# Patient Record
Sex: Male | Born: 1965 | Race: White | Hispanic: No | Marital: Married | Smoking: Never smoker
Health system: Southern US, Community
[De-identification: ages and names within clinical notes are randomized; demographics above are authoritative.]

## PROBLEM LIST (undated history)

## (undated) DIAGNOSIS — M1990 Unspecified osteoarthritis, unspecified site: Secondary | ICD-10-CM

## (undated) DIAGNOSIS — R569 Unspecified convulsions: Secondary | ICD-10-CM

## (undated) DIAGNOSIS — G4700 Insomnia, unspecified: Secondary | ICD-10-CM

## (undated) DIAGNOSIS — G4733 Obstructive sleep apnea (adult) (pediatric): Secondary | ICD-10-CM

## (undated) DIAGNOSIS — E785 Hyperlipidemia, unspecified: Secondary | ICD-10-CM

## (undated) DIAGNOSIS — G20A1 Parkinson's disease without dyskinesia, without mention of fluctuations: Secondary | ICD-10-CM

## (undated) DIAGNOSIS — R590 Localized enlarged lymph nodes: Secondary | ICD-10-CM

## (undated) DIAGNOSIS — G20 Parkinson's disease: Secondary | ICD-10-CM

## (undated) DIAGNOSIS — F32A Depression, unspecified: Secondary | ICD-10-CM

## (undated) DIAGNOSIS — F419 Anxiety disorder, unspecified: Secondary | ICD-10-CM

## (undated) DIAGNOSIS — M5430 Sciatica, unspecified side: Secondary | ICD-10-CM

## (undated) DIAGNOSIS — M4310 Spondylolisthesis, site unspecified: Secondary | ICD-10-CM

## (undated) DIAGNOSIS — M4800 Spinal stenosis, site unspecified: Secondary | ICD-10-CM

## (undated) DIAGNOSIS — F329 Major depressive disorder, single episode, unspecified: Secondary | ICD-10-CM

## (undated) HISTORY — PX: BACK SURGERY: SHX140

## (undated) HISTORY — PX: KNEE ARTHROSCOPY: SUR90

## (undated) HISTORY — PX: TONSILECTOMY/ADENOIDECTOMY WITH MYRINGOTOMY: SHX6125

---

## 1998-08-06 ENCOUNTER — Ambulatory Visit (HOSPITAL_BASED_OUTPATIENT_CLINIC_OR_DEPARTMENT_OTHER): Admission: RE | Admit: 1998-08-06 | Discharge: 1998-08-06 | Payer: Self-pay | Admitting: Orthopedic Surgery

## 2000-06-15 ENCOUNTER — Encounter: Payer: Self-pay | Admitting: Neurosurgery

## 2000-06-17 ENCOUNTER — Ambulatory Visit (HOSPITAL_COMMUNITY): Admission: RE | Admit: 2000-06-17 | Discharge: 2000-06-17 | Payer: Self-pay | Admitting: Neurosurgery

## 2000-12-12 ENCOUNTER — Ambulatory Visit (HOSPITAL_COMMUNITY): Admission: RE | Admit: 2000-12-12 | Discharge: 2000-12-12 | Payer: Self-pay | Admitting: Neurosurgery

## 2000-12-12 ENCOUNTER — Encounter: Payer: Self-pay | Admitting: Neurosurgery

## 2001-01-13 ENCOUNTER — Ambulatory Visit (HOSPITAL_COMMUNITY): Admission: RE | Admit: 2001-01-13 | Discharge: 2001-01-14 | Payer: Self-pay | Admitting: Neurosurgery

## 2001-09-01 ENCOUNTER — Ambulatory Visit (HOSPITAL_BASED_OUTPATIENT_CLINIC_OR_DEPARTMENT_OTHER): Admission: RE | Admit: 2001-09-01 | Discharge: 2001-09-01 | Payer: Self-pay | Admitting: Orthopedic Surgery

## 2001-12-06 ENCOUNTER — Ambulatory Visit (HOSPITAL_BASED_OUTPATIENT_CLINIC_OR_DEPARTMENT_OTHER): Admission: RE | Admit: 2001-12-06 | Discharge: 2001-12-06 | Payer: Self-pay | Admitting: Orthopedic Surgery

## 2001-12-10 ENCOUNTER — Encounter: Payer: Self-pay | Admitting: Neurosurgery

## 2001-12-14 ENCOUNTER — Ambulatory Visit (HOSPITAL_COMMUNITY): Admission: RE | Admit: 2001-12-14 | Discharge: 2001-12-14 | Payer: Self-pay | Admitting: Neurosurgery

## 2002-04-27 ENCOUNTER — Encounter: Payer: Self-pay | Admitting: Emergency Medicine

## 2002-04-27 ENCOUNTER — Emergency Department (HOSPITAL_COMMUNITY): Admission: EM | Admit: 2002-04-27 | Discharge: 2002-04-28 | Payer: Self-pay | Admitting: Emergency Medicine

## 2002-04-28 ENCOUNTER — Encounter: Payer: Self-pay | Admitting: Emergency Medicine

## 2002-07-11 ENCOUNTER — Encounter: Admission: RE | Admit: 2002-07-11 | Discharge: 2002-07-11 | Payer: Self-pay | Admitting: Infectious Diseases

## 2002-07-14 ENCOUNTER — Encounter: Admission: RE | Admit: 2002-07-14 | Discharge: 2002-07-14 | Payer: Self-pay | Admitting: Infectious Diseases

## 2002-07-14 ENCOUNTER — Ambulatory Visit (HOSPITAL_COMMUNITY): Admission: RE | Admit: 2002-07-14 | Discharge: 2002-07-14 | Payer: Self-pay | Admitting: Infectious Diseases

## 2002-07-14 ENCOUNTER — Encounter: Payer: Self-pay | Admitting: Infectious Diseases

## 2002-07-19 ENCOUNTER — Encounter: Payer: Self-pay | Admitting: Internal Medicine

## 2002-07-19 ENCOUNTER — Encounter: Admission: RE | Admit: 2002-07-19 | Discharge: 2002-07-19 | Payer: Self-pay | Admitting: Internal Medicine

## 2002-07-21 ENCOUNTER — Encounter: Admission: RE | Admit: 2002-07-21 | Discharge: 2002-07-21 | Payer: Self-pay | Admitting: Internal Medicine

## 2002-07-21 ENCOUNTER — Encounter: Payer: Self-pay | Admitting: Internal Medicine

## 2002-08-15 ENCOUNTER — Encounter: Admission: RE | Admit: 2002-08-15 | Discharge: 2002-08-15 | Payer: Self-pay | Admitting: Infectious Diseases

## 2005-10-10 ENCOUNTER — Ambulatory Visit: Payer: Self-pay | Admitting: Family Medicine

## 2006-03-05 ENCOUNTER — Inpatient Hospital Stay: Payer: Self-pay | Admitting: Otolaryngology

## 2006-10-01 ENCOUNTER — Ambulatory Visit: Payer: Self-pay | Admitting: Otolaryngology

## 2006-10-13 ENCOUNTER — Ambulatory Visit: Payer: Self-pay | Admitting: Otolaryngology

## 2006-11-04 ENCOUNTER — Ambulatory Visit: Payer: Self-pay | Admitting: Otolaryngology

## 2011-03-11 ENCOUNTER — Other Ambulatory Visit: Payer: Self-pay | Admitting: Neurosurgery

## 2011-03-14 ENCOUNTER — Ambulatory Visit
Admission: RE | Admit: 2011-03-14 | Discharge: 2011-03-14 | Disposition: A | Discharge status: Home / Self Care | Payer: BC Managed Care – PPO | Source: Ambulatory Visit | Attending: Neurosurgery | Admitting: Neurosurgery

## 2011-03-14 MED ORDER — IOHEXOL 300 MG/ML  SOLN: 125.0000 mL | Freq: Once | INTRAMUSCULAR | Status: AC | PRN

## 2011-12-08 ENCOUNTER — Ambulatory Visit: Payer: Self-pay | Admitting: Family Medicine

## 2012-10-07 ENCOUNTER — Ambulatory Visit: Payer: Self-pay | Admitting: Neurology

## 2012-10-11 ENCOUNTER — Ambulatory Visit: Payer: Self-pay | Admitting: Neurology

## 2014-01-23 ENCOUNTER — Ambulatory Visit: Payer: Self-pay | Admitting: Otolaryngology

## 2014-01-23 IMAGING — RF DG BARIUM SWALLOW
7 series · 14 of 24 positions shown · non-contrast
Comparison: None.

CLINICAL DATA: Chronic cough.

EXAM:
ESOPHOGRAM/BARIUM SWALLOW
TECHNIQUE: Combined double contrast and single contrast examination performed
using effervescent crystals, thick barium liquid, and thin barium
liquid.
FLUOROSCOPY TIME:  1 min and 42 seconds.

[Series 1: fluoro_barium 2fps_bw · 0.17mm/px · 2 of 24 frames shown (1 of 6)]
[frame 4/24]
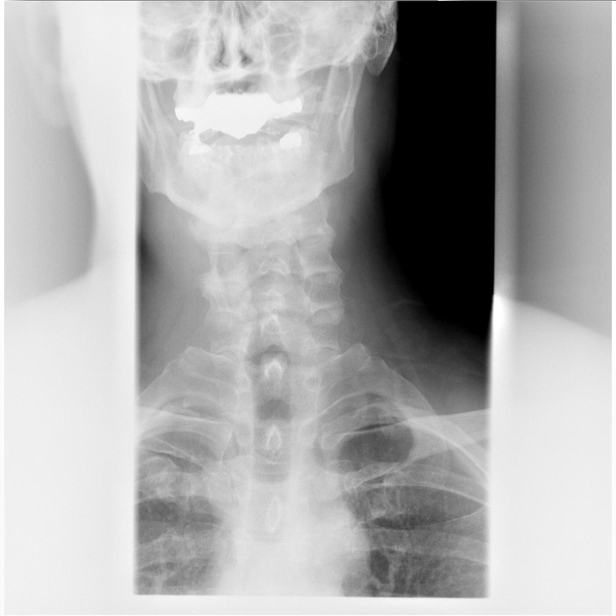
[frame 21/24]
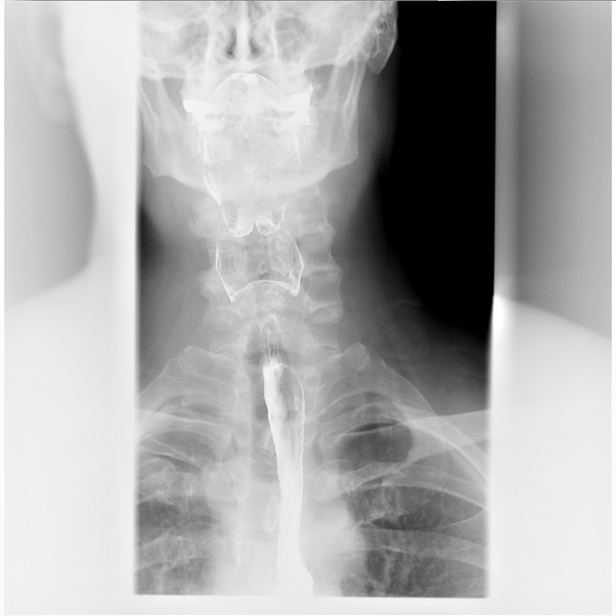

[Series 2: fluoro_barium 2fps_bw · 0.17mm/px · 1 of 13 frames shown (2 of 6)]
[frame 7/13]
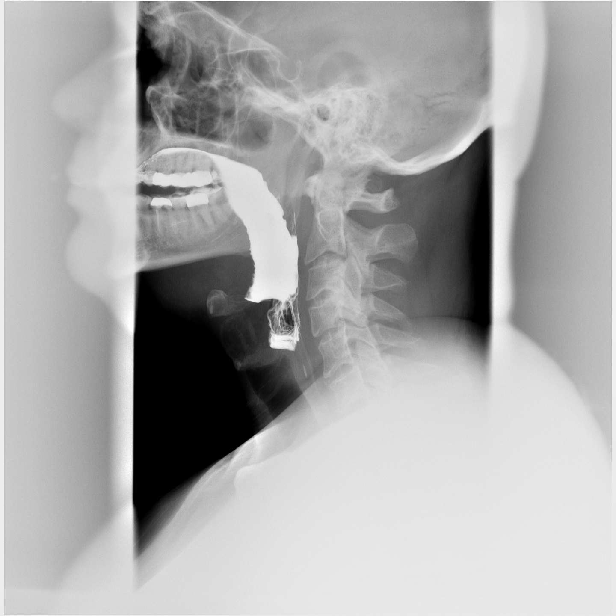

[Series 3: fluoro_barium 2fps_bw · 0.17mm/px · 2 of 13 frames shown (3 of 6)]
[frame 2/13]
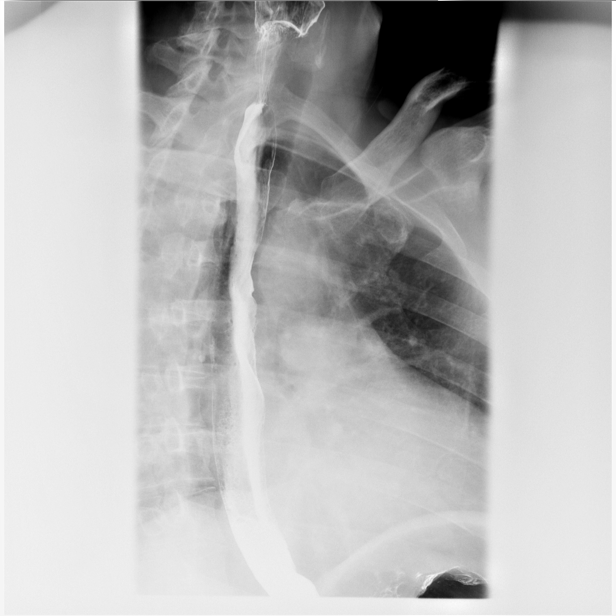
[frame 6/13]
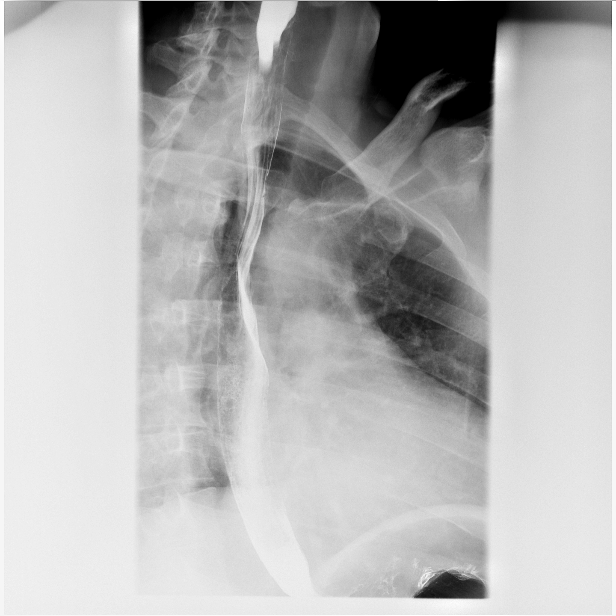

[Series 4: fluoro_barium 2fps_bw · 0.17mm/px · 2 of 18 frames shown (4 of 6)]
[frame 3/18]
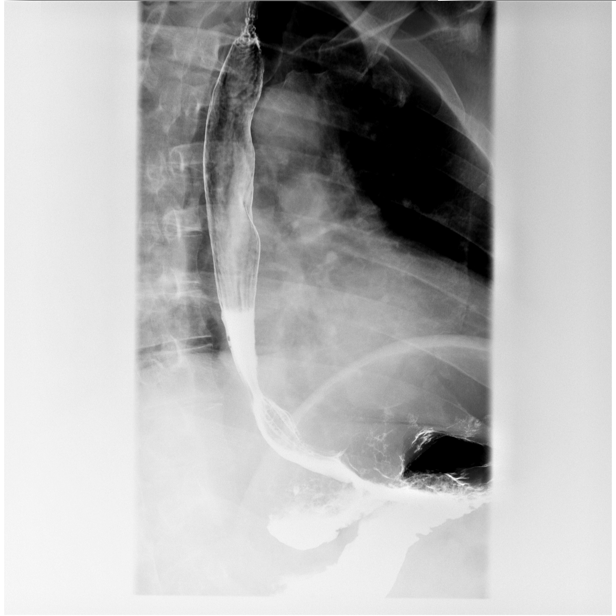
[frame 16/18]
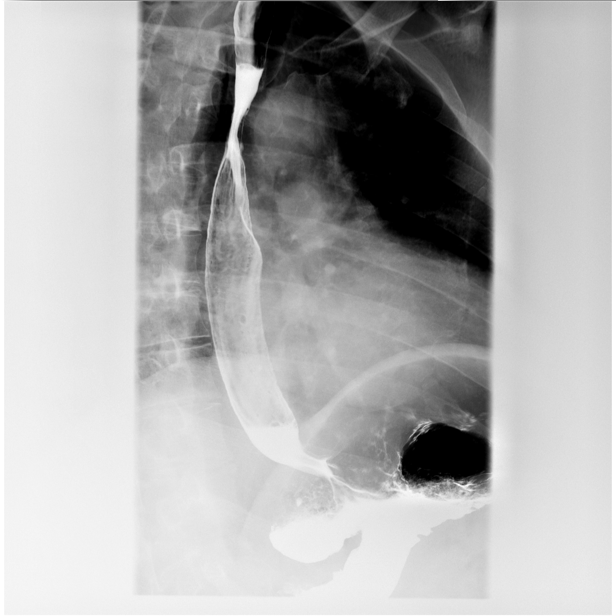

[Series 5: cp_standard · 0.30mm/px · 5 of 11 slices shown]
[im 1/11]
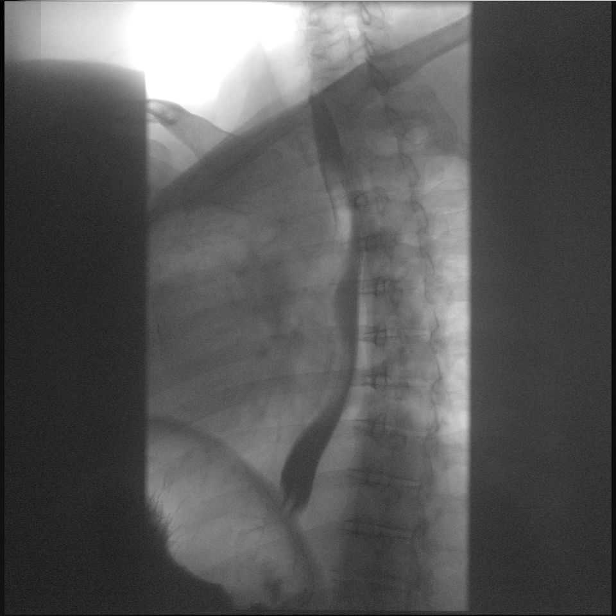
[im 3/11]
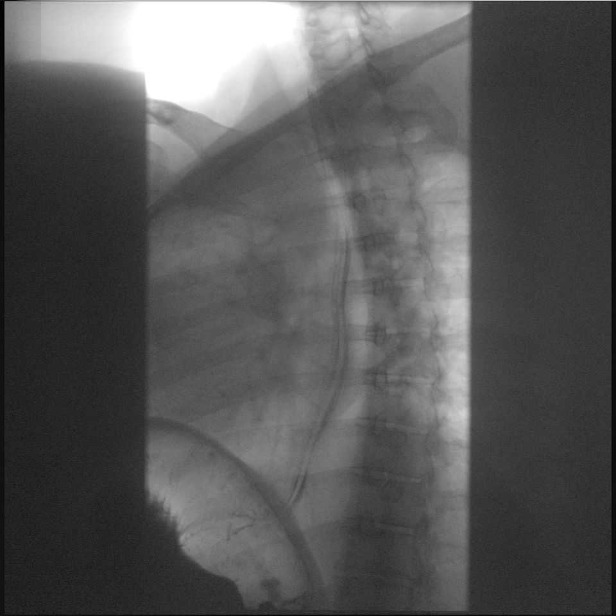
[im 6/11]
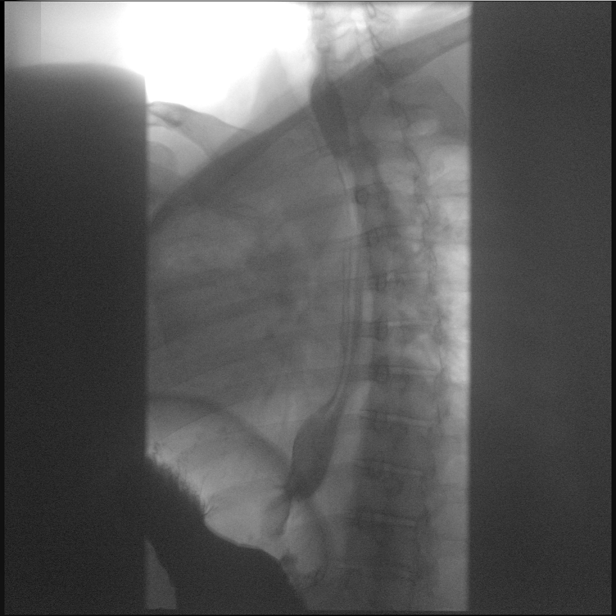
[im 8/11]
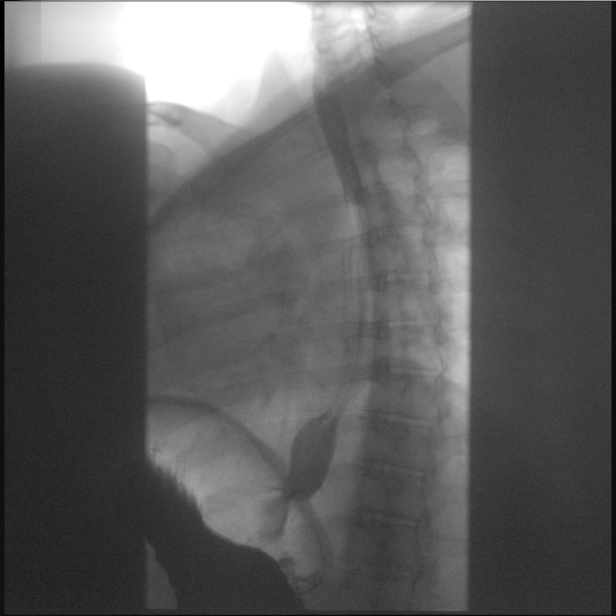
[im 10/11]
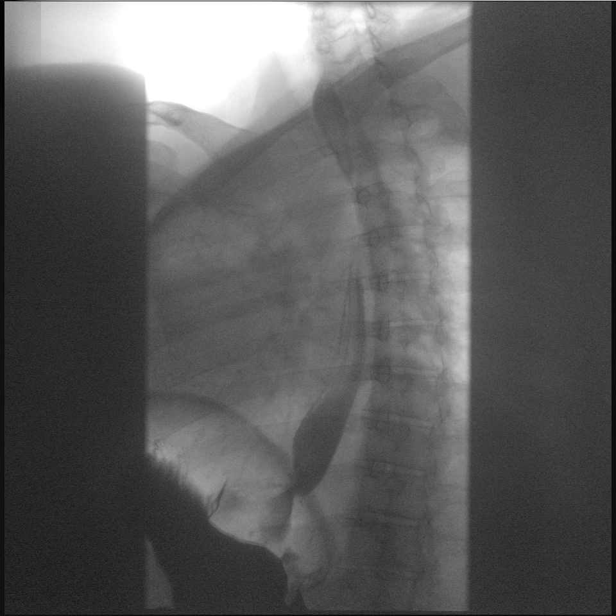

[Series 13: fluoro_barium 2fps_bw · 0.20mm/px · 1 of 2 frames shown (5 of 6)]
[frame 1/2]
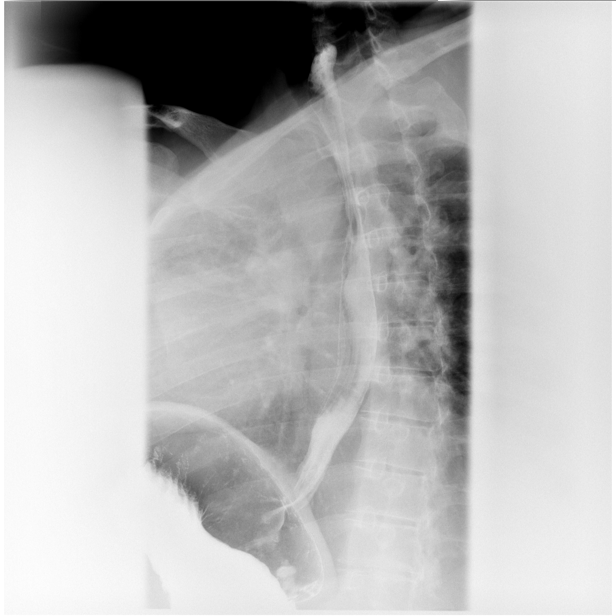

[Series 14: fluoro_barium 2fps_bw · 0.20mm/px · 1 of 2 frames shown (6 of 6)]
[frame 2/2]
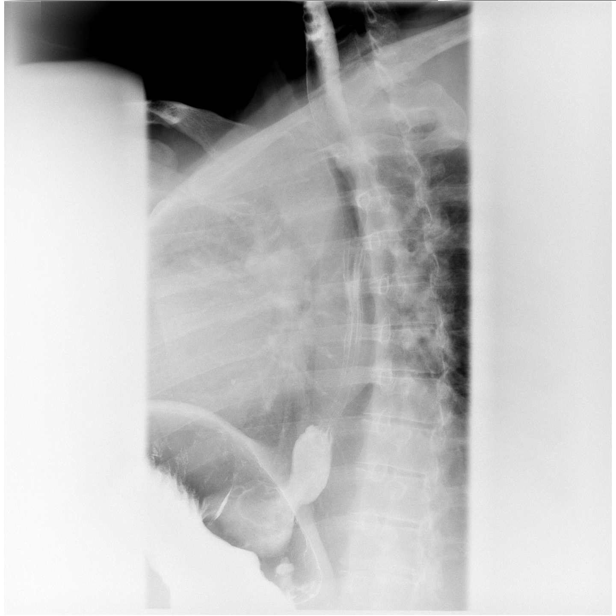

[14 of 24 positions shown; findings below may reference images not displayed]

FINDINGS: Frontal and lateral views of the hypopharynx while swallowing are
normal. Specifically, no evidence for laryngeal penetration or
aspiration. Double contrast imaging of the esophagus is normal
without evidence for diverticulum, mass lesion, mucosal ulceration,
or wall irregularity.

Assessment of esophageal motility shows disruption of primary
peristalsis on multiple swallows, consistent with nonspecific
esophageal motility disorder. No evidence for esophageal fold
thickening to suggest esophagitis.

13 mm barium tablet passes readily into the stomach when taken with
water.
IMPRESSION: Nonspecific esophageal motility disorder. Otherwise unremarkable
exam.

## 2014-02-07 ENCOUNTER — Ambulatory Visit: Payer: Self-pay | Admitting: Otolaryngology

## 2015-01-16 ENCOUNTER — Ambulatory Visit (INDEPENDENT_AMBULATORY_CARE_PROVIDER_SITE_OTHER): Payer: BC Managed Care – PPO | Admitting: Psychology

## 2015-01-16 DIAGNOSIS — F4323 Adjustment disorder with mixed anxiety and depressed mood: Secondary | ICD-10-CM

## 2015-01-24 ENCOUNTER — Ambulatory Visit (INDEPENDENT_AMBULATORY_CARE_PROVIDER_SITE_OTHER): Payer: BC Managed Care – PPO | Admitting: Psychology

## 2015-01-24 DIAGNOSIS — F4323 Adjustment disorder with mixed anxiety and depressed mood: Secondary | ICD-10-CM | POA: Diagnosis not present

## 2015-02-07 ENCOUNTER — Ambulatory Visit: Payer: BC Managed Care – PPO | Admitting: Psychology

## 2015-12-26 ENCOUNTER — Ambulatory Visit: Payer: Self-pay | Admitting: Hematology and Oncology

## 2017-07-08 ENCOUNTER — Ambulatory Visit: Payer: BC Managed Care – PPO | Attending: Neurology

## 2017-07-08 DIAGNOSIS — G4733 Obstructive sleep apnea (adult) (pediatric): Secondary | ICD-10-CM | POA: Insufficient documentation

## 2017-08-03 DIAGNOSIS — I208 Other forms of angina pectoris: Secondary | ICD-10-CM | POA: Diagnosis present

## 2017-08-05 ENCOUNTER — Encounter
Admission: RE | Disposition: A | Discharge status: Home / Self Care | Payer: Self-pay | Source: Ambulatory Visit | Attending: Internal Medicine

## 2017-08-05 ENCOUNTER — Encounter: Payer: Self-pay | Admitting: *Deleted

## 2017-08-05 ENCOUNTER — Ambulatory Visit
Admission: RE | Admit: 2017-08-05 | Discharge: 2017-08-05 | Disposition: A | Discharge status: Home / Self Care | Payer: BC Managed Care – PPO | Source: Ambulatory Visit | Attending: Internal Medicine | Admitting: Internal Medicine

## 2017-08-05 DIAGNOSIS — E669 Obesity, unspecified: Secondary | ICD-10-CM | POA: Insufficient documentation

## 2017-08-05 DIAGNOSIS — M48062 Spinal stenosis, lumbar region with neurogenic claudication: Secondary | ICD-10-CM | POA: Diagnosis not present

## 2017-08-05 DIAGNOSIS — F419 Anxiety disorder, unspecified: Secondary | ICD-10-CM | POA: Insufficient documentation

## 2017-08-05 DIAGNOSIS — R079 Chest pain, unspecified: Secondary | ICD-10-CM

## 2017-08-05 DIAGNOSIS — E7800 Pure hypercholesterolemia, unspecified: Secondary | ICD-10-CM | POA: Insufficient documentation

## 2017-08-05 DIAGNOSIS — G4733 Obstructive sleep apnea (adult) (pediatric): Secondary | ICD-10-CM | POA: Insufficient documentation

## 2017-08-05 DIAGNOSIS — R569 Unspecified convulsions: Secondary | ICD-10-CM | POA: Insufficient documentation

## 2017-08-05 DIAGNOSIS — M1990 Unspecified osteoarthritis, unspecified site: Secondary | ICD-10-CM | POA: Diagnosis not present

## 2017-08-05 DIAGNOSIS — F329 Major depressive disorder, single episode, unspecified: Secondary | ICD-10-CM | POA: Insufficient documentation

## 2017-08-05 DIAGNOSIS — I208 Other forms of angina pectoris: Secondary | ICD-10-CM | POA: Diagnosis present

## 2017-08-05 DIAGNOSIS — Z6832 Body mass index (BMI) 32.0-32.9, adult: Secondary | ICD-10-CM | POA: Diagnosis not present

## 2017-08-05 DIAGNOSIS — R0789 Other chest pain: Secondary | ICD-10-CM | POA: Diagnosis not present

## 2017-08-05 DIAGNOSIS — Z8249 Family history of ischemic heart disease and other diseases of the circulatory system: Secondary | ICD-10-CM | POA: Diagnosis not present

## 2017-08-05 HISTORY — DX: Sciatica, unspecified side: M54.30

## 2017-08-05 HISTORY — DX: Anxiety disorder, unspecified: F41.9

## 2017-08-05 HISTORY — DX: Spondylolisthesis, site unspecified: M43.10

## 2017-08-05 HISTORY — PX: LEFT HEART CATH AND CORONARY ANGIOGRAPHY: CATH118249

## 2017-08-05 HISTORY — DX: Unspecified osteoarthritis, unspecified site: M19.90

## 2017-08-05 HISTORY — DX: Depression, unspecified: F32.A

## 2017-08-05 HISTORY — DX: Major depressive disorder, single episode, unspecified: F32.9

## 2017-08-05 HISTORY — DX: Insomnia, unspecified: G47.00

## 2017-08-05 HISTORY — DX: Obstructive sleep apnea (adult) (pediatric): G47.33

## 2017-08-05 HISTORY — DX: Localized enlarged lymph nodes: R59.0

## 2017-08-05 HISTORY — DX: Spinal stenosis, site unspecified: M48.00

## 2017-08-05 HISTORY — DX: Unspecified convulsions: R56.9

## 2017-08-05 HISTORY — DX: Hyperlipidemia, unspecified: E78.5

## 2017-08-05 SURGERY — LEFT HEART CATH AND CORONARY ANGIOGRAPHY
Anesthesia: Moderate Sedation

## 2017-08-05 MED ORDER — ACETAMINOPHEN 325 MG PO TABS: 650.0000 mg | ORAL_TABLET | ORAL | Status: DC | PRN

## 2017-08-05 MED ORDER — HEPARIN (PORCINE) IN NACL 2-0.9 UNIT/ML-% IJ SOLN
INTRAMUSCULAR | Status: AC
  Filled 2017-08-05: qty 500

## 2017-08-05 MED ORDER — ONDANSETRON HCL 4 MG/2ML IJ SOLN: 4.0000 mg | Freq: Four times a day (QID) | INTRAMUSCULAR | Status: DC | PRN

## 2017-08-05 MED ORDER — MIDAZOLAM HCL 2 MG/2ML IJ SOLN
INTRAMUSCULAR | Status: AC
  Filled 2017-08-05: qty 2

## 2017-08-05 MED ORDER — IOPAMIDOL (ISOVUE-300) INJECTION 61%
INTRAVENOUS | Status: DC | PRN
  Administered 2017-08-05: 85 mL via INTRA_ARTERIAL

## 2017-08-05 MED ORDER — FENTANYL CITRATE (PF) 100 MCG/2ML IJ SOLN
INTRAMUSCULAR | Status: DC | PRN
  Administered 2017-08-05: 25 ug via INTRAVENOUS

## 2017-08-05 MED ORDER — SODIUM CHLORIDE 0.9% FLUSH: 3.0000 mL | Freq: Two times a day (BID) | INTRAVENOUS | Status: DC

## 2017-08-05 MED ORDER — ASPIRIN 81 MG PO CHEW
CHEWABLE_TABLET | ORAL | Status: AC
  Filled 2017-08-05: qty 1

## 2017-08-05 MED ORDER — ASPIRIN 81 MG PO CHEW
81.0000 mg | CHEWABLE_TABLET | ORAL | Status: AC
  Administered 2017-08-05: 81 mg via ORAL

## 2017-08-05 MED ORDER — SODIUM CHLORIDE 0.9 % WEIGHT BASED INFUSION: 1.0000 mL/kg/h | INTRAVENOUS | Status: DC

## 2017-08-05 MED ORDER — MIDAZOLAM HCL 2 MG/2ML IJ SOLN
INTRAMUSCULAR | Status: DC | PRN
  Administered 2017-08-05: 1 mg via INTRAVENOUS

## 2017-08-05 MED ORDER — LIDOCAINE HCL (PF) 1 % IJ SOLN
INTRAMUSCULAR | Status: AC
  Filled 2017-08-05: qty 30

## 2017-08-05 MED ORDER — FENTANYL CITRATE (PF) 100 MCG/2ML IJ SOLN
INTRAMUSCULAR | Status: AC
  Filled 2017-08-05: qty 2

## 2017-08-05 MED ORDER — SODIUM CHLORIDE 0.9 % WEIGHT BASED INFUSION
3.0000 mL/kg/h | INTRAVENOUS | Status: AC
  Administered 2017-08-05: 3 mL/kg/h via INTRAVENOUS

## 2017-08-05 MED ORDER — SODIUM CHLORIDE 0.9% FLUSH: 3.0000 mL | INTRAVENOUS | Status: DC | PRN

## 2017-08-05 MED ORDER — SODIUM CHLORIDE 0.9 % IV SOLN: 250.0000 mL | INTRAVENOUS | Status: DC | PRN

## 2017-08-05 SURGICAL SUPPLY — 10 items
CATH 5FR JL4 DIAGNOSTIC (CATHETERS) ×2 IMPLANT
CATH 5FR PIGTAIL DIAGNOSTIC (CATHETERS) ×2 IMPLANT
CATH INFINITI JR4 5F (CATHETERS) ×2 IMPLANT
DEVICE CLOSURE MYNXGRIP 5F (Vascular Products) ×2 IMPLANT
KIT MANI 3VAL PERCEP (MISCELLANEOUS) ×3 IMPLANT
NDL PERC 18GX7CM (NEEDLE) IMPLANT
NEEDLE PERC 18GX7CM (NEEDLE) ×3 IMPLANT
PACK CARDIAC CATH (CUSTOM PROCEDURE TRAY) ×3 IMPLANT
SHEATH PINNACLE 5F 10CM (SHEATH) ×2 IMPLANT
WIRE EMERALD 3MM-J .035X150CM (WIRE) ×2 IMPLANT

## 2017-08-05 NOTE — Discharge Instructions (Signed)
Groin Insertion Instructions-If you lose feeling or develop tingling or pain in your leg or foot after the procedure, please walk around first.  If the discomfort does not improve , contact your physician and proceed to the nearest emergency room.  Loss of feeling in your leg might mean that a blockage has formed in the artery and this can be appropriately treated.  Limit your activity for the next two days after your procedure.  Avoid stooping, bending, heavy lifting or exertion as this may put pressure on the insertion site.  Resume normal activities in 48 hours.  You may shower after 24 hours but avoid excessive warm water and do not scrub the site.  Remove clear dressing in 48 hours.  If you have had a closure device inserted, do not soak in a tub bath or a hot tub for at least one week. ° °No driving for 48 hours after discharge.  After the procedure, check the insertion site occasionally.  If any oozing occurs or there is apparent swelling, firm pressure over the site will prevent a bruise from forming.  You can not hurt anything by pressing directly on the site.  The pressure stops the bleeding by allowing a small clot to form.  If the bleeding continues after the pressure has been applied for more than 15 minutes, call 911 or go to the nearest emergency room.   ° °The x-ray dye causes you to pass a considerate amount of urine.  For this reason, you will be asked to drink plenty of liquids after the procedure to prevent dehydration.  You may resume you regular diet.  Avoid caffeine products.   ° °For pain at the site of your procedure, take non-aspirin medicines such as Tylenol. ° °Medications: A. Hold Metformin for 48 hours if applicable.  B. Continue taking all your present medications at home unless your doctor prescribes any changes. ° °Moderate Conscious Sedation, Adult, Care After °These instructions provide you with information about caring for yourself after your procedure. Your health care provider  may also give you more specific instructions. Your treatment has been planned according to current medical practices, but problems sometimes occur. Call your health care provider if you have any problems or questions after your procedure. °What can I expect after the procedure? °After your procedure, it is common: °· To feel sleepy for several hours. °· To feel clumsy and have poor balance for several hours. °· To have poor judgment for several hours. °· To vomit if you eat too soon. ° °Follow these instructions at home: °For at least 24 hours after the procedure: ° °· Do not: °? Participate in activities where you could fall or become injured. °? Drive. °? Use heavy machinery. °? Drink alcohol. °? Take sleeping pills or medicines that cause drowsiness. °? Make important decisions or sign legal documents. °? Take care of children on your own. °· Rest. °Eating and drinking °· Follow the diet recommended by your health care provider. °· If you vomit: °? Drink water, juice, or soup when you can drink without vomiting. °? Make sure you have little or no nausea before eating solid foods. °General instructions °· Have a responsible adult stay with you until you are awake and alert. °· Take over-the-counter and prescription medicines only as told by your health care provider. °· If you smoke, do not smoke without supervision. °· Keep all follow-up visits as told by your health care provider. This is important. °Contact a health care provider   if: °· You keep feeling nauseous or you keep vomiting. °· You feel light-headed. °· You develop a rash. °· You have a fever. °Get help right away if: °· You have trouble breathing. °This information is not intended to replace advice given to you by your health care provider. Make sure you discuss any questions you have with your health care provider. °Document Released: 06/22/2013 Document Revised: 02/04/2016 Document Reviewed: 12/22/2015 °Elsevier Interactive Patient Education © 2018  [employer] ° °

## 2017-09-10 ENCOUNTER — Other Ambulatory Visit: Payer: Self-pay | Admitting: Family Medicine

## 2017-09-10 DIAGNOSIS — R590 Localized enlarged lymph nodes: Secondary | ICD-10-CM

## 2017-09-10 DIAGNOSIS — E7800 Pure hypercholesterolemia, unspecified: Secondary | ICD-10-CM

## 2017-09-23 ENCOUNTER — Ambulatory Visit
Admission: RE | Admit: 2017-09-23 | Discharge: 2017-09-23 | Disposition: A | Discharge status: Home / Self Care | Payer: BC Managed Care – PPO | Source: Ambulatory Visit | Attending: Family Medicine | Admitting: Family Medicine

## 2017-09-23 DIAGNOSIS — Z981 Arthrodesis status: Secondary | ICD-10-CM | POA: Diagnosis not present

## 2017-09-23 DIAGNOSIS — E7800 Pure hypercholesterolemia, unspecified: Secondary | ICD-10-CM | POA: Diagnosis not present

## 2017-09-23 DIAGNOSIS — R590 Localized enlarged lymph nodes: Secondary | ICD-10-CM | POA: Diagnosis not present

## 2017-09-23 MED ORDER — IOPAMIDOL (ISOVUE-300) INJECTION 61%
100.0000 mL | Freq: Once | INTRAVENOUS | Status: AC | PRN
  Administered 2017-09-23: 100 mL via INTRAVENOUS

## 2017-10-02 ENCOUNTER — Other Ambulatory Visit: Payer: Self-pay | Admitting: Internal Medicine

## 2017-10-02 DIAGNOSIS — R1011 Right upper quadrant pain: Secondary | ICD-10-CM

## 2017-10-20 ENCOUNTER — Ambulatory Visit
Admission: RE | Admit: 2017-10-20 | Discharge: 2017-10-20 | Disposition: A | Discharge status: Home / Self Care | Payer: BC Managed Care – PPO | Source: Ambulatory Visit | Attending: Internal Medicine | Admitting: Internal Medicine

## 2017-10-20 DIAGNOSIS — R1011 Right upper quadrant pain: Secondary | ICD-10-CM | POA: Diagnosis present

## 2017-10-20 MED ORDER — TECHNETIUM TC 99M MEBROFENIN IV KIT
4.9900 | PACK | Freq: Once | INTRAVENOUS | Status: AC | PRN
  Administered 2017-10-20: 4.99 via INTRAVENOUS

## 2018-06-21 ENCOUNTER — Other Ambulatory Visit: Payer: Self-pay | Admitting: Psychiatry

## 2018-06-28 ENCOUNTER — Other Ambulatory Visit: Payer: Self-pay | Admitting: Psychiatry

## 2018-07-04 DIAGNOSIS — F411 Generalized anxiety disorder: Secondary | ICD-10-CM

## 2018-07-04 DIAGNOSIS — F4310 Post-traumatic stress disorder, unspecified: Secondary | ICD-10-CM | POA: Insufficient documentation

## 2018-07-15 ENCOUNTER — Encounter: Payer: Self-pay | Admitting: Psychiatry

## 2018-07-15 ENCOUNTER — Ambulatory Visit: Payer: BC Managed Care – PPO | Admitting: Psychiatry

## 2018-07-15 DIAGNOSIS — F411 Generalized anxiety disorder: Secondary | ICD-10-CM

## 2018-07-15 DIAGNOSIS — F331 Major depressive disorder, recurrent, moderate: Secondary | ICD-10-CM | POA: Diagnosis not present

## 2018-07-15 DIAGNOSIS — F4310 Post-traumatic stress disorder, unspecified: Secondary | ICD-10-CM | POA: Diagnosis not present

## 2018-07-15 NOTE — Progress Notes (Signed)
Dakota Davis [id number] [birth year] 52 y.o.  Subjective:   Patient ID:  Dakota Davis is a 52 y.o. (DOB 23-Aug-1966) male.  Chief Complaint:  Chief Complaint  Patient presents with  . Follow-up    depression and anxiety    HPI Dakota Davis presents to the office today for follow-up of depression and anxiety. Doing pretty wells. Patient reports stable mood and denies depressed or irritable moods.  Patient denies any recent difficulty with anxiety.  Patient denies unusual difficulty with sleep initiation but awakens a lot. Interrupted with back pain. Meds don't seem to help pain. Some daytime drowsiness, not driving.  Naps some bc he can.  Denies appetite disturbance.  Patient reports that energy and motivation have been good.  Patient denies any difficulty with concentration.  Patient denies any suicidal ideation.  Couple diet cokes as late as 8 pm.  Service dog for son 2 weeks ago has helped everyone.  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  Review of Systems:  Review of Systems  Musculoskeletal: Positive for back pain.  Neurological: Negative for tremors and weakness.  Psychiatric/Behavioral: Negative for agitation, behavioral problems, confusion, decreased concentration, dysphoric mood, hallucinations, self-injury, sleep disturbance and suicidal ideas. The patient is not nervous/anxious and is not hyperactive.     Medications: I have reviewed the patient's current medications.  Current Outpatient Medications  Medication Sig Dispense Refill  . ARIPiprazole (ABILIFY) 10 MG tablet TAKE ONE TABLET BY MOUTH EVERY DAY 90 tablet 0  . ibuprofen (ADVIL,MOTRIN) 200 MG tablet Take 400 mg every 8 (eight) hours as needed by mouth for mild pain or moderate pain.    . rosuvastatin (CRESTOR) 5 MG tablet Take 5 mg daily by mouth.    . sertraline (ZOLOFT) 100 MG tablet TAKE TWO TABLETS BY MOUTH EVERY DAY 180 tablet 0   No current facility-administered medications for this visit.      Medication Side Effects: None  Allergies:  Allergies  Allergen Reactions  . Adhesive [Tape] Other (See Comments)    Blisters   . Codeine Nausea And Vomiting  . Latex Other (See Comments)    blisters    Past Medical History:  Diagnosis Date  . Anxiety   . Anxiety   . Arthritis   . Depression   . Hyperlipidemia   . Insomnia   . Lymphadenopathy, retroperitoneal   . OSA (obstructive sleep apnea)   . Sciatica   . Seizures (HCC)   . Spinal stenosis   . Spinal stenosis   . Spondylisthesis     History reviewed. No pertinent family history.  Social History   Socioeconomic History  . Marital status: Married    Spouse name: Not on file  . Number of children: Not on file  . Years of education: Not on file  . Highest education level: Not on file  Occupational History  . Not on file  Social Needs  . Financial resource strain: Not on file  . Food insecurity:    Worry: Not on file    Inability: Not on file  . Transportation needs:    Medical: Not on file    Non-medical: Not on file  Tobacco Use  . Smoking status: Never Smoker  . Smokeless tobacco: Never Used  Substance and Sexual Activity  . Alcohol use: No    Frequency: Never  . Drug use: No  . Sexual activity: Yes  Lifestyle  . Physical activity:    Days per week: Not on file    Minutes per  session: Not on file  . Stress: Not on file  Relationships  . Social connections:    Talks on phone: Not on file    Gets together: Not on file    Attends religious service: Not on file    Active member of club or organization: Not on file    Attends meetings of clubs or organizations: Not on file    Relationship status: Not on file  . Intimate partner violence:    Fear of current or ex partner: Not on file    Emotionally abused: Not on file    Physically abused: Not on file    Forced sexual activity: Not on file  Other Topics Concern  . Not on file  Social History Narrative  . Not on file    Past Medical  History, Surgical history, Social history, and Family history were reviewed and updated as appropriate.    Youngest son sz disorder. Please see review of systems for further details on the patient's review from today.   Objective:   Physical Exam:  There were no vitals taken for this visit.  Physical Exam  Constitutional: He is oriented to person, place, and time. He appears well-developed. No distress.  Musculoskeletal: He exhibits no deformity.  Neurological: He is alert and oriented to person, place, and time. He displays no tremor. Coordination and gait normal.  Psychiatric: He has a normal mood and affect. His speech is normal and behavior is normal. Judgment and thought content normal. His mood appears not anxious. His affect is not angry, not blunt, not labile and not inappropriate. Cognition and memory are normal. He does not exhibit a depressed mood. He expresses no homicidal and no suicidal ideation. He expresses no suicidal plans and no homicidal plans.  Insight intact. No auditory or visual hallucinations. No delusions.   No movement disorder.  Lab Review:  No results found for: NA, K, CL, CO2, GLUCOSE, BUN, CREATININE, CALCIUM, PROT, ALBUMIN, AST, ALT, ALKPHOS, BILITOT, GFRNONAA, GFRAA  No results found for: WBC, RBC, HGB, HCT, PLT, MCV, MCH, MCHC, RDW, LYMPHSABS, MONOABS, EOSABS, BASOSABS  No results found for: POCLITH, LITHIUM   No results found for: PHENYTOIN, PHENOBARB, VALPROATE, CBMZ   .res Assessment: Plan:    Generalized anxiety disorder  PTSD (post-traumatic stress disorder)  Major depressive disorder, recurrent episode, moderate (HCC)  Please see After Visit Summary for patient specific instructions.  Greater than 50% of face to face time with patient was spent on counseling and coordination of care. We discussed  Sleep hygiene including restriction, dark room, caffeine, etc.   Discussed potential metabolic side effects associated with atypical  antipsychotics, as well as potential risk for movement side effects. Advised pt to contact office if movement side effects occur.  Getting good response to meds.  He has no further questions  FU 6 mos  15 min appt today  Meredith Staggers, Md, DFAPA     No future appointments.  No orders of the defined types were placed in this encounter.     -------------------------------

## 2018-09-20 ENCOUNTER — Other Ambulatory Visit: Payer: Self-pay | Admitting: Psychiatry

## 2019-01-13 ENCOUNTER — Ambulatory Visit: Payer: BC Managed Care – PPO | Admitting: Psychiatry

## 2019-03-22 ENCOUNTER — Other Ambulatory Visit: Payer: Self-pay | Admitting: Psychiatry

## 2019-06-30 ENCOUNTER — Other Ambulatory Visit: Payer: Self-pay | Admitting: Psychiatry

## 2019-07-12 ENCOUNTER — Other Ambulatory Visit: Payer: Self-pay

## 2019-07-12 ENCOUNTER — Ambulatory Visit (INDEPENDENT_AMBULATORY_CARE_PROVIDER_SITE_OTHER): Payer: BC Managed Care – PPO | Admitting: Psychiatry

## 2019-07-12 ENCOUNTER — Encounter: Payer: Self-pay | Admitting: Psychiatry

## 2019-07-12 DIAGNOSIS — F4310 Post-traumatic stress disorder, unspecified: Secondary | ICD-10-CM

## 2019-07-12 DIAGNOSIS — F331 Major depressive disorder, recurrent, moderate: Secondary | ICD-10-CM | POA: Diagnosis not present

## 2019-07-12 DIAGNOSIS — F5105 Insomnia due to other mental disorder: Secondary | ICD-10-CM

## 2019-07-12 DIAGNOSIS — F411 Generalized anxiety disorder: Secondary | ICD-10-CM | POA: Diagnosis not present

## 2019-07-12 MED ORDER — ZALEPLON 10 MG PO CAPS: 10.0000 mg | ORAL_CAPSULE | Freq: Every evening | ORAL | 2 refills | Status: DC | PRN

## 2019-07-12 MED ORDER — ARIPIPRAZOLE 10 MG PO TABS: 10.0000 mg | ORAL_TABLET | Freq: Every day | ORAL | 1 refills | Status: DC

## 2019-07-12 MED ORDER — SERTRALINE HCL 100 MG PO TABS: 200.0000 mg | ORAL_TABLET | Freq: Every day | ORAL | 1 refills | Status: DC

## 2019-07-12 NOTE — Progress Notes (Signed)
Dakota Davis [id number] 03/12/1966 53 y.o.  Subjective:   Patient ID:  Dakota Davis is a 53 y.o. (DOB 07/26/1966) male.  Chief Complaint:  Chief Complaint  Patient presents with  . Follow-up    Medication Management  . Anxiety    Medication Management  . Medication Refill    Abilify    HPI Dakota Davis presents to the office today for follow-up of generalized anxiety disorder with elements of PTSD and major depression.  Last seen October 2019.  No meds were changed.  Stayed healthy.  Pretty well with mental health.  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  Patient reports stable mood and denies depressed or irritable moods.  Patient with residual anxiety which sometimes interferes with sleep.  2-3 days a week can be hard to shut off brain.  Occ EFA.Marland Kitchen Usually 7-8 hour of sleep.  OCc daytime tiredness. Denies appetite disturbance.  Patient reports that energy and motivation have been good.  Patient denies any difficulty with concentration.  Patient denies any suicidal ideation.   Past Psychiatric Medication Trials: Sertraline 200, Abilify 10, buspirone 30 twice daily, doxazosin 8, venlafaxine 225, paroxetine  Review of Systems:  Review of Systems  Musculoskeletal: Positive for back pain.  Neurological: Negative for tremors and weakness.    Medications: I have reviewed the patient's current medications.  Current Outpatient Medications  Medication Sig Dispense Refill  . ARIPiprazole (ABILIFY) 10 MG tablet Take 1 tablet (10 mg total) by mouth daily. 90 tablet 1  . ibuprofen (ADVIL,MOTRIN) 200 MG tablet Take 400 mg every 8 (eight) hours as needed by mouth for mild pain or moderate pain.    . rosuvastatin (CRESTOR) 5 MG tablet Take 5 mg daily by mouth.    . sertraline (ZOLOFT) 100 MG tablet Take 2 tablets (200 mg total) by mouth daily. 180 tablet 1  . zaleplon (SONATA) 10 MG capsule Take 1 capsule (10 mg total) by mouth at  bedtime as needed for sleep. 20 capsule 2   No current facility-administered medications for this visit.     Medication Side Effects: None  Allergies:  Allergies  Allergen Reactions  . Adhesive [Tape] Other (See Comments)    Blisters   . Codeine Nausea And Vomiting  . Latex Other (See Comments)    blisters    Past Medical History:  Diagnosis Date  . Anxiety   . Anxiety   . Arthritis   . Depression   . Hyperlipidemia   . Insomnia   . Lymphadenopathy, retroperitoneal   . OSA (obstructive sleep apnea)   . Sciatica   . Seizures (HCC)   . Spinal stenosis   . Spinal stenosis   . Spondylisthesis     History reviewed. No pertinent family history.  Social History   Socioeconomic History  . Marital status: Married    Spouse name: Not on file  . Number of children: Not on file  . Years of education: Not on file  . Highest education level: Not on file  Occupational History  . Not on file  Social Needs  . Financial resource strain: Not on file  . Food insecurity    Worry: Not on file    Inability: Not on file  . Transportation needs    Medical: Not on file    Non-medical: Not on file  Tobacco Use  . Smoking status: Never Smoker  . Smokeless tobacco: Never Used  Substance and Sexual Activity  .  Alcohol use: No    Frequency: Never  . Drug use: No  . Sexual activity: Yes  Lifestyle  . Physical activity    Days per week: Not on file    Minutes per session: Not on file  . Stress: Not on file  Relationships  . Social Musicianconnections    Talks on phone: Not on file    Gets together: Not on file    Attends religious service: Not on file    Active member of club or organization: Not on file    Attends meetings of clubs or organizations: Not on file    Relationship status: Not on file  . Intimate partner violence    Fear of current or ex partner: Not on file    Emotionally abused: Not on file    Physically abused: Not on file    Forced sexual activity: Not on file   Other Topics Concern  . Not on file  Social History Narrative  . Not on file    Past Medical History, Surgical history, Social history, and Family history were reviewed and updated as appropriate.   Please see review of systems for further details on the patient's review from today.   Objective:   Physical Exam:  There were no vitals taken for this visit.  Physical Exam Constitutional:      General: He is not in acute distress.    Appearance: He is well-developed. He is obese.  Musculoskeletal:        General: No deformity.  Neurological:     Mental Status: He is alert and oriented to person, place, and time.     Coordination: Coordination normal.  Psychiatric:        Attention and Perception: Attention and perception normal. He does not perceive auditory or visual hallucinations.        Mood and Affect: Mood is anxious. Mood is not depressed. Affect is not labile, blunt, angry or inappropriate.        Speech: Speech normal.        Behavior: Behavior normal.        Thought Content: Thought content normal. Thought content does not include homicidal or suicidal ideation. Thought content does not include homicidal or suicidal plan.        Cognition and Memory: Cognition and memory normal.        Judgment: Judgment normal.     Comments: Insight intact. No delusions.      Lab Review:  No results found for: NA, K, CL, CO2, GLUCOSE, BUN, CREATININE, CALCIUM, PROT, ALBUMIN, AST, ALT, ALKPHOS, BILITOT, GFRNONAA, GFRAA  No results found for: WBC, RBC, HGB, HCT, PLT, MCV, MCH, MCHC, RDW, LYMPHSABS, MONOABS, EOSABS, BASOSABS  No results found for: POCLITH, LITHIUM   No results found for: PHENYTOIN, PHENOBARB, VALPROATE, CBMZ   .res Assessment: Plan:    Dakota Davis was seen today for follow-up, anxiety and medication refill.  Diagnoses and all orders for this visit:  Generalized anxiety disorder -     sertraline (ZOLOFT) 100 MG tablet; Take 2 tablets (200 mg total) by mouth  daily. -     ARIPiprazole (ABILIFY) 10 MG tablet; Take 1 tablet (10 mg total) by mouth daily.  PTSD (post-traumatic stress disorder) -     sertraline (ZOLOFT) 100 MG tablet; Take 2 tablets (200 mg total) by mouth daily. -     ARIPiprazole (ABILIFY) 10 MG tablet; Take 1 tablet (10 mg total) by mouth daily.  Major depressive disorder, recurrent episode,  moderate (HCC) -     sertraline (ZOLOFT) 100 MG tablet; Take 2 tablets (200 mg total) by mouth daily. -     ARIPiprazole (ABILIFY) 10 MG tablet; Take 1 tablet (10 mg total) by mouth daily.  Insomnia due to mental condition -     zaleplon (SONATA) 10 MG capsule; Take 1 capsule (10 mg total) by mouth at bedtime as needed for sleep.   Discussed potential metabolic side effects associated with atypical antipsychotics, as well as potential risk for movement side effects. Advised pt to contact office if movement side effects occur.   Option trazodone or Sonata for insomnia 2-3 times week.  Disc both in detail and SE.  Disc pros and cons of sleep meds and risks including risks without meds from sleep deprivation.   Disc SE in detail and SSRI withdrawal sx.  No med changes indicated.  FU 6 mos.  Lynder Parents, MD, DFAPA    Please see After Visit Summary for patient specific instructions.  No future appointments.  No orders of the defined types were placed in this encounter.   -------------------------------

## 2019-12-26 ENCOUNTER — Other Ambulatory Visit: Payer: Self-pay | Admitting: Psychiatry

## 2019-12-26 DIAGNOSIS — F331 Major depressive disorder, recurrent, moderate: Secondary | ICD-10-CM

## 2019-12-26 DIAGNOSIS — F4310 Post-traumatic stress disorder, unspecified: Secondary | ICD-10-CM

## 2019-12-26 DIAGNOSIS — F411 Generalized anxiety disorder: Secondary | ICD-10-CM

## 2020-01-10 ENCOUNTER — Ambulatory Visit: Payer: BC Managed Care – PPO | Admitting: Psychiatry

## 2020-01-11 ENCOUNTER — Other Ambulatory Visit: Payer: Self-pay

## 2020-01-11 ENCOUNTER — Encounter: Payer: Self-pay | Admitting: Psychiatry

## 2020-01-11 ENCOUNTER — Ambulatory Visit (INDEPENDENT_AMBULATORY_CARE_PROVIDER_SITE_OTHER): Payer: BC Managed Care – PPO | Admitting: Psychiatry

## 2020-01-11 DIAGNOSIS — F5105 Insomnia due to other mental disorder: Secondary | ICD-10-CM

## 2020-01-11 DIAGNOSIS — F3341 Major depressive disorder, recurrent, in partial remission: Secondary | ICD-10-CM

## 2020-01-11 DIAGNOSIS — F411 Generalized anxiety disorder: Secondary | ICD-10-CM

## 2020-01-11 DIAGNOSIS — F4310 Post-traumatic stress disorder, unspecified: Secondary | ICD-10-CM

## 2020-01-11 MED ORDER — BREXPIPRAZOLE 2 MG PO TABS: 2.0000 mg | ORAL_TABLET | Freq: Every day | ORAL | 1 refills | Status: DC

## 2020-01-11 NOTE — Progress Notes (Signed)
Dakota Davis [id number] [birth year] 54 y.o.  Subjective:   Patient ID:  Dakota Davis is a 54 y.o. (DOB Dec 27, 1965) male.  Chief Complaint:  Chief Complaint  Patient presents with  . Follow-up    Mood anxiety and sleep  . Depression    HPI Dakota Davis presents to the office today for follow-up of generalized anxiety disorder with elements of PTSD and major depression.  Last seen October 2020.  No meds were changed.  He remained on sertraline 200, aripiprazole 10 mg and Sonata or trazodone as needed insomnia.  January 11, 2020 appointment the following is noted: Had Covid Feb with flu-like sx and fatigue lasted several days. At times feels he's slipping into a bit more depression with less motivation and isolating.  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  Patient reports stable mood and denies depressed or irritable moods.  Patient with residual anxiety which sometimes interferes with sleep.  Sonata helps prn.  2-3 days a week can be hard to shut off brain.  Occ EFA.Marland Kitchen Usually 7-8 hour of sleep.  OCc daytime tiredness. Denies appetite disturbance.  Patient reports that energy and motivation have been good.  Patient denies any difficulty with concentration.  Patient denies any suicidal ideation.  Past Psychiatric Medication Trials: Sertraline 200, Abilify 10, buspirone 30 twice daily, doxazosin 8, venlafaxine 225, paroxetine Sonata  Review of Systems:  Review of Systems  Musculoskeletal: Positive for back pain.  Neurological: Negative for dizziness, tremors and weakness.    Medications: I have reviewed the patient's current medications.  Current Outpatient Medications  Medication Sig Dispense Refill  . ibuprofen (ADVIL,MOTRIN) 200 MG tablet Take 400 mg every 8 (eight) hours as needed by mouth for mild pain or moderate pain.    . rosuvastatin (CRESTOR) 5 MG tablet Take 5 mg daily by mouth.    . sertraline (ZOLOFT) 100 MG tablet  TAKE 2 TABLETS BY MOUTH DAILY 180 tablet 0  . zaleplon (SONATA) 10 MG capsule Take 1 capsule (10 mg total) by mouth at bedtime as needed for sleep. 20 capsule 2  . brexpiprazole (REXULTI) 2 MG TABS tablet Take 1 tablet (2 mg total) by mouth daily. 30 tablet 1   No current facility-administered medications for this visit.    Medication Side Effects: None  Allergies:  Allergies  Allergen Reactions  . Adhesive [Tape] Other (See Comments)    Blisters   . Codeine Nausea And Vomiting  . Latex Other (See Comments)    blisters    Past Medical History:  Diagnosis Date  . Anxiety   . Anxiety   . Arthritis   . Depression   . Hyperlipidemia   . Insomnia   . Lymphadenopathy, retroperitoneal   . OSA (obstructive sleep apnea)   . Sciatica   . Seizures (HCC)   . Spinal stenosis   . Spinal stenosis   . Spondylisthesis     History reviewed. No pertinent family history.  Social History   Socioeconomic History  . Marital status: Married    Spouse name: Not on file  . Number of children: Not on file  . Years of education: Not on file  . Highest education level: Not on file  Occupational History  . Not on file  Tobacco Use  . Smoking status: Never Smoker  . Smokeless tobacco: Never Used  Substance and Sexual Activity  . Alcohol use: No  . Drug use: No  . Sexual activity: Yes  Other Topics Concern  . Not on file  Social History Narrative  . Not on file   Social Determinants of Health   Financial Resource Strain:   . Difficulty of Paying Living Expenses:   Food Insecurity:   . Worried About Charity fundraiser in the Last Year:   . Arboriculturist in the Last Year:   Transportation Needs:   . Film/video editor (Medical):   Marland Kitchen Lack of Transportation (Non-Medical):   Physical Activity:   . Days of Exercise per Week:   . Minutes of Exercise per Session:   Stress:   . Feeling of Stress :   Social Connections:   . Frequency of Communication with Friends and Family:    . Frequency of Social Gatherings with Friends and Family:   . Attends Religious Services:   . Active Member of Clubs or Organizations:   . Attends Archivist Meetings:   Marland Kitchen Marital Status:   Intimate Partner Violence:   . Fear of Current or Ex-Partner:   . Emotionally Abused:   Marland Kitchen Physically Abused:   . Sexually Abused:     Past Medical History, Surgical history, Social history, and Family history were reviewed and updated as appropriate.   Please see review of systems for further details on the patient's review from today.   Objective:   Physical Exam:  There were no vitals taken for this visit.  Physical Exam Constitutional:      General: He is not in acute distress.    Appearance: He is well-developed. He is obese.  Musculoskeletal:        General: No deformity.  Neurological:     Mental Status: He is alert and oriented to person, place, and time.     Coordination: Coordination normal.  Psychiatric:        Attention and Perception: Attention and perception normal. He does not perceive auditory or visual hallucinations.        Mood and Affect: Mood is depressed. Mood is not anxious. Affect is not labile, blunt, angry or inappropriate.        Speech: Speech normal.        Behavior: Behavior normal.        Thought Content: Thought content normal. Thought content does not include homicidal or suicidal ideation. Thought content does not include homicidal or suicidal plan.        Cognition and Memory: Cognition and memory normal.        Judgment: Judgment normal.     Comments: Insight intact. No delusions.      Lab Review:  No results found for: NA, K, CL, CO2, GLUCOSE, BUN, CREATININE, CALCIUM, PROT, ALBUMIN, AST, ALT, ALKPHOS, BILITOT, GFRNONAA, GFRAA  No results found for: WBC, RBC, HGB, HCT, PLT, MCV, MCH, MCHC, RDW, LYMPHSABS, MONOABS, EOSABS, BASOSABS  No results found for: POCLITH, LITHIUM   No results found for: PHENYTOIN, PHENOBARB, VALPROATE, CBMZ    .res Assessment: Plan:    Dakota Davis was seen today for follow-up and depression.  Diagnoses and all orders for this visit:  Depression, major, recurrent, in partial remission ([place]) -     brexpiprazole (REXULTI) 2 MG TABS tablet; Take 1 tablet (2 mg total) by mouth daily.  Generalized anxiety disorder  PTSD (post-traumatic stress disorder)  Insomnia due to mental condition   Discussed potential metabolic side effects associated with atypical antipsychotics, as well as potential risk for movement side effects. Advised pt to contact office if movement side  effects occur.  Consider switch Abilify to Rexulti bc is a little more depressed than usual.  Consider switch from sertraline to Trintellix as it is often a better antidepressant however it is often an inferior antianxiety medicine.   He'd like to try Rexulti 2 mg daily  Option trazodone or Sonata for insomnia 2-3 times week.  Disc both in detail and SE.  Disc pros and cons of sleep meds and risks including risks without meds from sleep deprivation.   Disc SE in detail and SSRI withdrawal sx.  No med changes indicated.  FU 2 mos.  Meredith Staggers, MD, DFAPA    Please see After Visit Summary for patient specific instructions.  No future appointments.  No orders of the defined types were placed in this encounter.   -------------------------------

## 2020-01-12 ENCOUNTER — Telehealth: Payer: Self-pay

## 2020-01-12 NOTE — Telephone Encounter (Signed)
Prior authorization submitted and approved for REXULTI 2 MG tablets effective 01/12/2020-01/12/2023 with [facility]   Sent through cover my meds

## 2020-03-09 ENCOUNTER — Telehealth: Payer: Self-pay | Admitting: Psychiatry

## 2020-03-09 ENCOUNTER — Other Ambulatory Visit: Payer: Self-pay

## 2020-03-09 DIAGNOSIS — F331 Major depressive disorder, recurrent, moderate: Secondary | ICD-10-CM

## 2020-03-09 DIAGNOSIS — F4310 Post-traumatic stress disorder, unspecified: Secondary | ICD-10-CM

## 2020-03-09 DIAGNOSIS — F411 Generalized anxiety disorder: Secondary | ICD-10-CM

## 2020-03-09 DIAGNOSIS — F3341 Major depressive disorder, recurrent, in partial remission: Secondary | ICD-10-CM

## 2020-03-09 MED ORDER — BREXPIPRAZOLE 2 MG PO TABS: 2.0000 mg | ORAL_TABLET | Freq: Every day | ORAL | 2 refills | Status: DC

## 2020-03-09 MED ORDER — SERTRALINE HCL 100 MG PO TABS: 200.0000 mg | ORAL_TABLET | Freq: Every day | ORAL | 0 refills | Status: DC

## 2020-03-09 NOTE — Telephone Encounter (Signed)
Pt requesting refills on Sertraline and any others due. Fill at the [facility]. He is scheduled for 8/30 a RS due to provider being out.

## 2020-03-09 NOTE — Telephone Encounter (Signed)
Rx's for sertraline and rexulti sent to pharmacy

## 2020-03-13 ENCOUNTER — Ambulatory Visit: Payer: BC Managed Care – PPO | Admitting: Psychiatry

## 2020-05-14 ENCOUNTER — Other Ambulatory Visit: Payer: Self-pay

## 2020-05-14 ENCOUNTER — Ambulatory Visit (INDEPENDENT_AMBULATORY_CARE_PROVIDER_SITE_OTHER): Payer: BC Managed Care – PPO | Admitting: Psychiatry

## 2020-05-14 ENCOUNTER — Encounter: Payer: Self-pay | Admitting: Psychiatry

## 2020-05-14 DIAGNOSIS — F4310 Post-traumatic stress disorder, unspecified: Secondary | ICD-10-CM

## 2020-05-14 DIAGNOSIS — F411 Generalized anxiety disorder: Secondary | ICD-10-CM

## 2020-05-14 DIAGNOSIS — F331 Major depressive disorder, recurrent, moderate: Secondary | ICD-10-CM

## 2020-05-14 DIAGNOSIS — F5105 Insomnia due to other mental disorder: Secondary | ICD-10-CM

## 2020-05-14 MED ORDER — REXULTI 4 MG PO TABS: 1.0000 | ORAL_TABLET | Freq: Every day | ORAL | 0 refills | Status: DC

## 2020-05-14 NOTE — Progress Notes (Signed)
Dakota Davis [id number] [birth year] 54 y.o.  Subjective:   Patient ID:  Dakota Davis is a 54 y.o. (DOB 18-May-1966) male.  Chief Complaint:  Chief Complaint  Patient presents with  . Follow-up    Medication Management  . Depression    Medication Management  . Anxiety    Medication Management    Depression        Past medical history includes anxiety.   Anxiety Patient reports no dizziness or palpitations.     Dakota Davis presents to the office today for follow-up of generalized anxiety disorder with elements of PTSD and major depression.  Last seen October 2020.  No meds were changed.  He remained on sertraline 200, aripiprazole 10 mg and Sonata or trazodone as needed insomnia.  January 11, 2020 appointment the following is noted: Had Covid Feb with flu-like sx and fatigue lasted several days. At times feels he's slipping into a bit more depression with less motivation and isolating.  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  Patient reports stable mood and denies depressed or irritable moods.  Patient with residual anxiety which sometimes interferes with sleep.  Sonata helps prn.  2-3 days a week can be hard to shut off brain.  Occ EFA.Marland Kitchen Usually 7-8 hour of sleep.  OCc daytime tiredness. Denies appetite disturbance.  Patient reports that energy and motivation have been good.  Patient denies any difficulty with concentration.  Patient denies any suicidal ideation. Plan:  He wanted to try Rexulti 2 mg augmentation  05/14/20 appt with the following noted: Rexulti for 2 mos helped but cost $250/mo copay and couldn't afford.  Felt worse off of it. Anxiety out the roof with uncertain cause. Seems worse than ever.  Varying subjects for worry.  Can't seem to get a hold of it.  Wife notices he's distant for weeks. Rexulti helped significantly.    Past Psychiatric Medication Trials: Sertraline 200, venlafaxine 225, paroxetine Abilify  10, Rexulti 2,  buspirone 30 twice daily, doxazosin 8,  Sonata  Review of Systems:  Review of Systems  Cardiovascular: Negative for palpitations.  Musculoskeletal: Positive for back pain.  Neurological: Negative for dizziness, tremors and weakness.  Psychiatric/Behavioral: Positive for depression.    Medications: I have reviewed the patient's current medications.  Current Outpatient Medications  Medication Sig Dispense Refill  . ibuprofen (ADVIL,MOTRIN) 200 MG tablet Take 400 mg every 8 (eight) hours as needed by mouth for mild pain or moderate pain.    . rosuvastatin (CRESTOR) 5 MG tablet Take 5 mg daily by mouth.    . sertraline (ZOLOFT) 100 MG tablet Take 2 tablets (200 mg total) by mouth daily. 180 tablet 0  . zaleplon (SONATA) 10 MG capsule Take 1 capsule (10 mg total) by mouth at bedtime as needed for sleep. 20 capsule 2  . brexpiprazole (REXULTI) 2 MG TABS tablet Take 1 tablet (2 mg total) by mouth daily. (Patient not taking: Reported on 05/14/2020) 30 tablet 2  . Brexpiprazole (REXULTI) 4 MG TABS Take 1 tablet by mouth daily in the afternoon. 15 tablet 0   No current facility-administered medications for this visit.    Medication Side Effects: None  Allergies:  Allergies  Allergen Reactions  . Adhesive [Tape] Other (See Comments)    Blisters   . Codeine Nausea And Vomiting  . Latex Other (See Comments)    blisters    Past Medical History:  Diagnosis Date  . Anxiety   .  Anxiety   . Arthritis   . Depression   . Hyperlipidemia   . Insomnia   . Lymphadenopathy, retroperitoneal   . OSA (obstructive sleep apnea)   . Sciatica   . Seizures (HCC)   . Spinal stenosis   . Spinal stenosis   . Spondylisthesis     History reviewed. No pertinent family history.  Social History   Socioeconomic History  . Marital status: Married    Spouse name: Not on file  . Number of children: Not on file  . Years of education: Not on file  . Highest education level: Not on  file  Occupational History  . Not on file  Tobacco Use  . Smoking status: Never Smoker  . Smokeless tobacco: Never Used  Substance and Sexual Activity  . Alcohol use: No  . Drug use: No  . Sexual activity: Yes  Other Topics Concern  . Not on file  Social History Narrative  . Not on file   Social Determinants of Health   Financial Resource Strain:   . Difficulty of Paying Living Expenses: Not on file  Food Insecurity:   . Worried About Programme researcher, broadcasting/film/video in the Last Year: Not on file  . Ran Out of Food in the Last Year: Not on file  Transportation Needs:   . Lack of Transportation (Medical): Not on file  . Lack of Transportation (Non-Medical): Not on file  Physical Activity:   . Days of Exercise per Week: Not on file  . Minutes of Exercise per Session: Not on file  Stress:   . Feeling of Stress : Not on file  Social Connections:   . Frequency of Communication with Friends and Family: Not on file  . Frequency of Social Gatherings with Friends and Family: Not on file  . Attends Religious Services: Not on file  . Active Member of Clubs or Organizations: Not on file  . Attends Banker Meetings: Not on file  . Marital Status: Not on file  Intimate Partner Violence:   . Fear of Current or Ex-Partner: Not on file  . Emotionally Abused: Not on file  . Physically Abused: Not on file  . Sexually Abused: Not on file    Past Medical History, Surgical history, Social history, and Family history were reviewed and updated as appropriate.   Please see review of systems for further details on the patient's review from today.   Objective:   Physical Exam:  There were no vitals taken for this visit.  Physical Exam Constitutional:      General: He is not in acute distress.    Appearance: He is well-developed. He is obese.  Musculoskeletal:        General: No deformity.  Neurological:     Mental Status: He is alert and oriented to person, place, and time.      Coordination: Coordination normal.  Psychiatric:        Attention and Perception: Attention and perception normal. He does not perceive auditory or visual hallucinations.        Mood and Affect: Mood is anxious and depressed. Affect is not labile, blunt, angry or inappropriate.        Speech: Speech normal.        Behavior: Behavior normal.        Thought Content: Thought content normal. Thought content does not include homicidal or suicidal ideation. Thought content does not include homicidal or suicidal plan.  Cognition and Memory: Cognition and memory normal.        Judgment: Judgment normal.     Comments: Insight intact. No delusions.  Anxiety much worse.     Lab Review:  No results found for: NA, K, CL, CO2, GLUCOSE, BUN, CREATININE, CALCIUM, PROT, ALBUMIN, AST, ALT, ALKPHOS, BILITOT, GFRNONAA, GFRAA  No results found for: WBC, RBC, HGB, HCT, PLT, MCV, MCH, MCHC, RDW, LYMPHSABS, MONOABS, EOSABS, BASOSABS  No results found for: POCLITH, LITHIUM   No results found for: PHENYTOIN, PHENOBARB, VALPROATE, CBMZ   .res Assessment: Plan:    Dakota Davis was seen today for follow-up, depression and anxiety.  Diagnoses and all orders for this visit:  Major depressive disorder, recurrent episode, moderate (HCC) -     Brexpiprazole (REXULTI) 4 MG TABS; Take 1 tablet by mouth daily in the afternoon.  Generalized anxiety disorder -     Brexpiprazole (REXULTI) 4 MG TABS; Take 1 tablet by mouth daily in the afternoon.  PTSD (post-traumatic stress disorder) -     Brexpiprazole (REXULTI) 4 MG TABS; Take 1 tablet by mouth daily in the afternoon.  Insomnia due to mental condition   Discussed potential metabolic side effects associated with atypical antipsychotics, as well as potential risk for movement side effects. Advised pt to contact office if movement side effects occur.  For whatever reason anxiety is worse than ever.  Maybe from stopping Abilify then Rexulti.  Was better with   Rexulti 2 mg daily for a couple of mos but unaffordable even with the card $250 with [facility] and card. Gave samples and will ask rep to see if there's anything that can be done to help him.  Option trazodone or Sonata for insomnia 2-3 times week.  Disc both in detail and SE.  Disc pros and cons of sleep meds and risks including risks without meds from sleep deprivation.   Disc SE in detail and SSRI withdrawal sx.  FU 2 mos.  Meredith Staggers, MD, DFAPA    Please see After Visit Summary for patient specific instructions.  No future appointments.  No orders of the defined types were placed in this encounter.   -------------------------------

## 2020-05-14 NOTE — Patient Instructions (Signed)
Restart Rexulti 2 mg daily

## 2020-06-21 ENCOUNTER — Telehealth: Payer: Self-pay | Admitting: Psychiatry

## 2020-06-21 ENCOUNTER — Other Ambulatory Visit: Payer: Self-pay

## 2020-06-21 DIAGNOSIS — F3341 Major depressive disorder, recurrent, in partial remission: Secondary | ICD-10-CM

## 2020-06-21 MED ORDER — BREXPIPRAZOLE 2 MG PO TABS: 2.0000 mg | ORAL_TABLET | Freq: Every day | ORAL | 2 refills | Status: DC

## 2020-06-21 NOTE — Telephone Encounter (Signed)
Rx for Rexulti 2 mg sent to [facility] [address] [place]

## 2020-06-21 NOTE — Telephone Encounter (Signed)
Pt called to advise he needs Rx for Rexulti (Brexpiprazole) 2 mg 1/d [facility] [address] [place] [address] # [phone]. Only this med to this pharmacy. All other meds to [facility]. Apt 10/27

## 2020-07-11 ENCOUNTER — Other Ambulatory Visit: Payer: Self-pay

## 2020-07-11 ENCOUNTER — Encounter: Payer: Self-pay | Admitting: Psychiatry

## 2020-07-11 ENCOUNTER — Ambulatory Visit (INDEPENDENT_AMBULATORY_CARE_PROVIDER_SITE_OTHER): Payer: BC Managed Care – PPO | Admitting: Psychiatry

## 2020-07-11 DIAGNOSIS — F3341 Major depressive disorder, recurrent, in partial remission: Secondary | ICD-10-CM | POA: Diagnosis not present

## 2020-07-11 DIAGNOSIS — F4310 Post-traumatic stress disorder, unspecified: Secondary | ICD-10-CM

## 2020-07-11 DIAGNOSIS — F411 Generalized anxiety disorder: Secondary | ICD-10-CM | POA: Diagnosis not present

## 2020-07-11 DIAGNOSIS — F5105 Insomnia due to other mental disorder: Secondary | ICD-10-CM | POA: Diagnosis not present

## 2020-07-11 MED ORDER — TRAZODONE HCL 50 MG PO TABS: 50.0000 mg | ORAL_TABLET | Freq: Every day | ORAL | 1 refills | Status: DC

## 2020-07-11 MED ORDER — BREXPIPRAZOLE 2 MG PO TABS: 2.0000 mg | ORAL_TABLET | Freq: Every day | ORAL | 5 refills | Status: DC

## 2020-07-11 MED ORDER — SERTRALINE HCL 100 MG PO TABS: 200.0000 mg | ORAL_TABLET | Freq: Every day | ORAL | 1 refills | Status: DC

## 2020-07-11 NOTE — Progress Notes (Signed)
Dakota Davis [id number] 1966/05/19 54 y.o.  Subjective:   Patient ID:  Dakota Davis is a 54 y.o. (DOB 11-29-1965) male.  Chief Complaint:  Chief Complaint  Patient presents with  . Follow-up  . Anxiety  . Depression  . Sleeping Problem    Depression        Past medical history includes anxiety.   Anxiety Patient reports no dizziness or palpitations.     Dakota Davis presents to the office today for follow-up of generalized anxiety disorder with elements of PTSD and major depression.  seen October 2020.  No meds were changed.  He remained on sertraline 200, aripiprazole 10 mg and Sonata or trazodone as needed insomnia.  January 11, 2020 appointment the following is noted: Had Covid Feb with flu-like sx and fatigue lasted several days. At times feels he's slipping into a bit more depression with less motivation and isolating.  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  Patient reports stable mood and denies depressed or irritable moods.  Patient with residual anxiety which sometimes interferes with sleep.  Sonata helps prn.  2-3 days a week can be hard to shut off brain.  Occ EFA.Marland Kitchen Usually 7-8 hour of sleep.  OCc daytime tiredness. Denies appetite disturbance.  Patient reports that energy and motivation have been good.  Patient denies any difficulty with concentration.  Patient denies any suicidal ideation. Plan:  He wanted to try Rexulti 2 mg augmentation  05/14/20 appt with the following noted: Rexulti for 2 mos helped but cost $250/mo copay and couldn't afford.  Felt worse off of it. Anxiety out the roof with uncertain cause. Seems worse than ever.  Varying subjects for worry.  Can't seem to get a hold of it.  Wife notices he's distant for weeks. Rexulti helped significantly.   Plan: sampled Rexulti  07/11/2020 appointment with the following noted: Been able to stay on Rexulti and is better on it.  Clear difference.  Anxiety  is better and manageable.  Mild depression comes and goes and manageable. No SE. Sleep not great with trouble staying asleep.  EFA with variable times.    Past Psychiatric Medication Trials: Sertraline 200, venlafaxine 225, paroxetine Abilify 10, Rexulti 2,  buspirone 30 twice daily, doxazosin 8,  Sonata  Review of Systems:  Review of Systems  Cardiovascular: Negative for palpitations.  Genitourinary: Positive for frequency.  Musculoskeletal: Positive for back pain.  Neurological: Negative for dizziness, tremors and weakness.  Psychiatric/Behavioral: Positive for depression.    Medications: I have reviewed the patient's current medications.  Current Outpatient Medications  Medication Sig Dispense Refill  . brexpiprazole (REXULTI) 2 MG TABS tablet Take 1 tablet (2 mg total) by mouth daily. 30 tablet 5  . ibuprofen (ADVIL,MOTRIN) 200 MG tablet Take 400 mg every 8 (eight) hours as needed by mouth for mild pain or moderate pain.    . rosuvastatin (CRESTOR) 5 MG tablet Take 5 mg daily by mouth.    . sertraline (ZOLOFT) 100 MG tablet Take 2 tablets (200 mg total) by mouth daily. 180 tablet 1  . traZODone (DESYREL) 50 MG tablet Take 1-2 tablets (50-100 mg total) by mouth at bedtime. 90 tablet 1  . zaleplon (SONATA) 10 MG capsule Take 1 capsule (10 mg total) by mouth at bedtime as needed for sleep. (Patient not taking: Reported on 07/11/2020) 20 capsule 2   No current facility-administered medications for this visit.    Medication Side Effects: None  Allergies:  Allergies  Allergen Reactions  . Adhesive [Tape] Other (See Comments)    Blisters   . Codeine Nausea And Vomiting  . Latex Other (See Comments)    blisters    Past Medical History:  Diagnosis Date  . Anxiety   . Anxiety   . Arthritis   . Depression   . Hyperlipidemia   . Insomnia   . Lymphadenopathy, retroperitoneal   . OSA (obstructive sleep apnea)   . Sciatica   . Seizures (HCC)   . Spinal stenosis   .  Spinal stenosis   . Spondylisthesis     History reviewed. No pertinent family history.  Social History   Socioeconomic History  . Marital status: Married    Spouse name: Not on file  . Number of children: Not on file  . Years of education: Not on file  . Highest education level: Not on file  Occupational History  . Not on file  Tobacco Use  . Smoking status: Never Smoker  . Smokeless tobacco: Never Used  Substance and Sexual Activity  . Alcohol use: No  . Drug use: No  . Sexual activity: Yes  Other Topics Concern  . Not on file  Social History Narrative  . Not on file   Social Determinants of Health   Financial Resource Strain:   . Difficulty of Paying Living Expenses: Not on file  Food Insecurity:   . Worried About Programme researcher, broadcasting/film/video in the Last Year: Not on file  . Ran Out of Food in the Last Year: Not on file  Transportation Needs:   . Lack of Transportation (Medical): Not on file  . Lack of Transportation (Non-Medical): Not on file  Physical Activity:   . Days of Exercise per Week: Not on file  . Minutes of Exercise per Session: Not on file  Stress:   . Feeling of Stress : Not on file  Social Connections:   . Frequency of Communication with Friends and Family: Not on file  . Frequency of Social Gatherings with Friends and Family: Not on file  . Attends Religious Services: Not on file  . Active Member of Clubs or Organizations: Not on file  . Attends Banker Meetings: Not on file  . Marital Status: Not on file  Intimate Partner Violence:   . Fear of Current or Ex-Partner: Not on file  . Emotionally Abused: Not on file  . Physically Abused: Not on file  . Sexually Abused: Not on file    Past Medical History, Surgical history, Social history, and Family history were reviewed and updated as appropriate.   Please see review of systems for further details on the patient's review from today.   Objective:   Physical Exam:  There were no vitals  taken for this visit.  Physical Exam Constitutional:      General: He is not in acute distress.    Appearance: He is well-developed. He is obese.  Musculoskeletal:        General: No deformity.  Neurological:     Mental Status: He is alert and oriented to person, place, and time.     Coordination: Coordination normal.  Psychiatric:        Attention and Perception: Attention and perception normal. He does not perceive auditory or visual hallucinations.        Mood and Affect: Mood is not anxious or depressed. Affect is not labile, blunt, angry or inappropriate.        Speech: Speech  normal.        Behavior: Behavior normal.        Thought Content: Thought content normal. Thought content does not include homicidal or suicidal ideation. Thought content does not include homicidal or suicidal plan.        Cognition and Memory: Cognition and memory normal.        Judgment: Judgment normal.     Comments: Insight intact. No delusions.  Anxiety much better with Rexulti.     Lab Review:  No results found for: NA, K, CL, CO2, GLUCOSE, BUN, CREATININE, CALCIUM, PROT, ALBUMIN, AST, ALT, ALKPHOS, BILITOT, GFRNONAA, GFRAA  No results found for: WBC, RBC, HGB, HCT, PLT, MCV, MCH, MCHC, RDW, LYMPHSABS, MONOABS, EOSABS, BASOSABS  No results found for: POCLITH, LITHIUM   No results found for: PHENYTOIN, PHENOBARB, VALPROATE, CBMZ   .res Assessment: Plan:    Dakota Davis was seen today for follow-up, anxiety, depression and sleeping problem.  Diagnoses and all orders for this visit:  Depression, major, recurrent, in partial remission (HCC) -     brexpiprazole (REXULTI) 2 MG TABS tablet; Take 1 tablet (2 mg total) by mouth daily. -     sertraline (ZOLOFT) 100 MG tablet; Take 2 tablets (200 mg total) by mouth daily.  Insomnia due to mental condition -     traZODone (DESYREL) 50 MG tablet; Take 1-2 tablets (50-100 mg total) by mouth at bedtime.  Generalized anxiety disorder -     sertraline  (ZOLOFT) 100 MG tablet; Take 2 tablets (200 mg total) by mouth daily.  PTSD (post-traumatic stress disorder) -     sertraline (ZOLOFT) 100 MG tablet; Take 2 tablets (200 mg total) by mouth daily.   Discussed potential metabolic side effects associated with atypical antipsychotics, as well as potential risk for movement side effects. Advised pt to contact office if movement side effects occur.  For whatever reason anxiety is worse than ever.  Maybe from stopping Abilify then Rexulti.  Clearly better with Rexulti 2 mg in addition to sertraline 200 mg daily.  Option trazodone or Sonata for insomnia 2-3 times week.  Disc both in detail and SE.  Disc pros and cons of sleep meds and risks including risks without meds from sleep deprivation. Trial trazodone 50-100 mg HS instead of Sonata.  Discussed the risk of priapism and other side effects.   Disc SE in detail and SSRI withdrawal sx.  FU 3 mos.  Meredith Staggers, MD, DFAPA    Please see After Visit Summary for patient specific instructions.  No future appointments.  No orders of the defined types were placed in this encounter.   -------------------------------

## 2020-09-21 ENCOUNTER — Other Ambulatory Visit: Payer: Self-pay | Admitting: Psychiatry

## 2020-09-21 ENCOUNTER — Telehealth: Payer: Self-pay | Admitting: Psychiatry

## 2020-09-21 DIAGNOSIS — F4310 Post-traumatic stress disorder, unspecified: Secondary | ICD-10-CM

## 2020-09-21 DIAGNOSIS — F3341 Major depressive disorder, recurrent, in partial remission: Secondary | ICD-10-CM

## 2020-09-21 DIAGNOSIS — F411 Generalized anxiety disorder: Secondary | ICD-10-CM

## 2020-09-21 MED ORDER — PRAMIPEXOLE DIHYDROCHLORIDE 0.25 MG PO TABS: ORAL_TABLET | ORAL | 1 refills | Status: DC

## 2020-09-21 NOTE — Telephone Encounter (Signed)
Pt called reporting having issue with Rexulti costing too much again. Pt asking you to change med to something else. Pharmacy [facility]. Contact # [phone]. Apt 1/27. Pt is out of meds

## 2020-09-21 NOTE — Progress Notes (Signed)
Patient already failed Abilify.  Had good response to Rexulti but now cannot afford it again.  He is unlikely to be able to afford the other med in this family which is Wellsite geologist.  We will initiated off label trial of a dopamine agonist pramipexole since there is some theoretical benefit for this association.

## 2020-09-21 NOTE — Telephone Encounter (Signed)
Sent in prescription 

## 2020-10-11 ENCOUNTER — Ambulatory Visit (INDEPENDENT_AMBULATORY_CARE_PROVIDER_SITE_OTHER): Payer: BC Managed Care – PPO | Admitting: Psychiatry

## 2020-10-11 ENCOUNTER — Other Ambulatory Visit: Payer: Self-pay

## 2020-10-11 ENCOUNTER — Encounter: Payer: Self-pay | Admitting: Psychiatry

## 2020-10-11 DIAGNOSIS — F5105 Insomnia due to other mental disorder: Secondary | ICD-10-CM | POA: Diagnosis not present

## 2020-10-11 DIAGNOSIS — F411 Generalized anxiety disorder: Secondary | ICD-10-CM

## 2020-10-11 DIAGNOSIS — F3341 Major depressive disorder, recurrent, in partial remission: Secondary | ICD-10-CM

## 2020-10-11 DIAGNOSIS — F4310 Post-traumatic stress disorder, unspecified: Secondary | ICD-10-CM

## 2020-10-11 MED ORDER — SERTRALINE HCL 100 MG PO TABS: 200.0000 mg | ORAL_TABLET | Freq: Every day | ORAL | 1 refills | Status: DC

## 2020-10-11 MED ORDER — PRAMIPEXOLE DIHYDROCHLORIDE 0.25 MG PO TABS: ORAL_TABLET | ORAL | 1 refills | Status: DC

## 2020-10-11 MED ORDER — TRAZODONE HCL 50 MG PO TABS: 50.0000 mg | ORAL_TABLET | Freq: Every day | ORAL | 1 refills | Status: DC

## 2020-10-11 NOTE — Progress Notes (Signed)
Dakota Davis [id number] December 07, 1965 55 y.o.  Subjective:   Patient ID:  Dakota Davis is a 55 y.o. (DOB 07-06-1966) male.  Chief Complaint:  Chief Complaint  Patient presents with  . Follow-up  . Depression  . Anxiety    Depression        Past medical history includes anxiety.   Anxiety Patient reports no dizziness or palpitations.     Dakota Davis presents to the office today for follow-up of generalized anxiety disorder with elements of PTSD and major depression.  seen October 2020.  No meds were changed.  He remained on sertraline 200, aripiprazole 10 mg and Sonata or trazodone as needed insomnia.  January 11, 2020 appointment the following is noted: Had Covid Feb with flu-like sx and fatigue lasted several days. At times feels he's slipping into a bit more depression with less motivation and isolating.  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  Patient reports stable mood and denies depressed or irritable moods.  Patient with residual anxiety which sometimes interferes with sleep.  Sonata helps prn.  2-3 days a week can be hard to shut off brain.  Occ EFA.Marland Kitchen Usually 7-8 hour of sleep.  OCc daytime tiredness. Denies appetite disturbance.  Patient reports that energy and motivation have been good.  Patient denies any difficulty with concentration.  Patient denies any suicidal ideation. Plan:  He wanted to try Rexulti 2 mg augmentation  05/14/20 appt with the following noted: Rexulti for 2 mos helped but cost $250/mo copay and couldn't afford.  Felt worse off of it. Anxiety out the roof with uncertain cause. Seems worse than ever.  Varying subjects for worry.  Can't seem to get a hold of it.  Wife notices he's distant for weeks. Rexulti helped significantly.   Plan: sampled Rexulti  07/11/2020 appointment with the following noted: Been able to stay on Rexulti and is better on it.  Clear difference.  Anxiety is better and  manageable.  Mild depression comes and goes and manageable. No SE. Sleep not great with trouble staying asleep.  EFA with variable times.   Plan: Clearly better with Rexulti 2 mg in addition to sertraline 200 mg daily. Recommend a trial of trazodone at 100 250 mg nightly for insomnia  09/21/2020 phone call from patient stating he could no longer afford Rexulti.  He had already failed Abilify.  He is likely to be unable to afford the other partial dopamine agonist Vraylar.  Therefore initiated off label trial of pramipexole to take the place of the Rexulti.  Patient has a pending appointment in 3 weeks.  10/11/2020 appointment with the following noted: Ran out of Rexulti bc cost with new year was going to be $1000.   Overall still doing fine and has been since last seen.  Not noticed any changes so far with switch from Rexulti to pramipexole. Sleep is better.  Awakens but back to sleep.  Depression and anxiety are manageable. Patient reports stable mood and denies depressed or irritable moods.  Patient denies any recent difficulty with anxiety.  Patient denies difficulty with sleep initiation or maintenance. Denies appetite disturbance.  Patient reports that energy and motivation have been good.  Patient denies any difficulty with concentration.  Patient denies any suicidal ideation. No SE  Past Psychiatric Medication Trials: Sertraline 200, venlafaxine 225, paroxetine Abilify 10, Rexulti 2,  buspirone 30 twice daily, doxazosin 8,  Sonata  Review of Systems:  Review of Systems  Cardiovascular: Negative for palpitations.  Genitourinary: Positive for frequency.  Musculoskeletal: Negative for back pain.  Neurological: Negative for dizziness, tremors and weakness.  Psychiatric/Behavioral: Positive for depression.    Medications: I have reviewed the patient's current medications.  Current Outpatient Medications  Medication Sig Dispense Refill  . ibuprofen (ADVIL,MOTRIN) 200 MG tablet Take 400  mg every 8 (eight) hours as needed by mouth for mild pain or moderate pain.    . rosuvastatin (CRESTOR) 5 MG tablet Take 5 mg daily by mouth.    . pramipexole (MIRAPEX) 0.25 MG tablet 1/2 tablet twice daily for 1 week then 1 tablet twice daily 180 tablet 1  . sertraline (ZOLOFT) 100 MG tablet Take 2 tablets (200 mg total) by mouth daily. 180 tablet 1  . traZODone (DESYREL) 50 MG tablet Take 1-2 tablets (50-100 mg total) by mouth at bedtime. 90 tablet 1   No current facility-administered medications for this visit.    Medication Side Effects: None  Allergies:  Allergies  Allergen Reactions  . Adhesive [Tape] Other (See Comments)    Blisters   . Codeine Nausea And Vomiting  . Latex Other (See Comments)    blisters    Past Medical History:  Diagnosis Date  . Anxiety   . Anxiety   . Arthritis   . Depression   . Hyperlipidemia   . Insomnia   . Lymphadenopathy, retroperitoneal   . OSA (obstructive sleep apnea)   . Sciatica   . Seizures (HCC)   . Spinal stenosis   . Spinal stenosis   . Spondylisthesis     History reviewed. No pertinent family history.  Social History   Socioeconomic History  . Marital status: Married    Spouse name: Not on file  . Number of children: Not on file  . Years of education: Not on file  . Highest education level: Not on file  Occupational History  . Not on file  Tobacco Use  . Smoking status: Never Smoker  . Smokeless tobacco: Never Used  Substance and Sexual Activity  . Alcohol use: No  . Drug use: No  . Sexual activity: Yes  Other Topics Concern  . Not on file  Social History Narrative  . Not on file   Social Determinants of Health   Financial Resource Strain: Not on file  Food Insecurity: Not on file  Transportation Needs: Not on file  Physical Activity: Not on file  Stress: Not on file  Social Connections: Not on file  Intimate Partner Violence: Not on file    Past Medical History, Surgical history, Social history, and  Family history were reviewed and updated as appropriate.   Please see review of systems for further details on the patient's review from today.   Objective:   Physical Exam:  There were no vitals taken for this visit.  Physical Exam Constitutional:      General: He is not in acute distress.    Appearance: He is well-developed. He is obese.  Musculoskeletal:        General: No deformity.  Neurological:     Mental Status: He is alert and oriented to person, place, and time.     Coordination: Coordination normal.  Psychiatric:        Attention and Perception: Attention and perception normal. He does not perceive auditory or visual hallucinations.        Mood and Affect: Mood is not anxious or depressed. Affect is not labile, blunt, angry or inappropriate.  Speech: Speech normal.        Behavior: Behavior normal.        Thought Content: Thought content normal. Thought content does not include homicidal or suicidal ideation. Thought content does not include homicidal or suicidal plan.        Cognition and Memory: Cognition and memory normal.        Judgment: Judgment normal.     Comments: Insight intact. No delusions.  Anxiety much better with Rexulti and so far still stable off Rexulti for 3 weeks     Lab Review:  No results found for: NA, K, CL, CO2, GLUCOSE, BUN, CREATININE, CALCIUM, PROT, ALBUMIN, AST, ALT, ALKPHOS, BILITOT, GFRNONAA, GFRAA  No results found for: WBC, RBC, HGB, HCT, PLT, MCV, MCH, MCHC, RDW, LYMPHSABS, MONOABS, EOSABS, BASOSABS  No results found for: POCLITH, LITHIUM   No results found for: PHENYTOIN, PHENOBARB, VALPROATE, CBMZ   .res Assessment: Plan:    Dakota Davis was seen today for follow-up, depression and anxiety.  Diagnoses and all orders for this visit:  Generalized anxiety disorder -     sertraline (ZOLOFT) 100 MG tablet; Take 2 tablets (200 mg total) by mouth daily.  PTSD (post-traumatic stress disorder) -     sertraline (ZOLOFT) 100 MG  tablet; Take 2 tablets (200 mg total) by mouth daily.  Depression, major, recurrent, in partial remission (HCC) -     pramipexole (MIRAPEX) 0.25 MG tablet; 1/2 tablet twice daily for 1 week then 1 tablet twice daily -     sertraline (ZOLOFT) 100 MG tablet; Take 2 tablets (200 mg total) by mouth daily.  Insomnia due to mental condition -     traZODone (DESYREL) 50 MG tablet; Take 1-2 tablets (50-100 mg total) by mouth at bedtime.   Discussed potential metabolic side effects associated with atypical antipsychotics, as well as potential risk for movement side effects. Advised pt to contact office if movement side effects occur.  For whatever reason anxiety was worse than ever.  Maybe from stopping Abilify then Rexulti. Then responded to Rexulti 2 mg with resolution of sx.  Clearly better with Rexulti 2 mg in addition to sertraline 200 mg daily. So far has been OK with 3 week change to pramipexole but he could still relaps bc of long half life of pramipexole.  Option trazodone or Sonata for insomnia 2-3 times week.  Disc both in detail and SE.  Disc pros and cons of sleep meds and risks including risks without meds from sleep deprivation.  DC Sonata. Trial trazodone 50-100 mg HS instead of Sonata has worked pretty well..  Discussed the risk of priapism and other side effects.   Disc SE in detail and SSRI withdrawal sx.  FU 3 mos.  Meredith Staggers, MD, DFAPA    Please see After Visit Summary for patient specific instructions.  No future appointments.  No orders of the defined types were placed in this encounter.   -------------------------------

## 2020-10-26 ENCOUNTER — Other Ambulatory Visit: Payer: Self-pay | Admitting: Psychiatry

## 2020-10-26 DIAGNOSIS — F5105 Insomnia due to other mental disorder: Secondary | ICD-10-CM

## 2020-11-26 ENCOUNTER — Other Ambulatory Visit: Payer: Self-pay | Admitting: Psychiatry

## 2020-11-26 DIAGNOSIS — F3341 Major depressive disorder, recurrent, in partial remission: Secondary | ICD-10-CM

## 2020-11-26 NOTE — Telephone Encounter (Signed)
Check on refill °

## 2021-01-02 ENCOUNTER — Other Ambulatory Visit: Payer: Self-pay | Admitting: Psychiatry

## 2021-01-02 DIAGNOSIS — F5105 Insomnia due to other mental disorder: Secondary | ICD-10-CM

## 2021-01-07 ENCOUNTER — Encounter: Payer: Self-pay | Admitting: Psychiatry

## 2021-01-07 ENCOUNTER — Ambulatory Visit (INDEPENDENT_AMBULATORY_CARE_PROVIDER_SITE_OTHER): Payer: BC Managed Care – PPO | Admitting: Psychiatry

## 2021-01-07 ENCOUNTER — Other Ambulatory Visit: Payer: Self-pay

## 2021-01-07 DIAGNOSIS — F411 Generalized anxiety disorder: Secondary | ICD-10-CM | POA: Diagnosis not present

## 2021-01-07 DIAGNOSIS — F5105 Insomnia due to other mental disorder: Secondary | ICD-10-CM

## 2021-01-07 DIAGNOSIS — F3341 Major depressive disorder, recurrent, in partial remission: Secondary | ICD-10-CM | POA: Diagnosis not present

## 2021-01-07 DIAGNOSIS — F4310 Post-traumatic stress disorder, unspecified: Secondary | ICD-10-CM

## 2021-01-07 DIAGNOSIS — R251 Tremor, unspecified: Secondary | ICD-10-CM

## 2021-01-07 MED ORDER — PRAMIPEXOLE DIHYDROCHLORIDE 0.25 MG PO TABS: ORAL_TABLET | ORAL | 0 refills | Status: DC

## 2021-01-07 MED ORDER — TRAZODONE HCL 100 MG PO TABS: 150.0000 mg | ORAL_TABLET | Freq: Every day | ORAL | 0 refills | Status: DC

## 2021-01-07 MED ORDER — SERTRALINE HCL 100 MG PO TABS: 200.0000 mg | ORAL_TABLET | Freq: Every day | ORAL | 1 refills | Status: DC

## 2021-01-07 NOTE — Progress Notes (Signed)
Dakota Davis [id number] [birth year] 55 y.o.  Subjective:   Patient ID:  Dakota Davis is a 55 y.o. (DOB 20-Oct-1965) male.  Chief Complaint:  Chief Complaint  Patient presents with  . Follow-up  . Generalized anxiety disorder  . Depression  . Anxiety  . Other    tremor    Depression        Past medical history includes anxiety.   Anxiety Patient reports no dizziness or palpitations.     Dakota Davis presents to the office today for follow-up of generalized anxiety disorder with elements of PTSD and major depression.  seen October 2020.  No meds were changed.  He remained on sertraline 200, aripiprazole 10 mg and Sonata or trazodone as needed insomnia.  January 11, 2020 appointment the following is noted: Had Covid Feb with flu-like sx and fatigue lasted several days. At times feels he's slipping into a bit more depression with less motivation and isolating.  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  Patient reports stable mood and denies depressed or irritable moods.  Patient with residual anxiety which sometimes interferes with sleep.  Sonata helps prn.  2-3 days a week can be hard to shut off brain.  Occ EFA.Marland Kitchen Usually 7-8 hour of sleep.  OCc daytime tiredness. Denies appetite disturbance.  Patient reports that energy and motivation have been good.  Patient denies any difficulty with concentration.  Patient denies any suicidal ideation. Plan:  He wanted to try Rexulti 2 mg augmentation  05/14/20 appt with the following noted: Rexulti for 2 mos helped but cost $250/mo copay and couldn't afford.  Felt worse off of it. Anxiety out the roof with uncertain cause. Seems worse than ever.  Varying subjects for worry.  Can't seem to get a hold of it.  Wife notices he's distant for weeks. Rexulti helped significantly.   Plan: sampled Rexulti  07/11/2020 appointment with the following noted: Been able to stay on Rexulti and is better on  it.  Clear difference.  Anxiety is better and manageable.  Mild depression comes and goes and manageable. No SE. Sleep not great with trouble staying asleep.  EFA with variable times.   Plan: Clearly better with Rexulti 2 mg in addition to sertraline 200 mg daily. Recommend a trial of trazodone at 100 250 mg nightly for insomnia  09/21/2020 phone call from patient stating he could no longer afford Rexulti.  He had already failed Abilify.  He is likely to be unable to afford the other partial dopamine agonist Vraylar.  Therefore initiated off label trial of pramipexole to take the place of the Rexulti.  Patient has a pending appointment in 3 weeks.  10/11/2020 appointment with the following noted: Ran out of Rexulti bc cost with new year was going to be $1000.   Overall still doing fine and has been since last seen.  Not noticed any changes so far with switch from Rexulti to pramipexole. Sleep is better.  Awakens but back to sleep.  Depression and anxiety are manageable. Patient reports stable mood and denies depressed or irritable moods.  Patient denies any recent difficulty with anxiety.  Patient denies difficulty with sleep initiation or maintenance. Denies appetite disturbance.  Patient reports that energy and motivation have been good.  Patient denies any difficulty with concentration.  Patient denies any suicidal ideation. No SE Plan: Clearly better with Rexulti 2 mg in addition to sertraline 200 mg daily. So far has been OK with  3 week change to pramipexole but he could still relaps bc of long half life of Rexulti. Switch for sleep to trazodone.  01/07/21 appt noted: Ok overall.  Noticed some things he just wrote off like finger twitching and now resting tremor on R hand and when does something it goes away.  Went to PCP and awaiting referral to neurologist.  Also had some balance issues for some months also and wonders about PD.   Depression is OK.  Caffeine as late as bedtime.  Coke Zero and  diet Mtn Dew.   Past Psychiatric Medication Trials: Sertraline 200, venlafaxine 225, paroxetine Abilify 10, Rexulti 2,  buspirone 30 twice daily, doxazosin 8,  Sonata  Review of Systems:  Review of Systems  Cardiovascular: Negative for palpitations.  Genitourinary: Positive for frequency.  Musculoskeletal: Negative for back pain.  Neurological: Positive for tremors. Negative for dizziness and weakness.       Balance  Psychiatric/Behavioral: Positive for depression.    Medications: I have reviewed the patient's current medications.  Current Outpatient Medications  Medication Sig Dispense Refill  . ibuprofen (ADVIL,MOTRIN) 200 MG tablet Take 400 mg every 8 (eight) hours as needed by mouth for mild pain or moderate pain.    . rosuvastatin (CRESTOR) 20 MG tablet Take 20 mg by mouth daily.    . pramipexole (MIRAPEX) 0.25 MG tablet TAKE 1 TABLET BY MOUTH TWICE DAILY 180 tablet 0  . sertraline (ZOLOFT) 100 MG tablet Take 2 tablets (200 mg total) by mouth daily. 180 tablet 1  . traZODone (DESYREL) 100 MG tablet Take 1.5-2 tablets (150-200 mg total) by mouth at bedtime. 180 tablet 0   No current facility-administered medications for this visit.    Medication Side Effects: None  Allergies:  Allergies  Allergen Reactions  . Adhesive [Tape] Other (See Comments)    Blisters   . Codeine Nausea And Vomiting  . Latex Other (See Comments)    blisters    Past Medical History:  Diagnosis Date  . Anxiety   . Anxiety   . Arthritis   . Depression   . Hyperlipidemia   . Insomnia   . Lymphadenopathy, retroperitoneal   . OSA (obstructive sleep apnea)   . Sciatica   . Seizures (HCC)   . Spinal stenosis   . Spinal stenosis   . Spondylisthesis     History reviewed. No pertinent family history.  Social History   Socioeconomic History  . Marital status: Married    Spouse name: Not on file  . Number of children: Not on file  . Years of education: Not on file  . Highest  education level: Not on file  Occupational History  . Not on file  Tobacco Use  . Smoking status: Never Smoker  . Smokeless tobacco: Never Used  Substance and Sexual Activity  . Alcohol use: No  . Drug use: No  . Sexual activity: Yes  Other Topics Concern  . Not on file  Social History Narrative  . Not on file   Social Determinants of Health   Financial Resource Strain: Not on file  Food Insecurity: Not on file  Transportation Needs: Not on file  Physical Activity: Not on file  Stress: Not on file  Social Connections: Not on file  Intimate Partner Violence: Not on file    Past Medical History, Surgical history, Social history, and Family history were reviewed and updated as appropriate.   Please see review of systems for further details on the patient's review from  today.   Objective:   Physical Exam:  There were no vitals taken for this visit.  Physical Exam Constitutional:      General: He is not in acute distress.    Appearance: He is well-developed. He is obese.  Musculoskeletal:        General: No deformity.  Neurological:     Mental Status: He is alert and oriented to person, place, and time.     Coordination: Coordination normal.  Psychiatric:        Attention and Perception: Attention and perception normal. He does not perceive auditory or visual hallucinations.        Mood and Affect: Mood is not anxious or depressed. Affect is not labile, blunt, angry or inappropriate.        Speech: Speech normal.        Behavior: Behavior normal.        Thought Content: Thought content normal. Thought content does not include homicidal or suicidal ideation. Thought content does not include homicidal or suicidal plan.        Cognition and Memory: Cognition and memory normal.        Judgment: Judgment normal.     Comments: Insight intact. No delusions.       Lab Review:  No results found for: NA, K, CL, CO2, GLUCOSE, BUN, CREATININE, CALCIUM, PROT, ALBUMIN, AST,  ALT, ALKPHOS, BILITOT, GFRNONAA, GFRAA  No results found for: WBC, RBC, HGB, HCT, PLT, MCV, MCH, MCHC, RDW, LYMPHSABS, MONOABS, EOSABS, BASOSABS  No results found for: POCLITH, LITHIUM   No results found for: PHENYTOIN, PHENOBARB, VALPROATE, CBMZ   .res Assessment: Plan:    Jaecob was seen today for follow-up, generalized anxiety disorder, depression, anxiety and other.  Diagnoses and all orders for this visit:  Generalized anxiety disorder -     sertraline (ZOLOFT) 100 MG tablet; Take 2 tablets (200 mg total) by mouth daily.  PTSD (post-traumatic stress disorder) -     sertraline (ZOLOFT) 100 MG tablet; Take 2 tablets (200 mg total) by mouth daily.  Depression, major, recurrent, in partial remission (HCC) -     pramipexole (MIRAPEX) 0.25 MG tablet; TAKE 1 TABLET BY MOUTH TWICE DAILY -     sertraline (ZOLOFT) 100 MG tablet; Take 2 tablets (200 mg total) by mouth daily.  Insomnia due to mental condition -     traZODone (DESYREL) 100 MG tablet; Take 1.5-2 tablets (150-200 mg total) by mouth at bedtime.  Tremor of unknown origin  Greater than 50% of 30 min face to face time with patient was spent on counseling and coordination of care. We discussed  Discussed potential metabolic side effects associated with atypical antipsychotics, as well as potential risk for movement side effects. Advised pt to contact office if movement side effects occur.  Prveviously, For whatever reason anxiety was worse than ever,   Maybe from stopping Abilify then Rexulti. Then responded to Rexulti 2 mg with resolution of sx. Had to switch to pramipexole for cost reasons.  So far doing oK. Continue pramipexole 0.25 mg BID Sertraline 200 daily  Extensive discussion of types of tremor including sertraline and risk Parkinsonism from above meds and again reviewed off label [place] of pramipexole in low dose for depression but also it's used in higher doses for PD.  He's awaiting referal to neuro.  He notices gait  disturbance and balance problems too suggestive of PD.  trazodone 100 -200 mg HS helps go asleep but doesn't stay asleep..  Discussed the  risk of priapism and other side effects. Switch to decaf by 3 PM.  Emphasized this   Disc SE in detail and SSRI withdrawal sx.  FU 3 mos.  Meredith Staggers, MD, DFAPA    Please see After Visit Summary for patient specific instructions.  No future appointments.  No orders of the defined types were placed in this encounter.   -------------------------------

## 2021-02-14 ENCOUNTER — Emergency Department: Payer: BC Managed Care – PPO

## 2021-02-14 ENCOUNTER — Emergency Department
Admission: EM | Admit: 2021-02-14 | Discharge: 2021-02-14 | Disposition: A | Discharge status: Home / Self Care | Payer: BC Managed Care – PPO | Attending: Emergency Medicine | Admitting: Emergency Medicine

## 2021-02-14 DIAGNOSIS — R2990 Unspecified symptoms and signs involving the nervous system: Secondary | ICD-10-CM | POA: Diagnosis not present

## 2021-02-14 DIAGNOSIS — Z91040 Latex allergy status: Secondary | ICD-10-CM | POA: Diagnosis not present

## 2021-02-14 DIAGNOSIS — R42 Dizziness and giddiness: Secondary | ICD-10-CM | POA: Diagnosis present

## 2021-02-14 LAB — COMPREHENSIVE METABOLIC PANEL
ALT: 29 U/L (ref 0–44)
AST: 27 U/L (ref 15–41)
Albumin: 4.4 g/dL (ref 3.5–5.0)
Alkaline Phosphatase: 57 U/L (ref 38–126)
Anion gap: 9 (ref 5–15)
BUN: 13 mg/dL (ref 6–20)
CO2: 28 mmol/L (ref 22–32)
Calcium: 9.5 mg/dL (ref 8.9–10.3)
Chloride: 102 mmol/L (ref 98–111)
Creatinine, Ser: 1.25 mg/dL — ABNORMAL HIGH (ref 0.61–1.24)
GFR, Estimated: >60 mL/min (ref >60)
Glucose, Bld: 87 mg/dL (ref 70–99)
Potassium: 3.7 mmol/L (ref 3.5–5.1)
Sodium: 139 mmol/L (ref 135–145)
Total Bilirubin: 0.6 mg/dL (ref 0.3–1.2)
Total Protein: 7.8 g/dL (ref 6.5–8.1)

## 2021-02-14 LAB — CBC
HCT: 42.4 % (ref 39.0–52.0)
Hemoglobin: 14.5 g/dL (ref 13.0–17.0)
MCH: 30.1 pg (ref 26.0–34.0)
MCHC: 34.2 g/dL (ref 30.0–36.0)
MCV: 88.1 fL (ref 80.0–100.0)
Platelets: 187 10*3/uL (ref 150–400)
RBC: 4.81 MIL/uL (ref 4.22–5.81)
RDW: 12.8 % (ref 11.5–15.5)
WBC: 9 10*3/uL (ref 4.0–10.5)
nRBC: 0 % (ref 0.0–0.2)

## 2021-02-14 LAB — PROTIME-INR
INR: 0.9 (ref 0.8–1.2)
Prothrombin Time: 12.6 seconds (ref 11.4–15.2)

## 2021-02-14 LAB — APTT: aPTT: 30 seconds (ref 24–36)

## 2021-02-14 LAB — DIFFERENTIAL
Abs Immature Granulocytes: 0.03 10*3/uL (ref 0.00–0.07)
Basophils Absolute: 0.1 10*3/uL (ref 0.0–0.1)
Basophils Relative: 1 %
Eosinophils Absolute: 0.4 10*3/uL (ref 0.0–0.5)
Eosinophils Relative: 5 %
Immature Granulocytes: 0 %
Lymphocytes Relative: 30 %
Lymphs Abs: 2.7 10*3/uL (ref 0.7–4.0)
Monocytes Absolute: 0.8 10*3/uL (ref 0.1–1.0)
Monocytes Relative: 9 %
Neutro Abs: 5 10*3/uL (ref 1.7–7.7)
Neutrophils Relative %: 55 %

## 2021-02-14 MED ORDER — GADOBUTROL 1 MMOL/ML IV SOLN
10.0000 mL | Freq: Once | INTRAVENOUS | Status: AC | PRN
  Administered 2021-02-14: 10 mL via INTRAVENOUS

## 2021-02-14 MED ORDER — SODIUM CHLORIDE 0.9% FLUSH
3.0000 mL | Freq: Once | INTRAVENOUS | Status: AC
  Administered 2021-02-14: 3 mL via INTRAVENOUS

## 2021-02-14 MED ORDER — LORAZEPAM 2 MG/ML IJ SOLN
1.0000 mg | Freq: Once | INTRAMUSCULAR | Status: AC
  Administered 2021-02-14: 1 mg via INTRAVENOUS
  Filled 2021-02-14: qty 1

## 2021-02-14 MED ORDER — SODIUM CHLORIDE 0.9% FLUSH
3.0000 mL | Freq: Once | INTRAVENOUS | Status: DC
Start: 2021-02-14 — End: 2021-02-14

## 2021-02-14 NOTE — Progress Notes (Signed)
CODE STROKE- PHARMACY COMMUNICATION   Time CODE STROKE called/page received:1502  Time response to CODE STROKE was made (in person or via phone): 1510  Time Stroke Kit retrieved from [employer] (only if needed):1510  Name of Provider/Nurse contacted: no tPA, Dr. Clover Mealy ,PharmD Clinical Pharmacist  02/14/2021  4:17 PM

## 2021-02-14 NOTE — Discharge Instructions (Signed)
Please seek medical attention for any high fevers, chest pain, shortness of breath, change in behavior, persistent vomiting, bloody stool or any other new or concerning symptoms.  

## 2021-02-14 NOTE — ED Provider Notes (Signed)
[facility] Emergency Department Provider Note   ____________________________________________   I have reviewed the triage vital signs and the nursing notes.   HISTORY  Chief Complaint Dizziness  History limited by: Not Limited   HPI Dakota Davis is a 55 y.o. male who presents to the emergency department today because of concerns for episode of dizziness lightheadedness and potential strokelike symptoms.  Patient states he was at work when this happened.  He was sitting down when all of a sudden he started feeling the dizziness.  This is happened to him in the past.  He states that he was worked up for about 5 years ago and was told he had vasovagal syncope.  However today's episode seem to be the worst.  Patient denies any recent illness.  He denies any recent change in medication.   Records reviewed. Per medical record review patient has a history of anxiety  Past Medical History:  Diagnosis Date  . Anxiety   . Anxiety   . Arthritis   . Depression   . Hyperlipidemia   . Insomnia   . Lymphadenopathy, retroperitoneal   . OSA (obstructive sleep apnea)   . Sciatica   . Seizures (HCC)   . Spinal stenosis   . Spinal stenosis   . Spondylisthesis     Patient Active Problem List   Diagnosis Date Noted  . GAD (generalized anxiety disorder) 07/04/2018  . PTSD (post-traumatic stress disorder) 07/04/2018  . Stable angina (HCC) 08/03/2017    Past Surgical History:  Procedure Laterality Date  . BACK SURGERY    . BACK SURGERY     10/31/2016,12/21/2015  . KNEE ARTHROSCOPY     2018  . LEFT HEART CATH AND CORONARY ANGIOGRAPHY N/A 08/05/2017   Procedure: LEFT HEART CATH AND CORONARY ANGIOGRAPHY;  Surgeon: Lamar Blinks, MD;  Location: [facility] INVASIVE CV LAB;  Service: Cardiovascular;  Laterality: N/A;  . TONSILECTOMY/ADENOIDECTOMY WITH MYRINGOTOMY      Prior to Admission medications   Medication Sig Start Date End Date Taking? Authorizing Provider   ibuprofen (ADVIL,MOTRIN) 200 MG tablet Take 400 mg every 8 (eight) hours as needed by mouth for mild pain or moderate pain.    [provider]  pramipexole (MIRAPEX) 0.25 MG tablet TAKE 1 TABLET BY MOUTH TWICE DAILY 01/07/21   Cottle, Steva Ready., MD  rosuvastatin (CRESTOR) 20 MG tablet Take 20 mg by mouth daily.    [provider]  sertraline (ZOLOFT) 100 MG tablet Take 2 tablets (200 mg total) by mouth daily. 01/07/21   Cottle, Steva Ready., MD  traZODone (DESYREL) 100 MG tablet Take 1.5-2 tablets (150-200 mg total) by mouth at bedtime. 01/07/21   Cottle, Steva Ready., MD    Allergies Adhesive [tape], Codeine, and Latex  No family history on file.  Social History Social History   Tobacco Use  . Smoking status: Never Smoker  . Smokeless tobacco: Never Used  Substance Use Topics  . Alcohol use: No  . Drug use: No    Review of Systems Constitutional: No fever/chills Eyes: No visual changes. ENT: No sore throat. Cardiovascular: Denies chest pain. Respiratory: Denies shortness of breath. Gastrointestinal: No abdominal pain.  No nausea, no vomiting.  No diarrhea.   Genitourinary: Negative for dysuria. Musculoskeletal: Positive for right mid back pain. Skin: Negative for rash. Neurological: Positive for dizziness. ____________________________________________   PHYSICAL EXAM:  VITAL SIGNS: ED Triage Vitals  Enc Vitals Group     BP 02/14/21 1534 Marland Kitchen)  160/100     Pulse Rate 02/14/21 1534 95     Resp 02/14/21 1534 16     Temp 02/14/21 1539 98.2 F (36.8 C)     Temp Source 02/14/21 1539 Oral     SpO2 02/14/21 1534 95 %   Constitutional: Alert and oriented.  Eyes: Conjunctivae are normal.  ENT      Head: Normocephalic and atraumatic.      Nose: No congestion/rhinnorhea.      Mouth/Throat: Mucous membranes are moist.      Neck: No stridor. Hematological/Lymphatic/Immunilogical: No cervical lymphadenopathy. Cardiovascular: Normal rate, regular rhythm.  No  murmurs, rubs, or gallops.  Respiratory: Normal respiratory effort without tachypnea nor retractions. Breath sounds are clear and equal bilaterally. No wheezes/rales/rhonchi. Gastrointestinal: Soft and non tender. No rebound. No guarding.  Genitourinary: Deferred Musculoskeletal: Normal range of motion in all extremities. No lower extremity edema. Neurologic:  Normal speech and language. No gross focal neurologic deficits are appreciated.  Skin:  Skin is warm, dry and intact. No rash noted. Psychiatric: Mood and affect are normal. Speech and behavior are normal. Patient exhibits appropriate insight and judgment.  ____________________________________________    LABS (pertinent positives/negatives)  CMP wnl except cr 1.25 CBC wbc 9.0, hgb 14.5, plt 187  ____________________________________________   EKG  I, Phineas Semen, attending physician, personally viewed and interpreted this EKG  EKG Time: 1545 Rate: 88 Rhythm: normal sinus rhythm Axis: normal Intervals: qtc 452 QRS: narrow ST changes: no st elevation Impression: normal ekg   ____________________________________________    RADIOLOGY  CT head No acute abnormality  MR brain No acute abnormality ____________________________________________   PROCEDURES  Procedures  ____________________________________________   INITIAL IMPRESSION / ASSESSMENT AND PLAN / ED COURSE  Pertinent labs & imaging results that were available during my care of the patient were reviewed by me and considered in my medical decision making (see chart for details).   Patient presented to the emergency department today because of concerns for lightheadedness and dizziness and potential strokelike symptoms.  Patient was evaluated by neurology upon arrival.  At this time I have low suspicion for acute CVA.  CT scan and MRI were both performed without any acute abnormalities. Blood work without concerning anemia or electrolyte abnormalities.   Will plan on discharging to follow-up with outpatient physicians.  ____________________________________________   FINAL CLINICAL IMPRESSION(S) / ED DIAGNOSES  Final diagnoses:  Lightheadedness     Note: This dictation was prepared with Dragon dictation. Any transcriptional errors that result from this process are unintentional     Phineas Semen, MD 02/14/21 2104

## 2021-02-14 NOTE — ED Notes (Addendum)
Report received from [place], [place]. Patient to be going to MRI within 15 mins per [facility]. Dr. Derrill Kay contacted for pre-medication for MRI.

## 2021-02-14 NOTE — ED Triage Notes (Signed)
Pt back in ED room after CT. Neurologist and stroke coordinator at bedside. Plan of care being explained to pt and wife.  CBG upon arrival was 91.   According to EMS, they were called to pt home for stroke symptoms of L facial droop and pt feeling weak and dizzy. EMS arrived at 1450 and 1450 was also noted to be LKW. Pt was seen to have 3 episodes in EMS truck of transient L sided facial droop and slurred speech, dizziness which resolved after less than 1 minute.  Pt arrived to ED at 1311 and neurologist was already waiting for pt.  NIH performed by neurologist upon pt arrival was 0.   EMS vitals were normal. 12 lead was unremarkable.

## 2021-02-14 NOTE — ED Notes (Signed)
Patient transported to MRI 

## 2021-02-14 NOTE — ED Notes (Signed)
Informed pt and wife that MRI was contacted again and they said MRI will be within 1 hour.

## 2021-02-14 NOTE — ED Notes (Signed)
Neurologist gave verbal order for mNIHss q2hr, not q30 min.

## 2021-02-14 NOTE — Consult Note (Signed)
Neurology Consultation  Reason for Consult: Code stroke Referring Physician: Dr. Derrill Kay  CC: Left facial weakness, speech problems  History is obtained from: Chart review, patient  HPI: Dakota Davis is a 55 y.o. male past history of generalized anxiety, sleep apnea, factor V Leiden, concern for seizures due to some spells over the last 4 to 5 years and left arm tremors not on any antiepileptics brought in for concern for left-sided facial weakness and speech disturbance with last known well at 2:50 PM today. 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  He had sudden onset of slurred speech and on EMS arrival had some left-sided weakness.  The symptoms happened 3 times in total and lasted for about 10 to 20 seconds.  He was brought in for concern for multiple "TIAs". Reports no worsening anxiety or depression-reports compliance to medications that his psychiatrist has prescribed. No chest pain or shortness of breath. Reports a mild headache.  Is not a migraine or and does not get regular headaches.  Also reports of a back pain exacerbation has been going on since Saturday and has been bothering him to the point where it is waking him up from sleep.    LKW: 2:50 PM on 02/14/2021 tpa given?: no, symptoms resolved-NIH 0 Premorbid modified Rankin scale (mRS): 0  ROS: Full ROS was performed and is negative except as noted in the HPI.   Past Medical History:  Diagnosis Date  . Anxiety   . Anxiety   . Arthritis   . Depression   . Hyperlipidemia   . Insomnia   . Lymphadenopathy, retroperitoneal   . OSA (obstructive sleep apnea)   . Sciatica   . Seizures (HCC)   . Spinal stenosis   . Spinal stenosis   . Spondylisthesis     No family history on file.   Social History:   reports that he has never smoked. He has never used smokeless tobacco. He reports  that he does not drink alcohol and does not use drugs.  Medications  Current Facility-Administered Medications:  .  sodium chloride flush (NS) 0.9 % injection 3 mL, 3 mL, Intravenous, Once, Phineas Semen, MD  Current Outpatient Medications:  .  ibuprofen (ADVIL,MOTRIN) 200 MG tablet, Take 400 mg every 8 (eight) hours as needed by mouth for mild pain or moderate pain., Disp: , Rfl:  .  pramipexole (MIRAPEX) 0.25 MG tablet, TAKE 1 TABLET BY MOUTH TWICE DAILY, Disp: 180 tablet, Rfl: 0 .  rosuvastatin (CRESTOR) 20 MG tablet, Take 20 mg by mouth daily., Disp: , Rfl:  .  sertraline (ZOLOFT) 100 MG tablet, Take 2 tablets (200 mg total) by mouth daily., Disp: 180 tablet, Rfl: 1 .  traZODone (DESYREL) 100 MG tablet, Take 1.5-2 tablets (150-200 mg total) by mouth at bedtime., Disp: 180 tablet, Rfl: 0   Exam: Current vital signs: BP (!) 160/100   Pulse 95   Temp 98.2 F (36.8 C) (Oral)   Resp 16   SpO2 95%  Vital signs in last 24 hours: Temp:  [98.2 F (36.8 C)] 98.2 F (36.8 C) (06/02 1539) Pulse Rate:  [95] 95 (06/02 1534) Resp:  [16] 16 (06/02 1534) BP: (160)/(100) 160/100 (06/02 1534) SpO2:  [95 %] 95 % (06/02 1534) General: Awake alert in no distress HEENT: [place] atraumatic Lungs: Clear Cardiovascular: Regular rhythm Abdomen nondistended nontender Extremities warm perfused  Neurological exam Awake alert oriented x3 No dysarthria but had an episode for 10 seconds where he had stuttering speech and fluttering of his eyelids that resolved without any ensuing postictal appearing weakness. Cranial 2-12 intact Motor exam with symmetric 5/5 strength all over. Sensation intact to touch all over without extinction Coordination with no dysmetria, left arm had a distractible tremor on outstretched arm. Gait testing deferred NIH stroke scale 0  Labs I have reviewed labs in epic and the results pertinent to this consultation are:   CBC    Component Value Date/Time   WBC 9.0  02/14/2021 1510   RBC 4.81 02/14/2021 1510   HGB 14.5 02/14/2021 1510   HCT 42.4 02/14/2021 1510   PLT 187 02/14/2021 1510   MCV 88.1 02/14/2021 1510   MCH 30.1 02/14/2021 1510   MCHC 34.2 02/14/2021 1510   RDW 12.8 02/14/2021 1510   LYMPHSABS 2.7 02/14/2021 1510   MONOABS 0.8 02/14/2021 1510   EOSABS 0.4 02/14/2021 1510   BASOSABS 0.1 02/14/2021 1510    CMP     Component Value Date/Time   NA 139 02/14/2021 1510   K 3.7 02/14/2021 1510   CL 102 02/14/2021 1510   CO2 28 02/14/2021 1510   GLUCOSE 87 02/14/2021 1510   BUN 13 02/14/2021 1510   CREATININE 1.25 (H) 02/14/2021 1510   CALCIUM 9.5 02/14/2021 1510   PROT 7.8 02/14/2021 1510   ALBUMIN 4.4 02/14/2021 1510   AST 27 02/14/2021 1510   ALT 29 02/14/2021 1510   ALKPHOS 57 02/14/2021 1510   BILITOT 0.6 02/14/2021 1510   GFRNONAA >60 02/14/2021 1510   Imaging I have reviewed the images obtained: CT head aspects 10.  No bleed  Assessment: 55 year old with a significant history of anxiety and depression on antidepressants and treatment with a psychiatrist, hypertension, hyperlipidemia, factor V Leiden, presenting to the emergency room for episodic eye fluttering and speech difficulty. He had 1 episode in the emergency room-eyelid fluttering, stuttering speech lasted about 10 seconds with immediate spontaneous resolution. Very suggestive of stress/psychogenic etiology. Given risk factors though I would recommend MRI of the brain.  Impression:  Code stroke for strokelike symptoms of left facial weakness- likely related to below  Stereotypic episode of eyelid fluttering and stuttering speech with immediate resolution without any postictal period.  Suggestive of a functional etiology.  Left arm tremor-likely functional tremor-easily distractible.  Anxiety, depression  Recommendations:  Stat MRI of the brain without contrast  If negative for stroke, can be discharged with outpatient follow-up with a psychiatrist and has  a follow-up neurology appointment later this month.  Discussed with Dr. Derrill Kay in the ER.   I will follow up MRI results with you.  -- Milon Dikes, MD Neurologist [facility] Pager: [phone]

## 2021-02-15 LAB — CBG MONITORING, ED: Glucose-Capillary: 91 mg/dL (ref 70–99)

## 2021-03-19 ENCOUNTER — Ambulatory Visit: Payer: BC Managed Care – PPO | Admitting: Psychiatry

## 2021-03-19 ENCOUNTER — Other Ambulatory Visit: Payer: Self-pay

## 2021-03-19 ENCOUNTER — Encounter: Payer: Self-pay | Admitting: Psychiatry

## 2021-03-19 DIAGNOSIS — F411 Generalized anxiety disorder: Secondary | ICD-10-CM | POA: Diagnosis not present

## 2021-03-19 DIAGNOSIS — G20 Parkinson's disease: Secondary | ICD-10-CM

## 2021-03-19 DIAGNOSIS — F3341 Major depressive disorder, recurrent, in partial remission: Secondary | ICD-10-CM

## 2021-03-19 DIAGNOSIS — F5105 Insomnia due to other mental disorder: Secondary | ICD-10-CM | POA: Diagnosis not present

## 2021-03-19 DIAGNOSIS — F4310 Post-traumatic stress disorder, unspecified: Secondary | ICD-10-CM

## 2021-03-19 MED ORDER — PRAMIPEXOLE DIHYDROCHLORIDE 0.25 MG PO TABS: ORAL_TABLET | ORAL | 1 refills | Status: DC

## 2021-03-19 MED ORDER — SERTRALINE HCL 100 MG PO TABS: 200.0000 mg | ORAL_TABLET | Freq: Every day | ORAL | 1 refills | Status: DC

## 2021-03-19 MED ORDER — TRAZODONE HCL 100 MG PO TABS: 150.0000 mg | ORAL_TABLET | Freq: Every day | ORAL | 1 refills | Status: DC

## 2021-03-19 NOTE — Progress Notes (Signed)
Dakota Davis [id number] [birth year] 55 y.o.  Subjective:   Patient ID:  Dakota Davis is a 55 y.o. (DOB [birth year]) male.  Chief Complaint:  Chief Complaint  Patient presents with   Follow-up   Depression   Anxiety   Stress    Health concerns    Depression        Past medical history includes anxiety.   Anxiety Patient reports no dizziness or palpitations.    Dakota Davis presents to the office today for follow-up of generalized anxiety disorder with elements of PTSD and major depression.  seen October 2020.  No meds were changed.  He remained on sertraline 200, aripiprazole 10 mg and Sonata or trazodone as needed insomnia.  January 11, 2020 appointment the following is noted: Had Covid Feb with flu-like sx and fatigue lasted several days. At times feels he's slipping into a bit more depression with less motivation and isolating.  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  Patient reports stable mood and denies depressed or irritable moods.  Patient with residual anxiety which sometimes interferes with sleep.  Sonata helps prn.  2-3 days a week can be hard to shut off brain.  Occ EFA.Marland Kitchen Usually 7-8 hour of sleep.  OCc daytime tiredness. Denies appetite disturbance.  Patient reports that energy and motivation have been good.  Patient denies any difficulty with concentration.  Patient denies any suicidal ideation. Plan:  He wanted to try Rexulti 2 mg augmentation  05/14/20 appt with the following noted: Rexulti for 2 mos helped but cost $250/mo copay and couldn't afford.  Felt worse off of it. Anxiety out the roof with uncertain cause. Seems worse than ever.  Varying subjects for worry.  Can't seem to get a hold of it.  Wife notices he's distant for weeks. Rexulti helped significantly.   Plan: sampled Rexulti  07/11/2020 appointment with the following noted: Been able to stay on Rexulti and is better on it.  Clear difference.   Anxiety is better and manageable.  Mild depression comes and goes and manageable. No SE. Sleep not great with trouble staying asleep.  EFA with variable times.   Plan: Clearly better with Rexulti 2 mg in addition to sertraline 200 mg daily. Recommend a trial of trazodone at 100 250 mg nightly for insomnia  09/21/2020 phone call from patient stating he could no longer afford Rexulti.  He had already failed Abilify.  He is likely to be unable to afford the other partial dopamine agonist Vraylar.  Therefore initiated off label trial of pramipexole to take the place of the Rexulti.  Patient has a pending appointment in 3 weeks.  10/11/2020 appointment with the following noted: Ran out of Rexulti bc cost with new year was going to be $1000.   Overall still doing fine and has been since last seen.  Not noticed any changes so far with switch from Rexulti to pramipexole. Sleep is better.  Awakens but back to sleep.  Depression and anxiety are manageable. Patient reports stable mood and denies depressed or irritable moods.  Patient denies any recent difficulty with anxiety.  Patient denies difficulty with sleep initiation or maintenance. Denies appetite disturbance.  Patient reports that energy and motivation have been good.  Patient denies any difficulty with concentration.  Patient denies any suicidal ideation. No SE Plan: Clearly better with Rexulti 2 mg in addition to sertraline 200 mg daily. So far has been OK with 3 week change to pramipexole  but he could still relaps bc of long half life of Rexulti. Switch for sleep to trazodone.  01/07/21 appt noted: Ok overall.  Noticed some things he just wrote off like finger twitching and now resting tremor on R hand and when does something it goes away.  Went to PCP and awaiting referral to neurologist.  Also had some balance issues for some months also and wonders about PD.   Depression is OK.  Caffeine as late as bedtime.  Coke Zero and diet Mtn Dew. Plan: No  med changes  03/19/2021 appointment with the following noted: Reviewed neuro note 02/21/2021 Dr. Sherryll Burger who suspects Parkinson's disease and started carbidopa levodopa. Maybe less stiffness with Sinemet. Pretty good re: mental health since here.  Trazodone helps some with sleep. No SE Patient reports stable mood and denies depressed or irritable moods.  Patient denies any recent difficulty with anxiety other than mild background levels.  His wife and her family are close and create stress.  Patient denies difficulty with sleep initiation or maintenance. Denies appetite disturbance.  Patient reports that energy and motivation have been good.  Patient denies any difficulty with concentration.  Patient denies any suicidal ideation.  Past Psychiatric Medication Trials: Sertraline 200, venlafaxine 225, paroxetine Abilify 10, Rexulti 2,  Pramipexole  buspirone 30 twice daily, doxazosin 8,  Sonata  Review of Systems:  Review of Systems  Cardiovascular:  Negative for palpitations.  Genitourinary:  Positive for frequency.  Musculoskeletal:  Negative for back pain.  Neurological:  Positive for tremors. Negative for dizziness and weakness.       Balance and some stiffness   Medications: I have reviewed the patient's current medications.  Current Outpatient Medications  Medication Sig Dispense Refill   carbidopa-levodopa (SINEMET IR) 25-100 MG tablet Take 1 tablet by mouth 3 (three) times daily.     rosuvastatin (CRESTOR) 20 MG tablet Take 20 mg by mouth daily.     pramipexole (MIRAPEX) 0.25 MG tablet TAKE 1 TABLET BY MOUTH TWICE DAILY 180 tablet 1   sertraline (ZOLOFT) 100 MG tablet Take 2 tablets (200 mg total) by mouth daily. 180 tablet 1   traZODone (DESYREL) 100 MG tablet Take 1.5-2 tablets (150-200 mg total) by mouth at bedtime. 180 tablet 1   No current facility-administered medications for this visit.    Medication Side Effects: None  Allergies:  Allergies  Allergen Reactions    Adhesive [Tape] Other (See Comments)    Blisters    Codeine Nausea And Vomiting   Latex Other (See Comments)    blisters    Past Medical History:  Diagnosis Date   Anxiety    Anxiety    Arthritis    Depression    Hyperlipidemia    Insomnia    Lymphadenopathy, retroperitoneal    OSA (obstructive sleep apnea)    Sciatica    Seizures (HCC)    Spinal stenosis    Spinal stenosis    Spondylisthesis     History reviewed. No pertinent family history.  Social History   Socioeconomic History   Marital status: Married    Spouse name: Not on file   Number of children: Not on file   Years of education: Not on file   Highest education level: Not on file  Occupational History   Not on file  Tobacco Use   Smoking status: Never   Smokeless tobacco: Never  Substance and Sexual Activity   Alcohol use: No   Drug use: No   Sexual activity: Yes  Other Topics Concern   Not on file  Social History Narrative   Not on file   Social Determinants of Health   Financial Resource Strain: Not on file  Food Insecurity: Not on file  Transportation Needs: Not on file  Physical Activity: Not on file  Stress: Not on file  Social Connections: Not on file  Intimate Partner Violence: Not on file    Past Medical History, Surgical history, Social history, and Family history were reviewed and updated as appropriate.   Please see review of systems for further details on the patient's review from today.   Objective:   Physical Exam:  There were no vitals taken for this visit.  Physical Exam Constitutional:      General: He is not in acute distress.    Appearance: He is well-developed. He is obese.  Musculoskeletal:        General: No deformity.  Neurological:     Mental Status: He is alert and oriented to person, place, and time.     Coordination: Coordination normal.  Psychiatric:        Attention and Perception: Attention and perception normal. He does not perceive auditory or  visual hallucinations.        Mood and Affect: Mood is anxious. Mood is not depressed. Affect is not labile, blunt, angry or inappropriate.        Speech: Speech normal.        Behavior: Behavior normal.        Thought Content: Thought content normal. Thought content does not include homicidal or suicidal ideation. Thought content does not include homicidal or suicidal plan.        Cognition and Memory: Cognition and memory normal.        Judgment: Judgment normal.     Comments: Insight intact. No delusions.      Lab Review:     Component Value Date/Time   NA 139 02/14/2021 1510   K 3.7 02/14/2021 1510   CL 102 02/14/2021 1510   CO2 28 02/14/2021 1510   GLUCOSE 87 02/14/2021 1510   BUN 13 02/14/2021 1510   CREATININE 1.25 (H) 02/14/2021 1510   CALCIUM 9.5 02/14/2021 1510   PROT 7.8 02/14/2021 1510   ALBUMIN 4.4 02/14/2021 1510   AST 27 02/14/2021 1510   ALT 29 02/14/2021 1510   ALKPHOS 57 02/14/2021 1510   BILITOT 0.6 02/14/2021 1510   GFRNONAA >60 02/14/2021 1510       Component Value Date/Time   WBC 9.0 02/14/2021 1510   RBC 4.81 02/14/2021 1510   HGB 14.5 02/14/2021 1510   HCT 42.4 02/14/2021 1510   PLT 187 02/14/2021 1510   MCV 88.1 02/14/2021 1510   MCH 30.1 02/14/2021 1510   MCHC 34.2 02/14/2021 1510   RDW 12.8 02/14/2021 1510   LYMPHSABS 2.7 02/14/2021 1510   MONOABS 0.8 02/14/2021 1510   EOSABS 0.4 02/14/2021 1510   BASOSABS 0.1 02/14/2021 1510    No results found for: POCLITH, LITHIUM   No results found for: PHENYTOIN, PHENOBARB, VALPROATE, CBMZ   .res Assessment: Plan:    Dakota Davis was seen today for follow-up, depression, anxiety and stress.  Diagnoses and all orders for this visit:  Generalized anxiety disorder -     sertraline (ZOLOFT) 100 MG tablet; Take 2 tablets (200 mg total) by mouth daily.  Depression, major, recurrent, in partial remission (HCC) -     pramipexole (MIRAPEX) 0.25 MG tablet; TAKE 1 TABLET BY MOUTH TWICE DAILY -  sertraline (ZOLOFT) 100 MG tablet; Take 2 tablets (200 mg total) by mouth daily.  PTSD (post-traumatic stress disorder) -     sertraline (ZOLOFT) 100 MG tablet; Take 2 tablets (200 mg total) by mouth daily.  Insomnia due to mental condition -     traZODone (DESYREL) 100 MG tablet; Take 1.5-2 tablets (150-200 mg total) by mouth at bedtime.  Primary parkinsonism (HCC)  Greater than 50% of 30 min face to face time with patient was spent on counseling and coordination of care. We discussed  Discussed potential metabolic side effects associated with atypical antipsychotics, as well as potential risk for movement side effects. Advised pt to contact office if movement side effects occur.  Prveviously, For whatever reason anxiety was worse than ever,   Maybe from stopping Abilify then Rexulti. Then responded to Rexulti 2 mg with resolution of sx. Had to switch to pramipexole for cost reasons. Still doing oK.  Rexulti worked better. Good to have switched DT recent dx PD.  Disc pramipexole in reference to dx PD.  Dr. Sherryll Burger ok with it's low dose added on for off-label treatment of depression. Continue pramipexole 0.25 mg BID Sertraline 200 daily  trazodone 100 -200 mg HS helps go asleep but doesn't stay asleep..  Discussed the risk of priapism and other side effects. Switch to decaf by 3 PM.  Emphasized this   Disc SE in detail and SSRI withdrawal sx.  Supportive therapy dealing with   FU 4-6 mos.  Meredith Staggers, MD, DFAPA    Please see After Visit Summary for patient specific instructions.  No future appointments.  No orders of the defined types were placed in this encounter.   -------------------------------

## 2021-07-22 ENCOUNTER — Other Ambulatory Visit: Payer: Self-pay

## 2021-07-22 ENCOUNTER — Ambulatory Visit: Payer: BC Managed Care – PPO | Admitting: Psychiatry

## 2021-07-22 ENCOUNTER — Encounter: Payer: Self-pay | Admitting: Psychiatry

## 2021-07-22 DIAGNOSIS — F3341 Major depressive disorder, recurrent, in partial remission: Secondary | ICD-10-CM | POA: Diagnosis not present

## 2021-07-22 DIAGNOSIS — G20 Parkinson's disease: Secondary | ICD-10-CM

## 2021-07-22 DIAGNOSIS — F411 Generalized anxiety disorder: Secondary | ICD-10-CM

## 2021-07-22 DIAGNOSIS — F4310 Post-traumatic stress disorder, unspecified: Secondary | ICD-10-CM

## 2021-07-22 DIAGNOSIS — F5105 Insomnia due to other mental disorder: Secondary | ICD-10-CM

## 2021-07-22 MED ORDER — HYDROXYZINE HCL 25 MG PO TABS
25.0000 mg | ORAL_TABLET | Freq: Every evening | ORAL | 1 refills | Status: DC | PRN
Start: 2021-07-22 — End: 2021-11-12

## 2021-07-22 NOTE — Progress Notes (Signed)
Dakota Davis [id number] 18-Dec-1965 55 y.o.  Subjective:   Patient ID:  Dakota Davis is a 55 y.o. (DOB [birth year]) male.  Chief Complaint:  Chief Complaint  Patient presents with   Follow-up   Anxiety   Depression   Sleeping Problem    Depression        Past medical history includes anxiety.   Anxiety Patient reports no dizziness or palpitations.    Dakota Davis presents to the office today for follow-up of generalized anxiety disorder with elements of PTSD and major depression.  seen October 2020.  No meds were changed.  He remained on sertraline 200, aripiprazole 10 mg and Sonata or trazodone as needed insomnia.  January 11, 2020 appointment the following is noted: Had Covid Feb with flu-like sx and fatigue lasted several days. At times feels he's slipping into a bit more depression with less motivation and isolating.  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  Patient reports stable mood and denies depressed or irritable moods.  Patient with residual anxiety which sometimes interferes with sleep.  Sonata helps prn.  2-3 days a week can be hard to shut off brain.  Occ EFA.Marland Kitchen Usually 7-8 hour of sleep.  OCc daytime tiredness. Denies appetite disturbance.  Patient reports that energy and motivation have been good.  Patient denies any difficulty with concentration.  Patient denies any suicidal ideation. Plan:  He wanted to try Rexulti 2 mg augmentation  05/14/20 appt with the following noted: Rexulti for 2 mos helped but cost $250/mo copay and couldn't afford.  Felt worse off of it. Anxiety out the roof with uncertain cause. Seems worse than ever.  Varying subjects for worry.  Can't seem to get a hold of it.  Wife notices he's distant for weeks. Rexulti helped significantly.   Plan: sampled Rexulti  07/11/2020 appointment with the following noted: Been able to stay on Rexulti and is better on it.  Clear difference.  Anxiety is  better and manageable.  Mild depression comes and goes and manageable. No SE. Sleep not great with trouble staying asleep.  EFA with variable times.   Plan: Clearly better with Rexulti 2 mg in addition to sertraline 200 mg daily. Recommend a trial of trazodone at 100 250 mg nightly for insomnia  09/21/2020 phone call from patient stating he could no longer afford Rexulti.  He had already failed Abilify.  He is likely to be unable to afford the other partial dopamine agonist Vraylar.  Therefore initiated off label trial of pramipexole to take the place of the Rexulti.  Patient has a pending appointment in 3 weeks.  10/11/2020 appointment with the following noted: Ran out of Rexulti bc cost with new year was going to be $1000.   Overall still doing fine and has been since last seen.  Not noticed any changes so far with switch from Rexulti to pramipexole. Sleep is better.  Awakens but back to sleep.  Depression and anxiety are manageable. Patient reports stable mood and denies depressed or irritable moods.  Patient denies any recent difficulty with anxiety.  Patient denies difficulty with sleep initiation or maintenance. Denies appetite disturbance.  Patient reports that energy and motivation have been good.  Patient denies any difficulty with concentration.  Patient denies any suicidal ideation. No SE Plan: Clearly better with Rexulti 2 mg in addition to sertraline 200 mg daily. So far has been OK with 3 week change to pramipexole but he could still  relaps bc of long half life of Rexulti. Switch for sleep to trazodone.  01/07/21 appt noted: Ok overall.  Noticed some things he just wrote off like finger twitching and now resting tremor on R hand and when does something it goes away.  Went to PCP and awaiting referral to neurologist.  Also had some balance issues for some months also and wonders about PD.   Depression is OK.  Caffeine as late as bedtime.  Coke Zero and diet Mtn Dew. Plan: No med  changes  03/19/2021 appointment with the following noted: Reviewed neuro note 02/21/2021 Dr. Sherryll Davis who suspects Parkinson's disease and started carbidopa levodopa. Maybe less stiffness with Sinemet. Pretty good re: mental health since here.  Trazodone helps some with sleep. No SE Plan: Continue pramipexole 0.25 mg BID Sertraline 200 daily trazodone 100 -200 mg HS helps go asleep but doesn't stay asleep..   Switch to decaf by 3 PM.  Emphasized this  07/22/21 appt noted: Still pretty well.  Depression and anxiety pretty under control. Continue meds as above. No SE CO EFA/EMA. Awakens after 3 hours. Sometimes back to sleep.  Has OSA and on CPAP. Usually works daytime so little napping.  Patient reports stable mood and denies depressed or irritable moods.  Patient denies any recent difficulty with anxiety other than mild background levels.  His wife and her family are close and create stress.   Denies appetite disturbance.  Patient reports that energy and motivation have been good.  Patient denies any difficulty with concentration.  Patient denies any suicidal ideation.  Past Psychiatric Medication Trials: Sertraline 200, venlafaxine 225, paroxetine Abilify 10, Rexulti 2,  Pramipexole  buspirone 30 twice daily, doxazosin 8,  Sonata, trazodone 200 EMA  Review of Systems:  Review of Systems  Cardiovascular:  Negative for palpitations.  Genitourinary:  Positive for frequency.  Neurological:  Positive for tremors. Negative for dizziness and weakness.       Balance and some stiffness   Medications: I have reviewed the patient's current medications.  Current Outpatient Medications  Medication Sig Dispense Refill   carbidopa-levodopa (SINEMET IR) 25-100 MG tablet Take 1 tablet by mouth 3 (three) times daily.     hydrOXYzine (ATARAX/VISTARIL) 25 MG tablet Take 1-2 tablets (25-50 mg total) by mouth at bedtime as needed (insomnia). 60 tablet 1   pramipexole (MIRAPEX) 0.25 MG tablet TAKE 1  TABLET BY MOUTH TWICE DAILY 180 tablet 1   rosuvastatin (CRESTOR) 20 MG tablet Take 20 mg by mouth daily.     sertraline (ZOLOFT) 100 MG tablet Take 2 tablets (200 mg total) by mouth daily. 180 tablet 1   No current facility-administered medications for this visit.    Medication Side Effects: None  Allergies:  Allergies  Allergen Reactions   Adhesive [Tape] Other (See Comments)    Blisters    Codeine Nausea And Vomiting   Latex Other (See Comments)    blisters    Past Medical History:  Diagnosis Date   Anxiety    Anxiety    Arthritis    Depression    Hyperlipidemia    Insomnia    Lymphadenopathy, retroperitoneal    OSA (obstructive sleep apnea)    Sciatica    Seizures (HCC)    Spinal stenosis    Spinal stenosis    Spondylisthesis     History reviewed. No pertinent family history.  Social History   Socioeconomic History   Marital status: Married    Spouse name: Not on file  Number of children: Not on file   Years of education: Not on file   Highest education level: Not on file  Occupational History   Not on file  Tobacco Use   Smoking status: Never   Smokeless tobacco: Never  Substance and Sexual Activity   Alcohol use: No   Drug use: No   Sexual activity: Yes  Other Topics Concern   Not on file  Social History Narrative   Not on file   Social Determinants of Health   Financial Resource Strain: Not on file  Food Insecurity: Not on file  Transportation Needs: Not on file  Physical Activity: Not on file  Stress: Not on file  Social Connections: Not on file  Intimate Partner Violence: Not on file    Past Medical History, Surgical history, Social history, and Family history were reviewed and updated as appropriate.   Please see review of systems for further details on the patient's review from today.   Objective:   Physical Exam:  There were no vitals taken for this visit.  Physical Exam Constitutional:      General: He is not in acute  distress.    Appearance: He is well-developed. He is obese.  Musculoskeletal:        General: No deformity.  Neurological:     Mental Status: He is alert and oriented to person, place, and time.     Coordination: Coordination normal.  Psychiatric:        Attention and Perception: Attention and perception normal. He does not perceive auditory or visual hallucinations.        Mood and Affect: Mood is anxious. Mood is not depressed. Affect is not labile, blunt, angry, tearful or inappropriate.        Speech: Speech normal.        Behavior: Behavior normal.        Thought Content: Thought content normal. Thought content does not include homicidal or suicidal ideation. Thought content does not include homicidal or suicidal plan.        Cognition and Memory: Cognition and memory normal.        Judgment: Judgment normal.     Comments: Insight intact. No delusions.      Lab Review:     Component Value Date/Time   NA 139 02/14/2021 1510   K 3.7 02/14/2021 1510   CL 102 02/14/2021 1510   CO2 28 02/14/2021 1510   GLUCOSE 87 02/14/2021 1510   BUN 13 02/14/2021 1510   CREATININE 1.25 (H) 02/14/2021 1510   CALCIUM 9.5 02/14/2021 1510   PROT 7.8 02/14/2021 1510   ALBUMIN 4.4 02/14/2021 1510   AST 27 02/14/2021 1510   ALT 29 02/14/2021 1510   ALKPHOS 57 02/14/2021 1510   BILITOT 0.6 02/14/2021 1510   GFRNONAA >60 02/14/2021 1510       Component Value Date/Time   WBC 9.0 02/14/2021 1510   RBC 4.81 02/14/2021 1510   HGB 14.5 02/14/2021 1510   HCT 42.4 02/14/2021 1510   PLT 187 02/14/2021 1510   MCV 88.1 02/14/2021 1510   MCH 30.1 02/14/2021 1510   MCHC 34.2 02/14/2021 1510   RDW 12.8 02/14/2021 1510   LYMPHSABS 2.7 02/14/2021 1510   MONOABS 0.8 02/14/2021 1510   EOSABS 0.4 02/14/2021 1510   BASOSABS 0.1 02/14/2021 1510    No results found for: POCLITH, LITHIUM   No results found for: PHENYTOIN, PHENOBARB, VALPROATE, CBMZ   .res Assessment: Plan:    Dakota Davis was  seen today  for follow-up, anxiety, depression and sleeping problem.  Diagnoses and all orders for this visit:  Depression, major, recurrent, in partial remission (HCC)  Generalized anxiety disorder  PTSD (post-traumatic stress disorder)  Insomnia due to mental condition -     hydrOXYzine (ATARAX/VISTARIL) 25 MG tablet; Take 1-2 tablets (25-50 mg total) by mouth at bedtime as needed (insomnia).  Primary parkinsonism (HCC)  Greater than 50% of 30 min face to face time with patient was spent on counseling and coordination of care. We discussed  Discussed potential metabolic side effects associated with atypical antipsychotics, as well as potential risk for movement side effects. Advised pt to contact office if movement side effects occur.  Prveviously, For whatever reason anxiety was worse than ever,   Maybe from stopping Abilify then Rexulti. Then responded to Rexulti 2 mg with resolution of sx. Had to switch to pramipexole for cost reasons. Still doing oK.  Rexulti worked better.  Good to have switched DT recent dx PD.  Disc pramipexole in reference to dx PD.  Dr. Sherryll Davis ok with it's low dose added on for off-label treatment of depression. Continue pramipexole 0.25 mg BID Sertraline 200 daily Switch trazodone bc doesn't stay asleep to hydroxyzine 25-50 mg HS.Marland Kitchen  Also gave RX Belsomra samples in case it failed. 10-20 Switch to decaf by 3 PM.  Emphasized this  He's done this   Disc SE in detail and SSRI withdrawal sx.  Supportive therapy dealing with   FU 2-3 mos.  Meredith Staggers, MD, DFAPA    Please see After Visit Summary for patient specific instructions.  No future appointments.  No orders of the defined types were placed in this encounter.   -------------------------------

## 2021-09-24 ENCOUNTER — Other Ambulatory Visit: Payer: Self-pay | Admitting: Internal Medicine

## 2021-09-24 DIAGNOSIS — G8929 Other chronic pain: Secondary | ICD-10-CM

## 2021-09-24 DIAGNOSIS — R1011 Right upper quadrant pain: Secondary | ICD-10-CM

## 2021-09-30 ENCOUNTER — Ambulatory Visit: Payer: BC Managed Care – PPO | Admitting: Psychiatry

## 2021-10-01 ENCOUNTER — Other Ambulatory Visit: Payer: Self-pay

## 2021-10-01 ENCOUNTER — Ambulatory Visit: Payer: BC Managed Care – PPO | Attending: Neurology | Admitting: Speech Pathology

## 2021-10-01 DIAGNOSIS — R41841 Cognitive communication deficit: Secondary | ICD-10-CM

## 2021-10-01 DIAGNOSIS — R1312 Dysphagia, oropharyngeal phase: Secondary | ICD-10-CM

## 2021-10-01 DIAGNOSIS — R471 Dysarthria and anarthria: Secondary | ICD-10-CM | POA: Diagnosis present

## 2021-10-01 DIAGNOSIS — G20A1 Parkinson's disease without dyskinesia, without mention of fluctuations: Secondary | ICD-10-CM

## 2021-10-01 DIAGNOSIS — G20 Parkinson's disease: Secondary | ICD-10-CM | POA: Diagnosis present

## 2021-10-02 ENCOUNTER — Ambulatory Visit
Admission: RE | Admit: 2021-10-02 | Discharge: 2021-10-02 | Disposition: A | Discharge status: Home / Self Care | Payer: BC Managed Care – PPO | Source: Ambulatory Visit | Attending: Internal Medicine | Admitting: Internal Medicine

## 2021-10-02 DIAGNOSIS — G8929 Other chronic pain: Secondary | ICD-10-CM | POA: Insufficient documentation

## 2021-10-02 DIAGNOSIS — R1011 Right upper quadrant pain: Secondary | ICD-10-CM | POA: Diagnosis present

## 2021-10-02 NOTE — Therapy (Signed)
[facility] MAIN [facility] SERVICES [address] [place], [place], [address] Phone: [phone]   Fax:  [phone]  Speech Language Pathology Evaluation  Patient Details  Name: Dakota Davis MRN: [id number] Date of Birth: 08-06-1966 Referring Provider (SLP): Cristopher Peru   Encounter Date: 10/01/2021   End of Session - 10/02/21 1239     Visit Number 1    Number of Visits 24    Date for SLP Re-Evaluation 12/25/21    Authorization Type [employer] [facility] PPO    Authorization Time Period 10/01/2021 thru 12/25/2021    Authorization - Visit Number 1    Progress Note Due on Visit 10    SLP Start Time 1000    SLP Stop Time  1100    SLP Time Calculation (min) 60 min    Activity Tolerance Patient tolerated treatment well             Past Medical History:  Diagnosis Date   Anxiety    Anxiety    Arthritis    Depression    Hyperlipidemia    Insomnia    Lymphadenopathy, retroperitoneal    OSA (obstructive sleep apnea)    Sciatica    Seizures (HCC)    Spinal stenosis    Spinal stenosis    Spondylisthesis     Past Surgical History:  Procedure Laterality Date   BACK SURGERY     BACK SURGERY     10/31/2016,12/21/2015   KNEE ARTHROSCOPY     2018   LEFT HEART CATH AND CORONARY ANGIOGRAPHY N/A 08/05/2017   Procedure: LEFT HEART CATH AND CORONARY ANGIOGRAPHY;  Surgeon: Lamar Blinks, MD;  Location: [facility] INVASIVE CV LAB;  Service: Cardiovascular;  Laterality: N/A;   TONSILECTOMY/ADENOIDECTOMY WITH MYRINGOTOMY      There were no vitals filed for this visit.   Subjective Assessment - 10/02/21 1233     Subjective pt pleasant, good historian, known to this writer    Currently in Pain? No/denies                SLP Evaluation [facility] - 10/02/21 1233       SLP Visit Information   SLP Received On 10/01/21    Referring Provider (SLP) Cristopher Peru    Onset Date 0/05/2021    Medical Diagnosis Parkinson's Disease       General Information   HPI Pt referred by Dr Cristopher Peru for ST evaluation on  02/21/2021 d/t Progressive Dysphagia with unspecified esophageal motility disorder noted on barium swallow in 2015 and hypophonia. Most recent imaging - MRI Brain 02/14/2021 was negative for any acute intracranial abnormality with a few small scattered foci of T2/FLAIR hyperintensity within the cerebral white matter are nonspecific, but most commonly secondary to chronic small vessel ischemia.    Behavioral/Cognition appropriate    Mobility Status ambulatory      Balance Screen   Has the patient fallen in the past 6 months No    Has the patient had a decrease in activity level because of a fear of falling?  No    Is the patient reluctant to leave their home because of a fear of falling?  No      Prior Functional Status   Cognitive/Linguistic Baseline Within functional limits      Cognition   Overall Cognitive Status Within Functional Limits for tasks assessed      Auditory Comprehension   Overall Auditory Comprehension Appears within functional limits for tasks  assessed      Reading Comprehension   Reading Status Within funtional limits      Expression   Primary Mode of Expression Verbal      Verbal Expression   Overall Verbal Expression Appears within functional limits for tasks assessed      Written Expression   Dominant Hand Right    Written Expression Not tested      Oral Motor/Sensory Function   Overall Oral Motor/Sensory Function Appears within functional limits for tasks assessed      Motor Speech   Overall Motor Speech Impaired                             SLP Education - 10/02/21 1238     Education Details Parkinson's disease and oropharyngeal dysphagia    Person(s) Educated Patient    Methods Explanation;Demonstration    Comprehension Verbalized understanding;Need further instruction                SLP Long Term Goals - 10/02/21 1246       SLP LONG  TERM GOAL #1   Title Pt will complete an instrumental swallow assessment.    Time 12    Period Weeks    Status New    Target Date 12/25/21              Plan - 10/02/21 1240     Clinical Impression Statement Pt presents with intelligible speech that although mildly fast rate is > 95% intelligible in conversation with appropriate vocal intensity at 75dB. Pt also reports progressive dysphagia with difficulty "manipulating food to chew it enough" and as having "not enough power to get it (food) down." He denies any difficulty with thin liquids. Per pt report, he has been avoiding all meats, his oral phase is so lengthy that he is also the last one to finish eating and that he frequently "just gives up" and stops eating. During today's evaluation, pt presented with s/s concerning for oropharyngeal dysphagia. When consuming graham crackers with peanut butter, pt's oral phase was lengthy with pt appearing to use a lot of effort to move bolus posteriorly and swallow. Pt frequently grimaced with effort. When consuming thin liquids, pt was free of overt s/s of aspiration. Given the significant struggle that is visible when consuming the above trials, recommend proceeding with a Modified Barium Swallow Study to better assess pt's oropharyngeal dysphagia. Will seek order.    Speech Therapy Frequency 2x / week   d/t high copay, will place pt on hold pending Modified Barium Swallow Study   Duration 12 weeks    Treatment/Interventions Aspiration precaution training;Diet toleration management by SLP;SLP instruction and feedback;Patient/family education    Potential to Achieve Goals Good    Consulted and Agree with Plan of Care Patient             Patient will benefit from skilled therapeutic intervention in order to improve the following deficits and impairments:   Cognitive communication deficit  Dysarthria and anarthria  Dysphagia, oropharyngeal phase  Parkinson disease (HCC)    Problem  List Patient Active Problem List   Diagnosis Date Noted   GAD (generalized anxiety disorder) 07/04/2018   PTSD (post-traumatic stress disorder) 07/04/2018   Stable angina (HCC) 08/03/2017   [place] Fritze B. Dreama Saaverton, M.S., CCC-SLP, [facility] Speech-Language Pathologist Rehabilitation Services Office [phone]  Reuel DerbyHappi Gia Lusher, CCC-SLP 10/02/2021, 12:47 PM  [place] [facility] [address]  [place], [place], [address] Phone: [phone]   Fax:  [phone]  Name: Dakota Davis MRN: [id number] Date of Birth: 04-05-1966

## 2021-10-03 ENCOUNTER — Other Ambulatory Visit: Payer: Self-pay | Admitting: Internal Medicine

## 2021-10-03 DIAGNOSIS — R1313 Dysphagia, pharyngeal phase: Secondary | ICD-10-CM

## 2021-10-07 ENCOUNTER — Encounter: Payer: BC Managed Care – PPO | Admitting: Speech Pathology

## 2021-10-09 ENCOUNTER — Other Ambulatory Visit: Payer: Self-pay

## 2021-10-09 ENCOUNTER — Ambulatory Visit
Admission: RE | Admit: 2021-10-09 | Discharge: 2021-10-09 | Disposition: A | Discharge status: Home / Self Care | Payer: BC Managed Care – PPO | Source: Ambulatory Visit | Attending: Internal Medicine | Admitting: Internal Medicine

## 2021-10-09 ENCOUNTER — Encounter: Payer: BC Managed Care – PPO | Admitting: Speech Pathology

## 2021-10-09 DIAGNOSIS — R1313 Dysphagia, pharyngeal phase: Secondary | ICD-10-CM

## 2021-10-10 ENCOUNTER — Ambulatory Visit
Admission: RE | Admit: 2021-10-10 | Discharge: 2021-10-10 | Disposition: A | Discharge status: Home / Self Care | Payer: BC Managed Care – PPO | Source: Ambulatory Visit | Attending: Internal Medicine | Admitting: Internal Medicine

## 2021-10-10 DIAGNOSIS — R1313 Dysphagia, pharyngeal phase: Secondary | ICD-10-CM | POA: Insufficient documentation

## 2021-10-10 DIAGNOSIS — R1312 Dysphagia, oropharyngeal phase: Secondary | ICD-10-CM | POA: Insufficient documentation

## 2021-10-11 NOTE — Progress Notes (Signed)
Modified Barium Swallow Progress Note  Patient Details  Name: Tannor Pyon MRN: [id number] Date of Birth: September 02, 1966  Today's Date: 10/11/2021  Modified Barium Swallow completed.  Full report located under Chart Review in the Imaging Section.  Brief recommendations include the following:  Clinical Impression  Pt presents with adequate oropharyngeal abilities when consuming thin liquids via cup, puree, graham crackers and whole barium tablet with thin liquids. Despite concerns identified during pt's Clinical Swallow Evaluation regarding his oral phase, it was in fact functional across all consistencies. Pt's pharyngeal phase was within normal limits with good airway protection throughout study. During brief esophageal sweep, there appeared to be statis of bolus in upper esophagus (Image #15). After study was completed, pt and therapist reviewed video in SLP office. Throughout the 30 minute meeting, pt with continuous throat clearing that was not present prior to consuming PO intake. These throat clears were not associated with dysphagia or aspiration. Education provided on reflux precautions - all of which pt is already following without resolution of symptoms. Given pt's symptoms and the imaging above, recommend pt continue following up with his GI regarding possible reflux. All questions answered to pt's satisfaction.   Swallow Evaluation Recommendations   Recommended Consults:  (continue following with GI)   SLP Diet Recommendations: Regular solids;Thin liquid   Liquid Administration via: Cup;Straw   Medication Administration: Whole meds with liquid   Supervision: Patient able to self feed   Compensations: Minimize environmental distractions;Slow rate;Small sips/bites   Postural Changes: Seated upright at 90 degrees;Remain semi-upright after after feeds/meals (Comment)   Oral Care Recommendations: Oral care BID      Makailah Slavick B. Dreama Saa M.S., CCC-SLP, [facility] Speech-Language  Pathologist Rehabilitation Services Office [phone]   Kalley Nicholl 10/11/2021,5:48 PM

## 2021-10-17 ENCOUNTER — Encounter: Payer: BC Managed Care – PPO | Admitting: Speech Pathology

## 2021-10-22 ENCOUNTER — Encounter: Payer: BC Managed Care – PPO | Admitting: Speech Pathology

## 2021-10-23 ENCOUNTER — Emergency Department: Payer: BC Managed Care – PPO

## 2021-10-23 ENCOUNTER — Encounter: Payer: Self-pay | Admitting: Emergency Medicine

## 2021-10-23 ENCOUNTER — Emergency Department
Admission: EM | Admit: 2021-10-23 | Discharge: 2021-10-23 | Disposition: A | Discharge status: Home / Self Care | Payer: BC Managed Care – PPO | Attending: Emergency Medicine | Admitting: Emergency Medicine

## 2021-10-23 ENCOUNTER — Other Ambulatory Visit: Payer: Self-pay

## 2021-10-23 DIAGNOSIS — R197 Diarrhea, unspecified: Secondary | ICD-10-CM | POA: Diagnosis not present

## 2021-10-23 DIAGNOSIS — R1011 Right upper quadrant pain: Secondary | ICD-10-CM | POA: Insufficient documentation

## 2021-10-23 LAB — COMPREHENSIVE METABOLIC PANEL
ALT: 6 U/L (ref 0–44)
AST: 23 U/L (ref 15–41)
Albumin: 4.5 g/dL (ref 3.5–5.0)
Alkaline Phosphatase: 57 U/L (ref 38–126)
Anion gap: 8 (ref 5–15)
BUN: 12 mg/dL (ref 6–20)
CO2: 27 mmol/L (ref 22–32)
Calcium: 9.4 mg/dL (ref 8.9–10.3)
Chloride: 102 mmol/L (ref 98–111)
Creatinine, Ser: 1.22 mg/dL (ref 0.61–1.24)
GFR, Estimated: >60 mL/min (ref >60)
Glucose, Bld: 144 mg/dL — ABNORMAL HIGH (ref 70–99)
Potassium: 3.7 mmol/L (ref 3.5–5.1)
Sodium: 137 mmol/L (ref 135–145)
Total Bilirubin: 0.8 mg/dL (ref 0.3–1.2)
Total Protein: 7.7 g/dL (ref 6.5–8.1)

## 2021-10-23 LAB — URINALYSIS, ROUTINE W REFLEX MICROSCOPIC
Bacteria, UA: NONE SEEN
Bilirubin Urine: NEGATIVE
Glucose, UA: NEGATIVE mg/dL
Hgb urine dipstick: NEGATIVE
Ketones, ur: NEGATIVE mg/dL
Nitrite: NEGATIVE
Protein, ur: NEGATIVE mg/dL
Specific Gravity, Urine: 1.021 (ref 1.005–1.030)
Squamous Epithelial / HPF: NONE SEEN (ref 0–5)
pH: 6 (ref 5.0–8.0)

## 2021-10-23 LAB — LIPASE, BLOOD: Lipase: 31 U/L (ref 11–51)

## 2021-10-23 LAB — CBC
HCT: 42.6 % (ref 39.0–52.0)
Hemoglobin: 14.5 g/dL (ref 13.0–17.0)
MCH: 30.8 pg (ref 26.0–34.0)
MCHC: 34 g/dL (ref 30.0–36.0)
MCV: 90.4 fL (ref 80.0–100.0)
Platelets: 198 10*3/uL (ref 150–400)
RBC: 4.71 MIL/uL (ref 4.22–5.81)
RDW: 12.7 % (ref 11.5–15.5)
WBC: 7.9 10*3/uL (ref 4.0–10.5)
nRBC: 0 % (ref 0.0–0.2)

## 2021-10-23 LAB — TROPONIN I (HIGH SENSITIVITY): Troponin I (High Sensitivity): 5 ng/L (ref <18)

## 2021-10-23 MED ORDER — HYDROMORPHONE HCL 1 MG/ML IJ SOLN
1.0000 mg | Freq: Once | INTRAMUSCULAR | Status: AC
  Administered 2021-10-23: 1 mg via INTRAVENOUS
  Filled 2021-10-23: qty 1

## 2021-10-23 MED ORDER — HYDROCODONE-ACETAMINOPHEN 5-325 MG PO TABS: 1.0000 | ORAL_TABLET | Freq: Four times a day (QID) | ORAL | 0 refills | Status: DC | PRN

## 2021-10-23 MED ORDER — ONDANSETRON HCL 4 MG/2ML IJ SOLN
4.0000 mg | Freq: Once | INTRAMUSCULAR | Status: AC
Start: 2021-10-23 — End: 2021-10-23
  Administered 2021-10-23: 4 mg via INTRAVENOUS
  Filled 2021-10-23: qty 2

## 2021-10-23 MED ORDER — DICYCLOMINE HCL 20 MG PO TABS: 20.0000 mg | ORAL_TABLET | Freq: Three times a day (TID) | ORAL | 0 refills | Status: AC

## 2021-10-23 MED ORDER — IOHEXOL 300 MG/ML  SOLN
100.0000 mL | Freq: Once | INTRAMUSCULAR | Status: AC | PRN
  Administered 2021-10-23: 100 mL via INTRAVENOUS

## 2021-10-23 NOTE — ED Notes (Signed)
EDP at [facility] discussing results, meds, plan.

## 2021-10-23 NOTE — ED Triage Notes (Signed)
Pt comes into the ED via POV c/o right side abd pain that radiates into his back.  Pt also reports swelling and distention on that side of the abdomen.  Pt has been f/u with GI, and states he had an [place] completed 2 weeks ago that didn't show anything.  He now is scheduled for an endoscopy, but it isn't scheduled until March 15.  Pt admits to nausea, but no episodes of emesis.  Pt denies any diarrhea. PT still has an appendix and gallbladder.

## 2021-10-23 NOTE — ED Provider Notes (Signed)
[facility] Provider Note    Event Date/Time   First MD Initiated Contact with Patient 10/23/21 1504     (approximate)   History   Abdominal Pain and Back Pain   HPI  Dakota PoserWilliam Sliney Davis is a 56 y.o. male here with right upper quadrant abdominal pain.  Patient states that over the last several weeks, he has had progressively worsening, persistent, right upper quadrant abdominal pain.  He states he feels very bloated and fullness area.  This been ongoing issue and he is actually scheduled to see GI for an EGD in the next several months.  He states that the pain is worse with swallowing.  He states he also noticed that it is slightly worse with certain position changes and feels like it is bulging when he bends down.  No nausea.  The pain is increasing and severe.  He has not noticed any specific alleviating factors.  No history of ulcers.  Denies any blood in his stools.  His stools actually been more so loose.  He tried Linzess without significant relief and it caused significant diarrhea so he is no longer taking this.      Physical Exam   Triage Vital Signs: ED Triage Vitals  Enc Vitals Group     BP 10/23/21 1338 (!) 164/107     Pulse Rate 10/23/21 1338 91     Resp 10/23/21 1338 18     Temp 10/23/21 1338 98.2 F (36.8 C)     Temp Source 10/23/21 1338 Oral     SpO2 10/23/21 1338 97 %     Weight 10/23/21 1339 242 lb 1 oz (109.8 kg)     Height 10/23/21 1339 6' (1.829 m)     Head Circumference --      Peak Flow --      Pain Score 10/23/21 1338 8     Pain Loc --      Pain Edu? --      Excl. in GC? --     Most recent vital signs: Vitals:   10/23/21 1338 10/23/21 1602  BP: (!) 164/107 (!) 145/96  Pulse: 91 78  Resp: 18 18  Temp: 98.2 F (36.8 C) 98.9 F (37.2 C)  SpO2: 97% 94%     General: Awake, no distress.  CV:  Good peripheral perfusion.  Resp:  Normal effort.  Lungs clear to auscultation bilaterally. Abd:  Moderate tenderness to  palpation over the right upper quadrant.  Patient subjectively feels swelling though no appreciable hernia or asymmetry is noted in this area, though somewhat limited by habitus.  Bowel sounds are slightly hyperactive.  No guarding or rebound. Other:  No skin rashes or lesions.   ED Results / Procedures / Treatments   Labs (all labs ordered are listed, but only abnormal results are displayed) Labs Reviewed  COMPREHENSIVE METABOLIC PANEL - Abnormal; Notable for the following components:      Result Value   Glucose, Bld 144 (*)    All other components within normal limits  URINALYSIS, ROUTINE W REFLEX MICROSCOPIC - Abnormal; Notable for the following components:   Color, Urine STRAW (*)    APPearance CLEAR (*)    Leukocytes,Ua TRACE (*)    All other components within normal limits  LIPASE, BLOOD  CBC  TROPONIN I (HIGH SENSITIVITY)     EKG Normal sinus rhythm, ventricular rate 74.  PR 120, QRS 92, QTc 421.  No acute ST elevations or depressions.   RADIOLOGY  CT abdomen/pelvis: No acute abnormality   I also independently reviewed and agree wit radiologist interpretations.   PROCEDURES:  Critical Care performed: No   MEDICATIONS ORDERED IN ED: Medications  HYDROmorphone (DILAUDID) injection 1 mg (1 mg Intravenous Given 10/23/21 1549)  ondansetron (ZOFRAN) injection 4 mg (4 mg Intravenous Given 10/23/21 1549)  iohexol (OMNIPAQUE) 300 MG/ML solution 100 mL (100 mLs Intravenous Contrast Given 10/23/21 1634)     IMPRESSION / MDM / ASSESSMENT AND PLAN / ED COURSE  I reviewed the triage vital signs and the nursing notes.                               Ddx:  Gastritis, PUD, gastroparesis, IBS, acalculous biliary colic, abdominal wall/musculoskeletal pain   MDM:  Very well-appearing 55 year old male here with right upper quadrant abdominal pain.  This has been a fairly ongoing issue for which she is currently being seen by Dr. Alice Reichert with GI.  I reviewed his notes and he  scheduled for an EGD next month.  Here, exam is fairly reassuring.  CBC shows no leukocytosis or anemia.  CMP unremarkable with normal LFTs and renal function.  UA shows no UTI.  Troponin negative, EKG nonischemic and I do not suspect referred cardiac source.  CT obtained, reviewed, shows no evidence of acute pathology.  He feels better after analgesia here.  He does endorse some symptoms consistent with IBS, will trial Bentyl and have him call GI to let them know he was seen in the ED.  Brief course of analgesics given as needed.  Otherwise, no apparent emergent pathology.   MEDICATIONS GIVEN IN ED: Medications  HYDROmorphone (DILAUDID) injection 1 mg (1 mg Intravenous Given 10/23/21 1549)  ondansetron (ZOFRAN) injection 4 mg (4 mg Intravenous Given 10/23/21 1549)  iohexol (OMNIPAQUE) 300 MG/ML solution 100 mL (100 mLs Intravenous Contrast Given 10/23/21 1634)     Consults:  None   EMR reviewed  GI notes with Dr. Alice Reichert, scheduled EGD next month     FINAL CLINICAL IMPRESSION(S) / ED DIAGNOSES   Final diagnoses:  RUQ abdominal pain     Rx / DC Orders   ED Discharge Orders          Ordered    dicyclomine (BENTYL) 20 MG tablet  3 times daily before meals & bedtime        10/23/21 1738    HYDROcodone-acetaminophen (NORCO/VICODIN) 5-325 MG tablet  Every 6 hours PRN        10/23/21 1738             Note:  This document was prepared using Dragon voice recognition software and may include unintentional dictation errors.   Duffy Bruce, MD 10/23/21 Lurena Nida

## 2021-10-23 NOTE — ED Notes (Signed)
Back from CT. Up to b/r. No changes, alert, NAD, calm, interactive. BAck to stretcher, steady gait.

## 2021-10-25 ENCOUNTER — Encounter: Payer: BC Managed Care – PPO | Admitting: Speech Pathology

## 2021-10-29 ENCOUNTER — Encounter: Payer: BC Managed Care – PPO | Admitting: Speech Pathology

## 2021-10-31 ENCOUNTER — Encounter: Payer: BC Managed Care – PPO | Admitting: Speech Pathology

## 2021-11-12 ENCOUNTER — Ambulatory Visit: Payer: BC Managed Care – PPO | Admitting: Psychiatry

## 2021-11-12 ENCOUNTER — Other Ambulatory Visit: Payer: Self-pay

## 2021-11-12 ENCOUNTER — Encounter: Payer: Self-pay | Admitting: Psychiatry

## 2021-11-12 DIAGNOSIS — F4310 Post-traumatic stress disorder, unspecified: Secondary | ICD-10-CM | POA: Diagnosis not present

## 2021-11-12 DIAGNOSIS — F411 Generalized anxiety disorder: Secondary | ICD-10-CM

## 2021-11-12 DIAGNOSIS — G20 Parkinson's disease: Secondary | ICD-10-CM

## 2021-11-12 DIAGNOSIS — F5105 Insomnia due to other mental disorder: Secondary | ICD-10-CM

## 2021-11-12 DIAGNOSIS — F3341 Major depressive disorder, recurrent, in partial remission: Secondary | ICD-10-CM

## 2021-11-12 MED ORDER — PRAMIPEXOLE DIHYDROCHLORIDE 0.25 MG PO TABS: ORAL_TABLET | ORAL | 1 refills | Status: DC

## 2021-11-12 MED ORDER — SERTRALINE HCL 100 MG PO TABS: 200.0000 mg | ORAL_TABLET | Freq: Every day | ORAL | 1 refills | Status: DC

## 2021-11-12 MED ORDER — QUVIVIQ 50 MG PO TABS: 1.0000 | ORAL_TABLET | Freq: Every evening | ORAL | 1 refills | Status: DC

## 2021-11-12 MED ORDER — TRAZODONE HCL 100 MG PO TABS: 200.0000 mg | ORAL_TABLET | Freq: Every evening | ORAL | 0 refills | Status: DC | PRN

## 2021-11-12 NOTE — Patient Instructions (Signed)
Start [phone] for sleep

## 2021-11-12 NOTE — Progress Notes (Signed)
Dakota Davis [id number] 1966-08-04 56 y.o.  Subjective:   Patient ID:  Dakota Davis is a 56 y.o. (DOB February 15, 1966) male.  Chief Complaint:  Chief Complaint  Patient presents with   Follow-up   Depression   Anxiety   Sleeping Problem    Depression        Past medical history includes anxiety.   Anxiety Patient reports no dizziness or palpitations.    Dakota Davis presents to the office today for follow-up of generalized anxiety disorder with elements of PTSD and major depression.  seen October 2020.  No meds were changed.  He remained on sertraline 200, aripiprazole 10 mg and Sonata or trazodone as needed insomnia.  January 11, 2020 appointment the following is noted: Had Covid Feb with flu-like sx and fatigue lasted several days. At times feels he's slipping into a bit more depression with less motivation and isolating.  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  Patient reports stable mood and denies depressed or irritable moods.  Patient with residual anxiety which sometimes interferes with sleep.  Sonata helps prn.  2-3 days a week can be hard to shut off brain.  Occ EFA.Marland Kitchen Usually 7-8 hour of sleep.  OCc daytime tiredness. Denies appetite disturbance.  Patient reports that energy and motivation have been good.  Patient denies any difficulty with concentration.  Patient denies any suicidal ideation. Plan:  He wanted to try Rexulti 2 mg augmentation  05/14/20 appt with the following noted: Rexulti for 2 mos helped but cost $250/mo copay and couldn't afford.  Felt worse off of it. Anxiety out the roof with uncertain cause. Seems worse than ever.  Varying subjects for worry.  Can't seem to get a hold of it.  Wife notices he's distant for weeks. Rexulti helped significantly.   Plan: sampled Rexulti  07/11/2020 appointment with the following noted: Been able to stay on Rexulti and is better on it.  Clear difference.  Anxiety is  better and manageable.  Mild depression comes and goes and manageable. No SE. Sleep not great with trouble staying asleep.  EFA with variable times.   Plan: Clearly better with Rexulti 2 mg in addition to sertraline 200 mg daily. Recommend a trial of trazodone at 100 250 mg nightly for insomnia  09/21/2020 phone call from patient stating he could no longer afford Rexulti.  He had already failed Abilify.  He is likely to be unable to afford the other partial dopamine agonist Vraylar.  Therefore initiated off label trial of pramipexole to take the place of the Rexulti.  Patient has a pending appointment in 3 weeks.  10/11/2020 appointment with the following noted: Ran out of Rexulti bc cost with new year was going to be $1000.   Overall still doing fine and has been since last seen.  Not noticed any changes so far with switch from Rexulti to pramipexole. Sleep is better.  Awakens but back to sleep.  Depression and anxiety are manageable. Patient reports stable mood and denies depressed or irritable moods.  Patient denies any recent difficulty with anxiety.  Patient denies difficulty with sleep initiation or maintenance. Denies appetite disturbance.  Patient reports that energy and motivation have been good.  Patient denies any difficulty with concentration.  Patient denies any suicidal ideation. No SE Plan: Clearly better with Rexulti 2 mg in addition to sertraline 200 mg daily. So far has been OK with 3 week change to pramipexole but he could still  relaps bc of long half life of Rexulti. Switch for sleep to trazodone.  01/07/21 appt noted: Ok overall.  Noticed some things he just wrote off like finger twitching and now resting tremor on R hand and when does something it goes away.  Went to PCP and awaiting referral to neurologist.  Also had some balance issues for some months also and wonders about PD.   Depression is OK.  Caffeine as late as bedtime.  Coke Zero and diet Mtn Dew. Plan: No med  changes  03/19/2021 appointment with the following noted: Reviewed neuro note 02/21/2021 Dr. Sherryll BurgerShah who suspects Parkinson's disease and started carbidopa levodopa. Maybe less stiffness with Sinemet. Pretty good re: mental health since here.  Trazodone helps some with sleep. No SE Plan: Continue pramipexole 0.25 mg BID Sertraline 200 daily trazodone 100 -200 mg HS helps go asleep but doesn't stay asleep..   Switch to decaf by 3 PM.  Emphasized this  07/22/21 appt noted: Still pretty well.  Depression and anxiety pretty under control. Continue meds as above. No SE CO EFA/EMA. Awakens after 3 hours. Sometimes back to sleep.  Has OSA and on CPAP. Usually works daytime so little napping. Plan: Continue pramipexole 0.25 mg BID Sertraline 200 daily Switch trazodone bc doesn't stay asleep to hydroxyzine 25-50 mg HS.Marland Kitchen.  Also gave RX Belsomra samples in case it failed. 10-20 Switch to decaf by 3 PM.  Emphasized this  He's done this  11/12/21 appt noted: Hydroxyzine works but hangover  and lingers through the day with 25 mg. Belsomra also hangover but did keep him asleep better. Sleep same ith decent effect initially with trazodone.  Sleep is not as good as needed and some irritability.   Mood pretty fair without major problems. Patient reports stable mood and denies depressed or irritable moods.  Patient denies any recent difficulty with anxiety other than mild background levels.  His wife and her family are close and create stress.   Denies appetite disturbance.  Patient reports that energy and motivation have been good.  Patient denies any difficulty with concentration.  Patient denies any suicidal ideation.  Past Psychiatric Medication Trials: Sertraline 200, venlafaxine 225, paroxetine Abilify 10, Rexulti 2,  Pramipexole  buspirone 30 twice daily, doxazosin 8,  Sonata, trazodone 200 EMA, hydroxyzine 25 hangover, Belsomra hangover  Review of Systems:  Review of Systems  Cardiovascular:   Negative for palpitations.  Gastrointestinal:  Positive for abdominal pain.  Genitourinary:  Positive for frequency.  Neurological:  Positive for tremors. Negative for dizziness and weakness.       Balance and some stiffness  Psychiatric/Behavioral:  Positive for sleep disturbance.    Medications: I have reviewed the patient's current medications.  Current Outpatient Medications  Medication Sig Dispense Refill   carbidopa-levodopa (SINEMET IR) 25-100 MG tablet Take 1 tablet by mouth 3 (three) times daily.     Daridorexant HCl (QUVIVIQ) 50 MG TABS Take 1 tablet by mouth at bedtime. 30 tablet 1   HYDROcodone-acetaminophen (NORCO/VICODIN) 5-325 MG tablet Take 1-2 tablets by mouth every 6 (six) hours as needed for moderate pain or severe pain (no more than 6 tabs daily). 12 tablet 0   rosuvastatin (CRESTOR) 20 MG tablet Take 20 mg by mouth daily.     dicyclomine (BENTYL) 20 MG tablet Take 1 tablet (20 mg total) by mouth 4 (four) times daily -  before meals and at bedtime for 10 days. 40 tablet 0   pramipexole (MIRAPEX) 0.25 MG tablet TAKE 1 TABLET  BY MOUTH TWICE DAILY 180 tablet 1   sertraline (ZOLOFT) 100 MG tablet Take 2 tablets (200 mg total) by mouth daily. 180 tablet 1   traZODone (DESYREL) 100 MG tablet Take 2 tablets (200 mg total) by mouth at bedtime as needed for sleep. 90 tablet 0   No current facility-administered medications for this visit.    Medication Side Effects: None  Allergies:  Allergies  Allergen Reactions   Adhesive [Tape] Other (See Comments)    Blisters    Codeine Nausea And Vomiting   Latex Other (See Comments)    blisters    Past Medical History:  Diagnosis Date   Anxiety    Anxiety    Arthritis    Depression    Hyperlipidemia    Insomnia    Lymphadenopathy, retroperitoneal    OSA (obstructive sleep apnea)    Sciatica    Seizures (HCC)    Spinal stenosis    Spinal stenosis    Spondylisthesis     History reviewed. No pertinent family  history.  Social History   Socioeconomic History   Marital status: Married    Spouse name: Not on file   Number of children: Not on file   Years of education: Not on file   Highest education level: Not on file  Occupational History   Not on file  Tobacco Use   Smoking status: Never   Smokeless tobacco: Never  Substance and Sexual Activity   Alcohol use: No   Drug use: No   Sexual activity: Yes  Other Topics Concern   Not on file  Social History Narrative   Not on file   Social Determinants of Health   Financial Resource Strain: Not on file  Food Insecurity: Not on file  Transportation Needs: Not on file  Physical Activity: Not on file  Stress: Not on file  Social Connections: Not on file  Intimate Partner Violence: Not on file    Past Medical History, Surgical history, Social history, and Family history were reviewed and updated as appropriate.   Please see review of systems for further details on the patient's review from today.   Objective:   Physical Exam:  There were no vitals taken for this visit.  Physical Exam Constitutional:      General: He is not in acute distress.    Appearance: He is well-developed. He is obese.  Musculoskeletal:        General: No deformity.  Neurological:     Mental Status: He is alert and oriented to person, place, and time.     Coordination: Coordination normal.  Psychiatric:        Attention and Perception: Attention and perception normal. He does not perceive auditory or visual hallucinations.        Mood and Affect: Mood is anxious. Mood is not depressed. Affect is not labile, blunt, angry, tearful or inappropriate.        Speech: Speech normal.        Behavior: Behavior normal.        Thought Content: Thought content normal. Thought content is not delusional. Thought content does not include homicidal or suicidal ideation. Thought content does not include suicidal plan.        Cognition and Memory: Cognition and memory  normal.        Judgment: Judgment normal.     Comments: Insight intact. No delusions.  More irritable without reason    Lab Review:     Component Value Date/Time  NA 137 10/23/2021 1340   K 3.7 10/23/2021 1340   CL 102 10/23/2021 1340   CO2 27 10/23/2021 1340   GLUCOSE 144 (H) 10/23/2021 1340   BUN 12 10/23/2021 1340   CREATININE 1.22 10/23/2021 1340   CALCIUM 9.4 10/23/2021 1340   PROT 7.7 10/23/2021 1340   ALBUMIN 4.5 10/23/2021 1340   AST 23 10/23/2021 1340   ALT 6 10/23/2021 1340   ALKPHOS 57 10/23/2021 1340   BILITOT 0.8 10/23/2021 1340   GFRNONAA >60 10/23/2021 1340       Component Value Date/Time   WBC 7.9 10/23/2021 1340   RBC 4.71 10/23/2021 1340   HGB 14.5 10/23/2021 1340   HCT 42.6 10/23/2021 1340   PLT 198 10/23/2021 1340   MCV 90.4 10/23/2021 1340   MCH 30.8 10/23/2021 1340   MCHC 34.0 10/23/2021 1340   RDW 12.7 10/23/2021 1340   LYMPHSABS 2.7 02/14/2021 1510   MONOABS 0.8 02/14/2021 1510   EOSABS 0.4 02/14/2021 1510   BASOSABS 0.1 02/14/2021 1510    No results found for: POCLITH, LITHIUM   No results found for: PHENYTOIN, PHENOBARB, VALPROATE, CBMZ   .res Assessment: Plan:    Dakota Davis was seen today for follow-up, depression, anxiety and sleeping problem.  Diagnoses and all orders for this visit:  Depression, major, recurrent, in partial remission (HCC) -     sertraline (ZOLOFT) 100 MG tablet; Take 2 tablets (200 mg total) by mouth daily. -     pramipexole (MIRAPEX) 0.25 MG tablet; TAKE 1 TABLET BY MOUTH TWICE DAILY  Insomnia due to mental condition -     Daridorexant HCl (QUVIVIQ) 50 MG TABS; Take 1 tablet by mouth at bedtime. -     traZODone (DESYREL) 100 MG tablet; Take 2 tablets (200 mg total) by mouth at bedtime as needed for sleep.  Generalized anxiety disorder -     sertraline (ZOLOFT) 100 MG tablet; Take 2 tablets (200 mg total) by mouth daily.  PTSD (post-traumatic stress disorder) -     sertraline (ZOLOFT) 100 MG tablet; Take  2 tablets (200 mg total) by mouth daily.  Primary parkinsonism (HCC)   Greater than 50% of 30 min face to face time with patient was spent on counseling and coordination of care. We discussed  ongoing sleep problems might be triggering irritability.    Discussed potential metabolic side effects associated with atypical antipsychotics, as well as potential risk for movement side effects. Advised pt to contact office if movement side effects occur.  responded to Rexulti 2 mg with resolution of sx. Had to switch to pramipexole for cost reasons. Still doing oK.  Rexulti worked better.  Good to have switched DT recent dx PD.  Disc pramipexole in reference to dx PD.  Dr. Sherryll Burger ok with it's low dose added on for off-label treatment of depression. Continue pramipexole 0.25 mg BID Sertraline 200 daily Switch trazodone bc doesn't stay asleep to hydroxyzine 25-50 mg HS..  Trial Quiviviq 50 mg HS Switch to decaf by 3 PM.  Emphasized this  He's done this   Disc SE in detail and SSRI withdrawal sx.  Supportive therapy dealing with   FU 2-3 mos.  Meredith Staggers, MD, DFAPA    Please see After Visit Summary for patient specific instructions.  No future appointments.  No orders of the defined types were placed in this encounter.    -------------------------------

## 2021-11-20 ENCOUNTER — Telehealth: Payer: Self-pay | Admitting: Psychiatry

## 2021-11-20 NOTE — Telephone Encounter (Signed)
Sue Lush at [employer] [facility] Support LVM @ 1:03p regarding PA for pt.  She asked that a call be made to Cover my meds to answer some questions. ? ?Ref # [id number] ? ?Next appt 5/30 ?

## 2021-11-20 NOTE — Telephone Encounter (Signed)
Please review

## 2021-11-21 NOTE — Telephone Encounter (Signed)
Contacted # and she just gave me the key to get a PA started. I had previously initiated one but I will have to use the one they started for Quviviq 50 mg.  ?

## 2021-11-26 ENCOUNTER — Encounter: Payer: Self-pay | Admitting: Internal Medicine

## 2021-11-26 NOTE — Telephone Encounter (Signed)
Prior Authorization submitted for QUVIVIQ 50 mg, DENIED due to requirements not met Formulary Alternatives are : doxepin, eszopiclone, ramelteon, zolpidem, zolpidem er, Belsomra, Dayvigo. ( Requirement: 3 in a class with 3 or more alternatives, 2 in a class with 2 alternatives, or 1 in a class with only 1 alternative.)  ? ? ?[facility]; [place] ?

## 2021-11-27 ENCOUNTER — Ambulatory Visit: Payer: BC Managed Care – PPO | Admitting: Certified Registered"

## 2021-11-27 ENCOUNTER — Other Ambulatory Visit: Payer: Self-pay

## 2021-11-27 ENCOUNTER — Other Ambulatory Visit: Payer: Self-pay | Admitting: Psychiatry

## 2021-11-27 ENCOUNTER — Encounter: Payer: Self-pay | Admitting: Internal Medicine

## 2021-11-27 ENCOUNTER — Ambulatory Visit
Admission: RE | Admit: 2021-11-27 | Discharge: 2021-11-27 | Disposition: A | Discharge status: Home / Self Care | Payer: BC Managed Care – PPO | Attending: Internal Medicine | Admitting: Internal Medicine

## 2021-11-27 ENCOUNTER — Encounter
Admission: RE | Disposition: A | Discharge status: Home / Self Care | Payer: Self-pay | Source: Home / Self Care | Attending: Internal Medicine

## 2021-11-27 DIAGNOSIS — K319 Disease of stomach and duodenum, unspecified: Secondary | ICD-10-CM | POA: Diagnosis not present

## 2021-11-27 DIAGNOSIS — G4733 Obstructive sleep apnea (adult) (pediatric): Secondary | ICD-10-CM | POA: Insufficient documentation

## 2021-11-27 DIAGNOSIS — Z79899 Other long term (current) drug therapy: Secondary | ICD-10-CM | POA: Diagnosis not present

## 2021-11-27 DIAGNOSIS — K2970 Gastritis, unspecified, without bleeding: Secondary | ICD-10-CM | POA: Insufficient documentation

## 2021-11-27 DIAGNOSIS — Z87891 Personal history of nicotine dependence: Secondary | ICD-10-CM | POA: Insufficient documentation

## 2021-11-27 DIAGNOSIS — F32A Depression, unspecified: Secondary | ICD-10-CM | POA: Insufficient documentation

## 2021-11-27 DIAGNOSIS — F419 Anxiety disorder, unspecified: Secondary | ICD-10-CM | POA: Diagnosis not present

## 2021-11-27 DIAGNOSIS — G20 Parkinson's disease: Secondary | ICD-10-CM | POA: Insufficient documentation

## 2021-11-27 DIAGNOSIS — R1313 Dysphagia, pharyngeal phase: Secondary | ICD-10-CM | POA: Insufficient documentation

## 2021-11-27 HISTORY — DX: Parkinson's disease without dyskinesia, without mention of fluctuations: G20.A1

## 2021-11-27 HISTORY — PX: ESOPHAGOGASTRODUODENOSCOPY: SHX5428

## 2021-11-27 HISTORY — DX: Parkinson's disease: G20

## 2021-11-27 SURGERY — EGD (ESOPHAGOGASTRODUODENOSCOPY)
Anesthesia: General

## 2021-11-27 MED ORDER — PROPOFOL 500 MG/50ML IV EMUL
INTRAVENOUS | Status: DC | PRN
  Administered 2021-11-27: 155 ug/kg/min via INTRAVENOUS

## 2021-11-27 MED ORDER — DAYVIGO 10 MG PO TABS: 10.0000 mg | ORAL_TABLET | Freq: Every evening | ORAL | 1 refills | Status: DC

## 2021-11-27 MED ORDER — LIDOCAINE HCL (CARDIAC) PF 100 MG/5ML IV SOSY
PREFILLED_SYRINGE | INTRAVENOUS | Status: DC | PRN
  Administered 2021-11-27: 100 mg via INTRAVENOUS

## 2021-11-27 MED ORDER — PROPOFOL 10 MG/ML IV BOLUS
INTRAVENOUS | Status: DC | PRN
  Administered 2021-11-27: 60 mg via INTRAVENOUS
  Administered 2021-11-27 (×2): 10 mg via INTRAVENOUS

## 2021-11-27 MED ORDER — GLYCOPYRROLATE 0.2 MG/ML IJ SOLN
INTRAMUSCULAR | Status: DC | PRN
  Administered 2021-11-27: .2 mg via INTRAVENOUS

## 2021-11-27 MED ORDER — SODIUM CHLORIDE 0.9 % IV SOLN: INTRAVENOUS | Status: DC

## 2021-11-27 NOTE — Anesthesia Postprocedure Evaluation (Signed)
Anesthesia Post Note ? ?Patient: Dakota Davis ? ?Procedure(s) Performed: ESOPHAGOGASTRODUODENOSCOPY (EGD) ? ?Patient location during evaluation: Endoscopy ?Anesthesia Type: General ?Level of consciousness: awake and alert ?Pain management: pain level controlled ?Vital Signs Assessment: post-procedure vital signs reviewed and stable ?Respiratory status: spontaneous breathing, nonlabored ventilation, respiratory function stable and patient connected to nasal cannula oxygen ?Cardiovascular status: blood pressure returned to baseline and stable ?Postop Assessment: no apparent nausea or vomiting ?Anesthetic complications: no ? ? ?No notable events documented. ? ? ?Last Vitals:  ?Vitals:  ? 11/27/21 0931 11/27/21 1047  ?BP: (!) 159/103 119/77  ?Pulse: 72 73  ?Resp: 20   ?Temp: (!) 36.2 ?C 36.6 ?C  ?SpO2: 97% 97%  ?  ?Last Pain:  ?Vitals:  ? 11/27/21 1107  ?TempSrc:   ?PainSc: 0-No pain  ? ? ?  ?  ?  ?  ?  ?  ? ?Corinda Gubler ? ? ? ? ?

## 2021-11-27 NOTE — H&P (Signed)
?Outpatient short stay form Pre-procedure ?11/27/2021 11:58 AM ?Lorie Apley K. Alice Reichert, M.D. ? ?Primary Physician: Frazier Richards, M.D. ? ?Reason for visit:  Dysphagia, RUQ pain ? ?History of present illness: 56 year old male with chronic right upper quadrant pain and Parkinson's syndrome presents for intermittent pharyngeal dysphagia without symptoms of esophageal blockages.  He underwent modified barium swallow couple of months ago that was negative for any aspiration or oropharyngeal pathology. ? ? ? ?Current Facility-Administered Medications:  ?  0.9 %  sodium chloride infusion, , Intravenous, Continuous, Joseph, Benay Pike, MD, Last Rate: 20 mL/hr at 11/27/21 0945, Restarted at 11/27/21 1033 ? ?Current Outpatient Medications:  ?  carbidopa-levodopa (SINEMET IR) 25-100 MG tablet, Take 1 tablet by mouth 3 (three) times daily., Disp: , Rfl:  ?  Daridorexant HCl (QUVIVIQ) 50 MG TABS, Take 1 tablet by mouth at bedtime., Disp: 30 tablet, Rfl: 1 ?  pramipexole (MIRAPEX) 0.25 MG tablet, TAKE 1 TABLET BY MOUTH TWICE DAILY, Disp: 180 tablet, Rfl: 1 ?  rosuvastatin (CRESTOR) 20 MG tablet, Take 20 mg by mouth daily., Disp: , Rfl:  ?  sertraline (ZOLOFT) 100 MG tablet, Take 2 tablets (200 mg total) by mouth daily., Disp: 180 tablet, Rfl: 1 ?  traZODone (DESYREL) 100 MG tablet, Take 2 tablets (200 mg total) by mouth at bedtime as needed for sleep., Disp: 90 tablet, Rfl: 0 ?  dicyclomine (BENTYL) 20 MG tablet, Take 1 tablet (20 mg total) by mouth 4 (four) times daily -  before meals and at bedtime for 10 days., Disp: 40 tablet, Rfl: 0 ?  HYDROcodone-acetaminophen (NORCO/VICODIN) 5-325 MG tablet, Take 1-2 tablets by mouth every 6 (six) hours as needed for moderate pain or severe pain (no more than 6 tabs daily)., Disp: 12 tablet, Rfl: 0 ? ?Facility-Administered Medications Ordered in Other Encounters:  ?  glycopyrrolate (ROBINUL) injection, , Intravenous, Anesthesia Intra-op, Fletcher-Harrison, Tawana, CRNA, 0.2 mg at 11/27/21  1022 ?  lidocaine (cardiac) 100 mg/76mL (XYLOCAINE) injection 2%, , Intravenous, Anesthesia Intra-op, Fletcher-Harrison, Tawana, CRNA, 100 mg at 11/27/21 1036 ?  propofol (DIPRIVAN) 10 mg/mL bolus/IV push, , Intravenous, Anesthesia Intra-op, Fletcher-Harrison, Tawana, CRNA, 10 mg at 11/27/21 1038 ?  propofol (DIPRIVAN) 500 MG/50ML infusion, , Intravenous, Continuous PRN, Fletcher-Harrison, Tawana, CRNA, Stopped at 11/27/21 1044 ? ?No medications prior to admission.  ? ? ? ?Allergies  ?Allergen Reactions  ? Adhesive [Tape] Other (See Comments)  ?  Blisters ?  ? Codeine Nausea And Vomiting  ? Latex Other (See Comments)  ?  blisters  ? ? ? ?Past Medical History:  ?Diagnosis Date  ? Anxiety   ? Anxiety   ? Arthritis   ? Depression   ? Hyperlipidemia   ? Insomnia   ? Lymphadenopathy, retroperitoneal   ? OSA (obstructive sleep apnea)   ? Parkinson disease ([place])   ? Sciatica   ? Seizures ([place])   ? Spinal stenosis   ? Spinal stenosis   ? Spondylisthesis   ? ? ?Review of systems:  Otherwise negative.  ? ? ?Physical Exam ? ?Gen: Alert, oriented. Appears stated age.  ?HEENT: [place]/AT. PERRLA. ?Lungs: CTA, no wheezes. ?CV: RR nl S1, S2. ?Abd: soft, benign, no masses. BS+ ?Ext: No edema. Pulses 2+ ? ? ? ?Planned procedures: Proceed with EGD.  The patient understands the nature of the planned procedure, indications, risks, alternatives and potential complications including but not limited to bleeding, infection, perforation, damage to internal organs and possible oversedation/side effects from anesthesia. The patient agrees and gives consent to proceed.  ?  Please refer to procedure notes for findings, recommendations and patient disposition/instructions.  ? ? ? ?Keirah Konitzer K. Alice Reichert, M.D. ?Gastroenterology ?11/27/2021  11:58 AM ? ? ? ? ? ?

## 2021-11-27 NOTE — Interval H&P Note (Signed)
History and Physical Interval Note: ? ?11/27/2021 ?11:59 AM ? ?Dakota Davis  has presented today for surgery, with the diagnosis of Dysphagia, pharyngeal (R13.13).  The various methods of treatment have been discussed with the patient and family. After consideration of risks, benefits and other options for treatment, the patient has consented to  Procedure(s): ?ESOPHAGOGASTRODUODENOSCOPY (EGD) (N/A) as a surgical intervention.  The patient's history has been reviewed, patient examined, no change in status, stable for surgery.  I have reviewed the patient's chart and labs.  Questions were answered to the patient's satisfaction.   ? ? ?[place], [place] ? ? ?

## 2021-11-27 NOTE — Telephone Encounter (Signed)
Dakota Davis, let him know that Dakota Davis was not covered by his insurance.  Perhaps Dakota Davis will be and so I sent in a prescription for that one which is very similar. ?

## 2021-11-27 NOTE — Anesthesia Preprocedure Evaluation (Signed)
Anesthesia Evaluation  ?Patient identified by MRN, date of birth, ID band ?Patient awake ? ? ? ?Reviewed: ?Allergy & Precautions, NPO status , Patient's Chart, lab work & pertinent test results ? ?History of Anesthesia Complications ?Negative for: history of anesthetic complications ? ?Airway ?Mallampati: III ? ?TM Distance: >3 FB ?Neck ROM: Full ? ? ? Dental ?no notable dental hx. ?(+) Teeth Intact ?  ?Pulmonary ?sleep apnea and Continuous Positive Airway Pressure Ventilation , neg COPD, Patient abstained from smoking.Not current smoker,  ?  ?Pulmonary exam normal ?breath sounds clear to auscultation ? ? ? ? ? ? Cardiovascular ?Exercise Tolerance: Good ?METS(-) hypertension+ angina (-) CAD and (-) Past MI (-) dysrhythmias  ?Rhythm:Regular Rate:Normal ?- Systolic murmurs ?Cath 2018: ?The patient has had canadian class 3 angina with high risk stress test and vtach  and risk factors which include high blood pressure and high cholesterol. ?Left ventricular function is normal with ejection fraction of 60%. ?Normal coronary arteries with 0% stenosis ? ?  ?Neuro/Psych ?PSYCHIATRIC DISORDERS Anxiety Depression Hx functional seizures ?  ? GI/Hepatic ?neg GERD  ,(+)  ?  ? (-) substance abuse ? ,   ?Endo/Other  ?neg diabetes ? Renal/GU ?negative Renal ROS  ? ?  ?Musculoskeletal ? ? Abdominal ?  ?Peds ? Hematology ?  ?Anesthesia Other Findings ?Past Medical History: ?No date: Anxiety ?No date: Anxiety ?No date: Arthritis ?No date: Depression ?No date: Hyperlipidemia ?No date: Insomnia ?No date: Lymphadenopathy, retroperitoneal ?No date: OSA (obstructive sleep apnea) ?No date: Parkinson disease [facility]) ?No date: Sciatica ?No date: Seizures (HCC) ?No date: Spinal stenosis ?No date: Spinal stenosis ?No date: Spondylisthesis ? Reproductive/Obstetrics ? ?  ? ? ? ? ? ? ? ? ? ? ? ? ? ?  ?  ? ? ? ? ? ? ? ? ?Anesthesia Physical ?Anesthesia Plan ? ?ASA: 3 ? ?Anesthesia Plan: General  ? ?Post-op Pain  Management: Minimal or no pain anticipated  ? ?Induction: Intravenous ? ?PONV Risk Score and Plan: 2 and Propofol infusion, TIVA and Ondansetron ? ?Airway Management Planned: Nasal Cannula ? ?Additional Equipment: None ? ?Intra-op Plan:  ? ?Post-operative Plan:  ? ?Informed Consent: I have reviewed the patients History and Physical, chart, labs and discussed the procedure including the risks, benefits and alternatives for the proposed anesthesia with the patient or authorized representative who has indicated his/her understanding and acceptance.  ? ? ? ?Dental advisory given ? ?Plan Discussed with: CRNA and Surgeon ? ?Anesthesia Plan Comments: (Discussed risks of anesthesia with patient, including possibility of difficulty with spontaneous ventilation under anesthesia necessitating airway intervention, PONV, and rare risks such as cardiac or respiratory or neurological events, and allergic reactions. Discussed the role of CRNA in patient's perioperative care. Patient understands.)  ? ? ? ? ? ? ?Anesthesia Quick Evaluation ? ?

## 2021-11-27 NOTE — Telephone Encounter (Signed)
Pt informed

## 2021-11-27 NOTE — Anesthesia Procedure Notes (Signed)
Date/Time: 11/27/2021 10:44 AM ?Performed by: Kelton Pillar, CRNA ?Pre-anesthesia Checklist: Patient identified, Emergency Drugs available, Suction available and Patient being monitored ?Patient Re-evaluated:Patient Re-evaluated prior to induction ?Oxygen Delivery Method: Simple face mask ?Induction Type: IV induction ?Placement Confirmation: positive ETCO2 and CO2 detector ?Dental Injury: Teeth and Oropharynx as per pre-operative assessment  ? ? ? ? ?

## 2021-11-27 NOTE — Op Note (Signed)
[facility] ?Gastroenterology ?Patient Name: Dakota Davis ?Procedure Date: 11/27/2021 10:29 AM ?MRN: [id number] ?Account #: [id number] ?Date of Birth: February 19, 1966 ?Admit Type: Outpatient ?Age: 56 ?Room: [facility] ENDO ROOM 2 ?Gender: Male ?Note Status: Finalized ?Instrument Name: Upper Endoscope [id number] ?Procedure:             Upper GI endoscopy ?Indications:           Pharyngeal phase dysphagia, Suspected esophageal reflux ?Providers:             Benay Pike. Alice Reichert MD, MD ?Referring MD:          Ocie Cornfield. Ouida Sills MD, MD (Referring MD) ?Medicines:             Propofol per Anesthesia ?Complications:         No immediate complications. ?Procedure:             Pre-Anesthesia Assessment: ?                       - The risks and benefits of the procedure and the  ?                       sedation options and risks were discussed with the  ?                       patient. All questions were answered and informed  ?                       consent was obtained. ?                       - Patient identification and proposed procedure were  ?                       verified prior to the procedure by the nurse. The  ?                       procedure was verified in the procedure room. ?                       - ASA Grade Assessment: III - A patient with severe  ?                       systemic disease. ?                       - ASA Grade Assessment: III - A patient with severe  ?                       systemic disease. ?                       - After reviewing the risks and benefits, the patient  ?                       was deemed in satisfactory condition to undergo the  ?                       procedure. ?  After obtaining informed consent, the endoscope was  ?                       passed under direct vision. Throughout the procedure,  ?                       the patient's blood pressure, pulse, and oxygen  ?                       saturations were monitored continuously. The Endoscope  ?                        was introduced through the mouth, and advanced to the  ?                       third part of duodenum. The upper GI endoscopy was  ?                       accomplished without difficulty. The patient tolerated  ?                       the procedure well. ?Findings: ?     No endoscopic abnormality was evident in the esophagus to explain the  ?     patient's complaint of dysphagia. It was decided, however, to proceed  ?     with dilation in the distal esophagus. The scope was withdrawn. Dilation  ?     was performed with a Maloney dilator with no resistance at 34 Fr. ?     Localized minimal inflammation characterized by erythema was found in  ?     the gastric antrum. Biopsies were taken with a cold forceps for  ?     Helicobacter pylori testing. ?     The cardia and gastric fundus were normal on retroflexion. ?     The examined duodenum was normal. ?     The exam was otherwise without abnormality. ?Impression:            - No endoscopic esophageal abnormality to explain  ?                       patient's dysphagia. Esophagus dilated. Dilated. ?                       - Gastritis. Biopsied. ?                       - Normal examined duodenum. ?                       - The examination was otherwise normal. ?Recommendation:        - Patient has a contact number available for  ?                       emergencies. The signs and symptoms of potential  ?                       delayed complications were discussed with the patient.  ?                       Return to  normal activities tomorrow. Written  ?                       discharge instructions were provided to the patient. ?                       - Resume previous diet. ?                       - Continue present medications. ?                       - Await pathology results. ?                       - Return to my office in 3 months. ?                       - The findings and recommendations were discussed with  ?                       the  patient. ?Procedure Code(s):     --- Professional --- ?                       [id number], Esophagogastroduodenoscopy, flexible,  ?                       transoral; with biopsy, single or multiple ?                       43450, Dilation of esophagus, by unguided sound or  ?                       bougie, single or multiple passes ?Diagnosis Code(s):     --- Professional --- ?                       R13.13, Dysphagia, pharyngeal phase ?                       K29.70, Gastritis, unspecified, without bleeding ?CPT copyright 2019 American Medical Association. All rights reserved. ?The codes documented in this report are preliminary and upon coder review may  ?be revised to meet current compliance requirements. ?Efrain Sella MD, MD ?11/27/2021 10:48:14 AM ?This report has been signed electronically. ?Number of Addenda: 0 ?Note Initiated On: 11/27/2021 10:29 AM ?Estimated Blood Loss:  Estimated blood loss: none. ?     [facility] ?

## 2021-11-27 NOTE — Transfer of Care (Signed)
Immediate Anesthesia Transfer of Care Note ? ?Patient: Dakota Davis ? ?Procedure(s) Performed: ESOPHAGOGASTRODUODENOSCOPY (EGD) ? ?Patient Location: Endoscopy Unit ? ?Anesthesia Type:General ? ?Level of Consciousness: drowsy and patient cooperative ? ?Airway & Oxygen Therapy: Patient Spontanous Breathing and Patient connected to face mask oxygen ? ?Post-op Assessment: Report given to RN and Post -op Vital signs reviewed and stable ? ?Post vital signs: Reviewed and stable ? ?Last Vitals:  ?Vitals Value Taken Time  ?BP 119/77 11/27/21 1048  ?Temp 36.6 ?C 11/27/21 1047  ?Pulse 72 11/27/21 1049  ?Resp 15 11/27/21 1049  ?SpO2 98 % 11/27/21 1049  ?Vitals shown include unvalidated device data. ? ?Last Pain:  ?Vitals:  ? 11/27/21 1047  ?TempSrc: Temporal  ?PainSc: Asleep  ?   ? ?  ? ?Complications: No notable events documented. ?

## 2021-11-28 LAB — SURGICAL PATHOLOGY

## 2021-11-28 NOTE — Telephone Encounter (Signed)
New PA submitted for DAYVIGO 10 MG with [employer] ?

## 2021-11-29 ENCOUNTER — Encounter: Payer: Self-pay | Admitting: Internal Medicine

## 2021-11-29 NOTE — Telephone Encounter (Signed)
Approval received for DAYVIGO 10 mg effective 11/28/2021-11/27/2024 with [facility] ?

## 2022-02-11 ENCOUNTER — Encounter: Payer: Self-pay | Admitting: Psychiatry

## 2022-02-11 ENCOUNTER — Ambulatory Visit: Payer: BC Managed Care – PPO | Admitting: Psychiatry

## 2022-02-11 DIAGNOSIS — F3341 Major depressive disorder, recurrent, in partial remission: Secondary | ICD-10-CM | POA: Diagnosis not present

## 2022-02-11 DIAGNOSIS — F5105 Insomnia due to other mental disorder: Secondary | ICD-10-CM | POA: Diagnosis not present

## 2022-02-11 DIAGNOSIS — G20 Parkinson's disease: Secondary | ICD-10-CM

## 2022-02-11 DIAGNOSIS — F411 Generalized anxiety disorder: Secondary | ICD-10-CM | POA: Diagnosis not present

## 2022-02-11 DIAGNOSIS — F4310 Post-traumatic stress disorder, unspecified: Secondary | ICD-10-CM | POA: Diagnosis not present

## 2022-02-11 DIAGNOSIS — G20C Parkinsonism, unspecified: Secondary | ICD-10-CM

## 2022-02-11 MED ORDER — TEMAZEPAM 15 MG PO CAPS
15.0000 mg | ORAL_CAPSULE | Freq: Every evening | ORAL | 0 refills | Status: DC | PRN
Start: 2022-02-11 — End: 2022-04-14

## 2022-02-11 NOTE — Progress Notes (Signed)
Dakota Davis [id number] December 09, 1965 56 y.o.  Subjective:   Patient ID:  Dakota Davis is a 56 y.o. (DOB 1965-12-13) male.  Chief Complaint:  Chief Complaint  Patient presents with   Follow-up   Depression   Anxiety   Sleeping Problem    Depression        Past medical history includes anxiety.   Anxiety Symptoms include nervous/anxious behavior. Patient reports no dizziness or palpitations.    Dakota Davis presents to the office today for follow-up of generalized anxiety disorder with elements of PTSD and major depression.  seen October 2020.  No meds were changed.  He remained on sertraline 200, aripiprazole 10 mg and Sonata or trazodone as needed insomnia.  January 11, 2020 appointment the following is noted: Had Covid Feb with flu-like sx and fatigue lasted several days. At times feels he's slipping into a bit more depression with less motivation and isolating.  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  Patient reports stable mood and denies depressed or irritable moods.  Patient with residual anxiety which sometimes interferes with sleep.  Sonata helps prn.  2-3 days a week can be hard to shut off brain.  Occ EFA.Marland Kitchen Usually 7-8 hour of sleep.  OCc daytime tiredness. Denies appetite disturbance.  Patient reports that energy and motivation have been good.  Patient denies any difficulty with concentration.  Patient denies any suicidal ideation. Plan:  He wanted to try Rexulti 2 mg augmentation  05/14/20 appt with the following noted: Rexulti for 2 mos helped but cost $250/mo copay and couldn't afford.  Felt worse off of it. Anxiety out the roof with uncertain cause. Seems worse than ever.  Varying subjects for worry.  Can't seem to get a hold of it.  Wife notices he's distant for weeks. Rexulti helped significantly.   Plan: sampled Rexulti  07/11/2020 appointment with the following noted: Been able to stay on Rexulti and is  better on it.  Clear difference.  Anxiety is better and manageable.  Mild depression comes and goes and manageable. No SE. Sleep not great with trouble staying asleep.  EFA with variable times.   Plan: Clearly better with Rexulti 2 mg in addition to sertraline 200 mg daily. Recommend a trial of trazodone at 100 250 mg nightly for insomnia  09/21/2020 phone call from patient stating he could no longer afford Rexulti.  He had already failed Abilify.  He is likely to be unable to afford the other partial dopamine agonist Vraylar.  Therefore initiated off label trial of pramipexole to take the place of the Rexulti.  Patient has a pending appointment in 3 weeks.  10/11/2020 appointment with the following noted: Ran out of Rexulti bc cost with new year was going to be $1000.   Overall still doing fine and has been since last seen.  Not noticed any changes so far with switch from Rexulti to pramipexole. Sleep is better.  Awakens but back to sleep.  Depression and anxiety are manageable. Patient reports stable mood and denies depressed or irritable moods.  Patient denies any recent difficulty with anxiety.  Patient denies difficulty with sleep initiation or maintenance. Denies appetite disturbance.  Patient reports that energy and motivation have been good.  Patient denies any difficulty with concentration.  Patient denies any suicidal ideation. No SE Plan: Clearly better with Rexulti 2 mg in addition to sertraline 200 mg daily. So far has been OK with 3 week change to pramipexole  but he could still relaps bc of long half life of Rexulti. Switch for sleep to trazodone.  01/07/21 appt noted: Ok overall.  Noticed some things he just wrote off like finger twitching and now resting tremor on R hand and when does something it goes away.  Went to PCP and awaiting referral to neurologist.  Also had some balance issues for some months also and wonders about PD.   Depression is OK.  Caffeine as late as bedtime.  Coke  Zero and diet Mtn Dew. Plan: No med changes  03/19/2021 appointment with the following noted: Reviewed neuro note 02/21/2021 Dr. Sherryll Burger who suspects Parkinson's disease and started carbidopa levodopa. Maybe less stiffness with Sinemet. Pretty good re: mental health since here.  Trazodone helps some with sleep. No SE Plan: Continue pramipexole 0.25 mg BID Sertraline 200 daily trazodone 100 -200 mg HS helps go asleep but doesn't stay asleep..   Switch to decaf by 3 PM.  Emphasized this  07/22/21 appt noted: Still pretty well.  Depression and anxiety pretty under control. Continue meds as above. No SE CO EFA/EMA. Awakens after 3 hours. Sometimes back to sleep.  Has OSA and on CPAP. Usually works daytime so little napping. Plan: Continue pramipexole 0.25 mg BID Sertraline 200 daily Switch trazodone bc doesn't stay asleep to hydroxyzine 25-50 mg HS.Marland Kitchen  Also gave RX Belsomra samples in case it failed. 10-20 Switch to decaf by 3 PM.  Emphasized this  He's done this  11/12/21 appt noted: Hydroxyzine works but hangover  and lingers through the day with 25 mg. Belsomra also hangover but did keep him asleep better. Sleep same ith decent effect initially with trazodone.  Sleep is not as good as needed and some irritability.   Mood pretty fair without major problems. Patient reports stable mood and denies depressed or irritable moods.  Patient denies any recent difficulty with anxiety other than mild background levels.  His wife and her family are close and create stress.   Denies appetite disturbance.  Patient reports that energy and motivation have been good.  Patient denies any difficulty with concentration.  Patient denies any suicidal ideation. Plan: Continue pramipexole 0.25 mg BID Sertraline 200 daily Switch trazodone bc doesn't stay asleep to hydroxyzine 25-50 mg HS..  Trial Quiviviq 50 mg HS Switch to decaf by 3 PM.  Emphasized this  He's done this  02/11/2022 appointment with the following  noted: Can't get anything to help stay asleep.  Trazodone best so far bc without hangover. Not sure how much sleep.  Varies.  Last week 3 AM wide awake. Anxiety is higher than it was and more irritable without trigger.  Hard to shut off mind at night.   Past Psychiatric Medication Trials: Sertraline 200, venlafaxine 225, paroxetine Abilify 10, Rexulti 2,  Pramipexole  buspirone 30 twice daily, doxazosin 8,  Sonata ? effect, trazodone 200 EMA, hydroxyzine 25 helped a little. hangover, Belsomra hangover, Quvivik NR & hangover, Davigo NR  Review of Systems:  Review of Systems  Cardiovascular:  Negative for palpitations.  Gastrointestinal:  Positive for abdominal pain.  Genitourinary:  Positive for frequency.  Neurological:  Positive for tremors. Negative for dizziness and weakness.       Balance and some stiffness  Psychiatric/Behavioral:  Positive for sleep disturbance. The patient is nervous/anxious.    Medications: I have reviewed the patient's current medications.  Current Outpatient Medications  Medication Sig Dispense Refill   carbidopa-levodopa (SINEMET IR) 25-100 MG tablet Take 1 tablet by mouth  3 (three) times daily.     pramipexole (MIRAPEX) 0.25 MG tablet TAKE 1 TABLET BY MOUTH TWICE DAILY 180 tablet 1   rosuvastatin (CRESTOR) 20 MG tablet Take 20 mg by mouth daily.     sertraline (ZOLOFT) 100 MG tablet Take 2 tablets (200 mg total) by mouth daily. 180 tablet 1   dicyclomine (BENTYL) 20 MG tablet Take 1 tablet (20 mg total) by mouth 4 (four) times daily -  before meals and at bedtime for 10 days. 40 tablet 0   temazepam (RESTORIL) 15 MG capsule Take 1-2 capsules (15-30 mg total) by mouth at bedtime as needed for sleep. 30 capsule 0   traZODone (DESYREL) 100 MG tablet Take 2 tablets (200 mg total) by mouth at bedtime as needed for sleep. (Patient not taking: Reported on 02/11/2022) 90 tablet 0   No current facility-administered medications for this visit.    Medication Side  Effects: None  Allergies:  Allergies  Allergen Reactions   Adhesive [Tape] Other (See Comments)    Blisters    Codeine Nausea And Vomiting   Latex Other (See Comments)    blisters    Past Medical History:  Diagnosis Date   Anxiety    Anxiety    Arthritis    Depression    Hyperlipidemia    Insomnia    Lymphadenopathy, retroperitoneal    OSA (obstructive sleep apnea)    Parkinson disease (HCC)    Sciatica    Seizures (HCC)    Spinal stenosis    Spinal stenosis    Spondylisthesis     History reviewed. No pertinent family history.  Social History   Socioeconomic History   Marital status: Married    Spouse name: Not on file   Number of children: Not on file   Years of education: Not on file   Highest education level: Not on file  Occupational History   Not on file  Tobacco Use   Smoking status: Never   Smokeless tobacco: Never  Vaping Use   Vaping Use: Never used  Substance and Sexual Activity   Alcohol use: Yes    Alcohol/week: 2.0 standard drinks    Types: 2 Cans of beer per week    Comment: NONE LAST 24 HRS   Drug use: No   Sexual activity: Yes  Other Topics Concern   Not on file  Social History Narrative   Not on file   Social Determinants of Health   Financial Resource Strain: Not on file  Food Insecurity: Not on file  Transportation Needs: Not on file  Physical Activity: Not on file  Stress: Not on file  Social Connections: Not on file  Intimate Partner Violence: Not on file    Past Medical History, Surgical history, Social history, and Family history were reviewed and updated as appropriate.   Please see review of systems for further details on the patient's review from today.   Objective:   Physical Exam:  There were no vitals taken for this visit.  Physical Exam Constitutional:      General: He is not in acute distress.    Appearance: He is well-developed. He is obese.  Musculoskeletal:        General: No deformity.   Neurological:     Mental Status: He is alert and oriented to person, place, and time.     Coordination: Coordination normal.  Psychiatric:        Attention and Perception: Attention and perception normal. He does not perceive  auditory or visual hallucinations.        Mood and Affect: Mood is anxious. Mood is not depressed. Affect is not labile, blunt, angry, tearful or inappropriate.        Speech: Speech normal.        Behavior: Behavior normal.        Thought Content: Thought content normal. Thought content is not delusional. Thought content does not include homicidal or suicidal ideation. Thought content does not include suicidal plan.        Cognition and Memory: Cognition and memory normal.        Judgment: Judgment normal.     Comments: Insight intact. No delusions.  More irritable without reason    Lab Review:     Component Value Date/Time   NA 137 10/23/2021 1340   K 3.7 10/23/2021 1340   CL 102 10/23/2021 1340   CO2 27 10/23/2021 1340   GLUCOSE 144 (H) 10/23/2021 1340   BUN 12 10/23/2021 1340   CREATININE 1.22 10/23/2021 1340   CALCIUM 9.4 10/23/2021 1340   PROT 7.7 10/23/2021 1340   ALBUMIN 4.5 10/23/2021 1340   AST 23 10/23/2021 1340   ALT 6 10/23/2021 1340   ALKPHOS 57 10/23/2021 1340   BILITOT 0.8 10/23/2021 1340   GFRNONAA >60 10/23/2021 1340       Component Value Date/Time   WBC 7.9 10/23/2021 1340   RBC 4.71 10/23/2021 1340   HGB 14.5 10/23/2021 1340   HCT 42.6 10/23/2021 1340   PLT 198 10/23/2021 1340   MCV 90.4 10/23/2021 1340   MCH 30.8 10/23/2021 1340   MCHC 34.0 10/23/2021 1340   RDW 12.7 10/23/2021 1340   LYMPHSABS 2.7 02/14/2021 1510   MONOABS 0.8 02/14/2021 1510   EOSABS 0.4 02/14/2021 1510   BASOSABS 0.1 02/14/2021 1510    No results found for: POCLITH, LITHIUM   No results found for: PHENYTOIN, PHENOBARB, VALPROATE, CBMZ   .res Assessment: Plan:    Chrissie NoaWilliam was seen today for follow-up, depression, anxiety and sleeping  problem.  Diagnoses and all orders for this visit:  Depression, major, recurrent, in partial remission (HCC)  Generalized anxiety disorder  PTSD (post-traumatic stress disorder)  Insomnia due to mental condition -     temazepam (RESTORIL) 15 MG capsule; Take 1-2 capsules (15-30 mg total) by mouth at bedtime as needed for sleep.  Primary parkinsonism (HCC)   Greater than 50% of 30 min face to face time with patient was spent on counseling and coordination of care. We discussed  ongoing sleep problems  triggering irritability.    Discussed potential metabolic side effects associated with atypical antipsychotics, as well as potential risk for movement side effects. Advised pt to contact office if movement side effects occur.  responded to Rexulti 2 mg with resolution of sx. Had to switch to pramipexole for cost reasons. Still doing oK.  Rexulti worked better.  We discussed the short-term risks associated with benzodiazepines including sedation and increased fall risk among others.  Discussed long-term side effect risk including dependence, potential withdrawal symptoms, and the potential eventual dose-related risk of dementia.  But recent studies from 2020 dispute this association between benzodiazepines and dementia risk. Newer studies in 2020 do not support an association with dementia. Disc Bz risk with sleep bc anxiety with it and doesn't feel well and on max SSRI.  Disc pros and cons given failure of non-BZ for sleep.   Is also more irritable. Yes trial Restoril 15 mg 1-2 HS for EMA  Good to have switched DT recent dx PD.  Disc pramipexole in reference to dx PD.  Dr. Sherryll Burger ok with it's low dose added on for off-label treatment of depression. Continue pramipexole 0.25 mg BID Sertraline 200 daily  Switch to decaf by 3 PM.  Emphasized this  He's done this   Disc SE in detail and SSRI withdrawal sx.  Supportive therapy dealing with   FU 2 mos.  Meredith Staggers, MD, DFAPA    Please  see After Visit Summary for patient specific instructions.  No future appointments.   No orders of the defined types were placed in this encounter.    -------------------------------

## 2022-04-14 ENCOUNTER — Ambulatory Visit: Payer: BC Managed Care – PPO | Admitting: Psychiatry

## 2022-04-14 ENCOUNTER — Encounter: Payer: Self-pay | Admitting: Psychiatry

## 2022-04-14 DIAGNOSIS — F411 Generalized anxiety disorder: Secondary | ICD-10-CM | POA: Diagnosis not present

## 2022-04-14 DIAGNOSIS — F4310 Post-traumatic stress disorder, unspecified: Secondary | ICD-10-CM | POA: Diagnosis not present

## 2022-04-14 DIAGNOSIS — F3341 Major depressive disorder, recurrent, in partial remission: Secondary | ICD-10-CM | POA: Diagnosis not present

## 2022-04-14 DIAGNOSIS — F5105 Insomnia due to other mental disorder: Secondary | ICD-10-CM

## 2022-04-14 MED ORDER — CLONAZEPAM 0.5 MG PO TABS: ORAL_TABLET | ORAL | 1 refills | Status: DC

## 2022-04-14 NOTE — Progress Notes (Signed)
Dakota PoserWilliam Luckman Davis [id number] [birth year] 56 y.o.  Subjective:   Patient ID:  Dakota Davis is a 56 y.o. (DOB [birth year]) male.  Chief Complaint:  Chief Complaint  Patient presents with   Follow-up   Depression   Anxiety   Post-Traumatic Stress Disorder   Sleeping Problem    Depression        Past medical history includes anxiety.   Anxiety Symptoms include nervous/anxious behavior. Patient reports no dizziness or palpitations.     Dakota PoserWilliam Gehl Davis presents to the office today for follow-up of generalized anxiety disorder with elements of PTSD and major depression.  seen October 2020.  No meds were changed.  He remained on sertraline 200, aripiprazole 10 mg and Sonata or trazodone as needed insomnia.  January 11, 2020 appointment the following is noted: Had Covid Feb with flu-like sx and fatigue lasted several days. At times feels he's slipping into a bit more depression with less motivation and isolating.  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  Patient reports stable mood and denies depressed or irritable moods.  Patient with residual anxiety which sometimes interferes with sleep.  Sonata helps prn.  2-3 days a week can be hard to shut off brain.  Occ EFA.Marland Kitchen. Usually 7-8 hour of sleep.  OCc daytime tiredness. Denies appetite disturbance.  Patient reports that energy and motivation have been good.  Patient denies any difficulty with concentration.  Patient denies any suicidal ideation. Plan:  He wanted to try Rexulti 2 mg augmentation  05/14/20 appt with the following noted: Rexulti for 2 mos helped but cost $250/mo copay and couldn't afford.  Felt worse off of it. Anxiety out the roof with uncertain cause. Seems worse than ever.  Varying subjects for worry.  Can't seem to get a hold of it.  Wife notices he's distant for weeks. Rexulti helped significantly.   Plan: sampled Rexulti  07/11/2020 appointment with the following noted: Been  able to stay on Rexulti and is better on it.  Clear difference.  Anxiety is better and manageable.  Mild depression comes and goes and manageable. No SE. Sleep not great with trouble staying asleep.  EFA with variable times.   Plan: Clearly better with Rexulti 2 mg in addition to sertraline 200 mg daily. Recommend a trial of trazodone at 100 250 mg nightly for insomnia  09/21/2020 phone call from patient stating he could no longer afford Rexulti.  He had already failed Abilify.  He is likely to be unable to afford the other partial dopamine agonist Vraylar.  Therefore initiated off label trial of pramipexole to take the place of the Rexulti.  Patient has a pending appointment in 3 weeks.  10/11/2020 appointment with the following noted: Ran out of Rexulti bc cost with new year was going to be $1000.   Overall still doing fine and has been since last seen.  Not noticed any changes so far with switch from Rexulti to pramipexole. Sleep is better.  Awakens but back to sleep.  Depression and anxiety are manageable. Patient reports stable mood and denies depressed or irritable moods.  Patient denies any recent difficulty with anxiety.  Patient denies difficulty with sleep initiation or maintenance. Denies appetite disturbance.  Patient reports that energy and motivation have been good.  Patient denies any difficulty with concentration.  Patient denies any suicidal ideation. No SE Plan: Clearly better with Rexulti 2 mg in addition to sertraline 200 mg daily. So far has been OK  with 3 week change to pramipexole but he could still relaps bc of long half life of Rexulti. Switch for sleep to trazodone.  01/07/21 appt noted: Ok overall.  Noticed some things he just wrote off like finger twitching and now resting tremor on R hand and when does something it goes away.  Went to PCP and awaiting referral to neurologist.  Also had some balance issues for some months also and wonders about PD.   Depression is OK.   Caffeine as late as bedtime.  Coke Zero and diet Mtn Dew. Plan: No med changes  03/19/2021 appointment with the following noted: Reviewed neuro note 02/21/2021 Dr. Sherryll Burger who suspects Parkinson's disease and started carbidopa levodopa. Maybe less stiffness with Sinemet. Pretty good re: mental health since here.  Trazodone helps some with sleep. No SE Plan: Continue pramipexole 0.25 mg BID Sertraline 200 daily trazodone 100 -200 mg HS helps go asleep but doesn't stay asleep..   Switch to decaf by 3 PM.  Emphasized this  07/22/21 appt noted: Still pretty well.  Depression and anxiety pretty under control. Continue meds as above. No SE CO EFA/EMA. Awakens after 3 hours. Sometimes back to sleep.  Has OSA and on CPAP. Usually works daytime so little napping. Plan: Continue pramipexole 0.25 mg BID Sertraline 200 daily Switch trazodone bc doesn't stay asleep to hydroxyzine 25-50 mg HS.Marland Kitchen  Also gave RX Belsomra samples in case it failed. 10-20 Switch to decaf by 3 PM.  Emphasized this  He's done this  11/12/21 appt noted: Hydroxyzine works but hangover  and lingers through the day with 25 mg. Belsomra also hangover but did keep him asleep better. Sleep same ith decent effect initially with trazodone.  Sleep is not as good as needed and some irritability.   Mood pretty fair without major problems. Patient reports stable mood and denies depressed or irritable moods.  Patient denies any recent difficulty with anxiety other than mild background levels.  His wife and her family are close and create stress.   Denies appetite disturbance.  Patient reports that energy and motivation have been good.  Patient denies any difficulty with concentration.  Patient denies any suicidal ideation. Plan: Continue pramipexole 0.25 mg BID Sertraline 200 daily Switch trazodone bc doesn't stay asleep to hydroxyzine 25-50 mg HS..  Trial Quiviviq 50 mg HS Switch to decaf by 3 PM.  Emphasized this  He's done  this  02/11/2022 appointment with the following noted: Can't get anything to help stay asleep.  Trazodone best so far bc without hangover. Not sure how much sleep.  Varies.  Last week 3 AM wide awake. Anxiety is higher than it was and more irritable without trigger.  Hard to shut off mind at night. Plan: Yes trial Restoril 15 mg 1-2 HS for EMA  04/14/22 appt noted: On temazepam  30 mg HS and off trazodone. On sertraline 200 mg  and pramipexole 0.25 mg BID and Sinemet. Still sleep px avg 3 hours.  Sleep terrible.  Trouble getting back to sleep. Back pain will awaken him and goes to LR and into chair.  Not always the cause.  No pain meds. Work up pending with hx fusion.  Also has nausea. 25th wedding anniversary and to [place] this week. Son 3 strokes as child from heart px as a kid.  Past Psychiatric Medication Trials: Sertraline 200, venlafaxine 225, paroxetine Abilify 10, Rexulti 2,  Pramipexole  buspirone 30 twice daily, doxazosin 8,  Sonata ? effect, trazodone 200 EMA,  hydroxyzine 25 helped a little. hangover, Belsomra hangover, Quvivik NR & hangover, Davigo NR, temazepam 30 NR  Review of Systems:  Review of Systems  Cardiovascular:  Negative for palpitations.  Gastrointestinal:  Positive for abdominal pain.  Genitourinary:  Positive for frequency.  Musculoskeletal:  Positive for back pain.  Neurological:  Positive for tremors. Negative for dizziness and weakness.       Balance and some stiffness  Psychiatric/Behavioral:  Positive for sleep disturbance. The patient is nervous/anxious.     Medications: I have reviewed the patient's current medications.  Current Outpatient Medications  Medication Sig Dispense Refill   carbidopa-levodopa (SINEMET IR) 25-100 MG tablet Take 1 tablet by mouth 3 (three) times daily.     clonazePAM (KLONOPIN) 0.5 MG tablet Take 1/2 tab po BID prn anxiety 90 tablet 1   pramipexole (MIRAPEX) 0.25 MG tablet TAKE 1 TABLET BY MOUTH TWICE DAILY  180 tablet 1   rosuvastatin (CRESTOR) 20 MG tablet Take 20 mg by mouth daily.     sertraline (ZOLOFT) 100 MG tablet Take 2 tablets (200 mg total) by mouth daily. 180 tablet 1   dicyclomine (BENTYL) 20 MG tablet Take 1 tablet (20 mg total) by mouth 4 (four) times daily -  before meals and at bedtime for 10 days. 40 tablet 0   No current facility-administered medications for this visit.    Medication Side Effects: None  Allergies:  Allergies  Allergen Reactions   Adhesive [Tape] Other (See Comments)    Blisters    Codeine Nausea And Vomiting   Latex Other (See Comments)    blisters    Past Medical History:  Diagnosis Date   Anxiety    Anxiety    Arthritis    Depression    Hyperlipidemia    Insomnia    Lymphadenopathy, retroperitoneal    OSA (obstructive sleep apnea)    Parkinson disease (HCC)    Sciatica    Seizures (HCC)    Spinal stenosis    Spinal stenosis    Spondylisthesis     History reviewed. No pertinent family history.  Social History   Socioeconomic History   Marital status: Married    Spouse name: Not on file   Number of children: Not on file   Years of education: Not on file   Highest education level: Not on file  Occupational History   Not on file  Tobacco Use   Smoking status: Never   Smokeless tobacco: Never  Vaping Use   Vaping Use: Never used  Substance and Sexual Activity   Alcohol use: Yes    Alcohol/week: 2.0 standard drinks of alcohol    Types: 2 Cans of beer per week    Comment: NONE LAST 24 HRS   Drug use: No   Sexual activity: Yes  Other Topics Concern   Not on file  Social History Narrative   Not on file   Social Determinants of Health   Financial Resource Strain: Not on file  Food Insecurity: Not on file  Transportation Needs: Not on file  Physical Activity: Not on file  Stress: Not on file  Social Connections: Not on file  Intimate Partner Violence: Not on file    Past Medical History, Surgical history, Social  history, and Family history were reviewed and updated as appropriate.   Please see review of systems for further details on the patient's review from today.   Objective:   Physical Exam:  There were no vitals taken for this visit.  Physical Exam Constitutional:      General: He is not in acute distress.    Appearance: He is well-developed. He is obese.  Musculoskeletal:        General: No deformity.  Neurological:     Mental Status: He is alert and oriented to person, place, and time.     Coordination: Coordination normal.  Psychiatric:        Attention and Perception: Attention and perception normal. He does not perceive auditory or visual hallucinations.        Mood and Affect: Mood is anxious. Mood is not depressed. Affect is not labile, angry, tearful or inappropriate.        Speech: Speech normal.        Behavior: Behavior normal.        Thought Content: Thought content normal. Thought content is not delusional. Thought content does not include homicidal or suicidal ideation. Thought content does not include suicidal plan.        Cognition and Memory: Cognition and memory normal.        Judgment: Judgment normal.     Comments: Insight intact. No delusions.  More irritable without reason     Lab Review:     Component Value Date/Time   NA 137 10/23/2021 1340   K 3.7 10/23/2021 1340   CL 102 10/23/2021 1340   CO2 27 10/23/2021 1340   GLUCOSE 144 (H) 10/23/2021 1340   BUN 12 10/23/2021 1340   CREATININE 1.22 10/23/2021 1340   CALCIUM 9.4 10/23/2021 1340   PROT 7.7 10/23/2021 1340   ALBUMIN 4.5 10/23/2021 1340   AST 23 10/23/2021 1340   ALT 6 10/23/2021 1340   ALKPHOS 57 10/23/2021 1340   BILITOT 0.8 10/23/2021 1340   GFRNONAA >60 10/23/2021 1340       Component Value Date/Time   WBC 7.9 10/23/2021 1340   RBC 4.71 10/23/2021 1340   HGB 14.5 10/23/2021 1340   HCT 42.6 10/23/2021 1340   PLT 198 10/23/2021 1340   MCV 90.4 10/23/2021 1340   MCH 30.8 10/23/2021  1340   MCHC 34.0 10/23/2021 1340   RDW 12.7 10/23/2021 1340   LYMPHSABS 2.7 02/14/2021 1510   MONOABS 0.8 02/14/2021 1510   EOSABS 0.4 02/14/2021 1510   BASOSABS 0.1 02/14/2021 1510    No results found for: "POCLITH", "LITHIUM"   No results found for: "PHENYTOIN", "PHENOBARB", "VALPROATE", "CBMZ"   .res Assessment: Plan:    Jaymarion was seen today for follow-up, depression, anxiety, post-traumatic stress disorder and sleeping problem.  Diagnoses and all orders for this visit:  Depression, major, recurrent, in partial remission (HCC)  Insomnia due to mental condition -     clonazePAM (KLONOPIN) 0.5 MG tablet; Take 1/2 tab po BID prn anxiety  Generalized anxiety disorder  PTSD (post-traumatic stress disorder)   Greater than 50% of 30 min face to face time with patient was spent on counseling and coordination of care. We discussed  ongoing sleep problems  triggering irritability.    Discussed potential metabolic side effects associated with atypical antipsychotics, as well as potential risk for movement side effects. Advised pt to contact office if movement side effects occur.  responded to Rexulti 2 mg with resolution of sx. Had to switch to pramipexole for cost reasons. Still doing oK.  Rexulti worked better.  We discussed the short-term risks associated with benzodiazepines including sedation and increased fall risk among others.  Discussed long-term side effect risk including dependence, potential withdrawal symptoms, and the  potential eventual dose-related risk of dementia.  But recent studies from 2020 dispute this association between benzodiazepines and dementia risk. Newer studies in 2020 do not support an association with dementia. Disc Bz risk with sleep bc anxiety with it and doesn't feel well and on max SSRI.  Disc pros and cons given failure of non-BZ for sleep.   Is also more irritable. Yes trial clonazepam 0.5-1.0 mg HS  Good to have switched DT recent dx PD.  Disc  pramipexole in reference to dx PD.  Dr. Sherryll Burger ok with it's low dose added on for off-label treatment of depression. Continue pramipexole 0.25 mg BID Sertraline 200 daily  Switch to decaf by 3 PM.  Emphasized this  He's done this   Disc SE in detail and SSRI withdrawal sx.  Supportive therapy dealing with   FU 2 mos.  Meredith Staggers, MD, DFAPA    Please see After Visit Summary for patient specific instructions.  No future appointments.   No orders of the defined types were placed in this encounter.    -------------------------------

## 2022-04-17 ENCOUNTER — Other Ambulatory Visit: Payer: Self-pay | Admitting: Student

## 2022-04-17 DIAGNOSIS — G8929 Other chronic pain: Secondary | ICD-10-CM

## 2022-04-25 ENCOUNTER — Other Ambulatory Visit: Payer: Self-pay | Admitting: Student

## 2022-04-25 DIAGNOSIS — M545 Low back pain, unspecified: Secondary | ICD-10-CM

## 2022-05-03 ENCOUNTER — Ambulatory Visit
Admission: RE | Admit: 2022-05-03 | Discharge: 2022-05-03 | Disposition: A | Discharge status: Home / Self Care | Payer: BC Managed Care – PPO | Source: Ambulatory Visit | Attending: Student | Admitting: Student

## 2022-05-03 DIAGNOSIS — M545 Low back pain, unspecified: Secondary | ICD-10-CM | POA: Diagnosis present

## 2022-05-03 DIAGNOSIS — G8929 Other chronic pain: Secondary | ICD-10-CM | POA: Insufficient documentation

## 2022-05-22 ENCOUNTER — Other Ambulatory Visit: Payer: Self-pay | Admitting: Psychiatry

## 2022-05-22 DIAGNOSIS — F3341 Major depressive disorder, recurrent, in partial remission: Secondary | ICD-10-CM

## 2022-06-16 ENCOUNTER — Other Ambulatory Visit: Payer: Self-pay | Admitting: Psychiatry

## 2022-06-16 DIAGNOSIS — F411 Generalized anxiety disorder: Secondary | ICD-10-CM

## 2022-06-16 DIAGNOSIS — F3341 Major depressive disorder, recurrent, in partial remission: Secondary | ICD-10-CM

## 2022-06-16 DIAGNOSIS — F4310 Post-traumatic stress disorder, unspecified: Secondary | ICD-10-CM

## 2022-07-01 ENCOUNTER — Ambulatory Visit (INDEPENDENT_AMBULATORY_CARE_PROVIDER_SITE_OTHER): Payer: BC Managed Care – PPO | Admitting: Psychiatry

## 2022-07-01 ENCOUNTER — Encounter: Payer: Self-pay | Admitting: Psychiatry

## 2022-07-01 DIAGNOSIS — F3341 Major depressive disorder, recurrent, in partial remission: Secondary | ICD-10-CM

## 2022-07-01 DIAGNOSIS — F411 Generalized anxiety disorder: Secondary | ICD-10-CM

## 2022-07-01 DIAGNOSIS — F5105 Insomnia due to other mental disorder: Secondary | ICD-10-CM | POA: Diagnosis not present

## 2022-07-01 DIAGNOSIS — F4310 Post-traumatic stress disorder, unspecified: Secondary | ICD-10-CM

## 2022-07-01 DIAGNOSIS — G20C Parkinsonism, unspecified: Secondary | ICD-10-CM

## 2022-07-01 MED ORDER — CLONAZEPAM 0.5 MG PO TABS: ORAL_TABLET | ORAL | 2 refills | Status: DC

## 2022-07-01 NOTE — Progress Notes (Signed)
Dakota Davis [id number] 1966-04-16 56 y.o.  Subjective:   Patient ID:  Dakota Davis is a 56 y.o. (DOB July 03, 1966) male.  Chief Complaint:  Chief Complaint  Patient presents with   Follow-up   Anxiety   Depression   Post-Traumatic Stress Disorder    Depression        Associated symptoms include no headaches.  Past medical history includes anxiety.   Anxiety Patient reports no dizziness, nervous/anxious behavior or palpitations.     Dakota Davis presents to the office today for follow-up of generalized anxiety disorder with elements of PTSD and major depression.  seen October 2020.  No meds were changed.  He remained on sertraline 200, aripiprazole 10 mg and Sonata or trazodone as needed insomnia.  January 11, 2020 appointment the following is noted: Had Covid Feb with flu-like sx and fatigue lasted several days. At times feels he's slipping into a bit more depression with less motivation and isolating.  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  Patient reports stable mood and denies depressed or irritable moods.  Patient with residual anxiety which sometimes interferes with sleep.  Sonata helps prn.  2-3 days a week can be hard to shut off brain.  Occ EFA.Marland Kitchen Usually 7-8 hour of sleep.  OCc daytime tiredness. Denies appetite disturbance.  Patient reports that energy and motivation have been good.  Patient denies any difficulty with concentration.  Patient denies any suicidal ideation. Plan:  He wanted to try Rexulti 2 mg augmentation  05/14/20 appt with the following noted: Rexulti for 2 mos helped but cost $250/mo copay and couldn't afford.  Felt worse off of it. Anxiety out the roof with uncertain cause. Seems worse than ever.  Varying subjects for worry.  Can't seem to get a hold of it.  Wife notices he's distant for weeks. Rexulti helped significantly.   Plan: sampled [place]  07/11/2020 appointment with the following  noted: Been able to stay on [place] and is better on it.  Clear difference.  Anxiety is better and manageable.  Mild depression comes and goes and manageable. No SE. Sleep not great with trouble staying asleep.  EFA with variable times.   Plan: Clearly better with Rexulti 2 mg in addition to sertraline 200 mg daily. Recommend a trial of trazodone at 100 250 mg nightly for insomnia  09/21/2020 phone call from patient stating he could no longer afford Rexulti.  He had already failed Abilify.  He is likely to be unable to afford the other partial dopamine agonist Vraylar.  Therefore initiated off label trial of pramipexole to take the place of the [place].  Patient has a pending appointment in 3 weeks.  10/11/2020 appointment with the following noted: Ran out of [place] bc cost with new year was going to be $1000.   Overall still doing fine and has been since last seen.  Not noticed any changes so far with switch from Rexulti to pramipexole. Sleep is better.  Awakens but back to sleep.  Depression and anxiety are manageable. Patient reports stable mood and denies depressed or irritable moods.  Patient denies any recent difficulty with anxiety.  Patient denies difficulty with sleep initiation or maintenance. Denies appetite disturbance.  Patient reports that energy and motivation have been good.  Patient denies any difficulty with concentration.  Patient denies any suicidal ideation. No SE Plan: Clearly better with Rexulti 2 mg in addition to sertraline 200 mg daily. So far has been OK  with 3 week change to pramipexole but he could still relaps bc of long half life of Rexulti. Switch for sleep to trazodone.  01/07/21 appt noted: Ok overall.  Noticed some things he just wrote off like finger twitching and now resting tremor on R hand and when does something it goes away.  Went to PCP and awaiting referral to neurologist.  Also had some balance issues for some months also and wonders about PD.    Depression is OK.  Caffeine as late as bedtime.  Coke Zero and diet Mtn Dew. Plan: No med changes  03/19/2021 appointment with the following noted: Reviewed neuro note 02/21/2021 Dr. Sherryll Burger who suspects Parkinson's disease and started carbidopa levodopa. Maybe less stiffness with Sinemet. Pretty good re: mental health since here.  Trazodone helps some with sleep. No SE Plan: Continue pramipexole 0.25 mg BID Sertraline 200 daily trazodone 100 -200 mg HS helps go asleep but doesn't stay asleep..   Switch to decaf by 3 PM.  Emphasized this  07/22/21 appt noted: Still pretty well.  Depression and anxiety pretty under control. Continue meds as above. No SE CO EFA/EMA. Awakens after 3 hours. Sometimes back to sleep.  Has OSA and on CPAP. Usually works daytime so little napping. Plan: Continue pramipexole 0.25 mg BID Sertraline 200 daily Switch trazodone bc doesn't stay asleep to hydroxyzine 25-50 mg HS.Marland Kitchen  Also gave RX Belsomra samples in case it failed. 10-20 Switch to decaf by 3 PM.  Emphasized this  He's done this  11/12/21 appt noted: Hydroxyzine works but hangover  and lingers through the day with 25 mg. Belsomra also hangover but did keep him asleep better. Sleep same ith decent effect initially with trazodone.  Sleep is not as good as needed and some irritability.   Mood pretty fair without major problems. Patient reports stable mood and denies depressed or irritable moods.  Patient denies any recent difficulty with anxiety other than mild background levels.  His wife and her family are close and create stress.   Denies appetite disturbance.  Patient reports that energy and motivation have been good.  Patient denies any difficulty with concentration.  Patient denies any suicidal ideation. Plan: Continue pramipexole 0.25 mg BID Sertraline 200 daily Switch trazodone bc doesn't stay asleep to hydroxyzine 25-50 mg HS..  Trial Quiviviq 50 mg HS Switch to decaf by 3 PM.  Emphasized this   He's done this  02/11/2022 appointment with the following noted: Can't get anything to help stay asleep.  Trazodone best so far bc without hangover. Not sure how much sleep.  Varies.  Last week 3 AM wide awake. Anxiety is higher than it was and more irritable without trigger.  Hard to shut off mind at night. Plan: Yes trial Restoril 15 mg 1-2 HS for EMA  04/14/22 appt noted: On temazepam  30 mg HS and off trazodone. On sertraline 200 mg  and pramipexole 0.25 mg BID and Sinemet. Still sleep px avg 3 hours.  Sleep terrible.  Trouble getting back to sleep. Back pain will awaken him and goes to LR and into chair.  Not always the cause.  No pain meds. Work up pending with hx fusion.  Also has nausea. 25th wedding anniversary and to [place] this week. Son 3 strokes as child from heart px as a kid. Plan: Yes trial clonazepam 0.5-1.0 mg HS Good to have switched DT recent dx PD.  Disc pramipexole in reference to dx PD.  Dr. Sherryll Burger ok with it's low  dose added on for off-label treatment of depression. Continue pramipexole 0.25 mg BID Sertraline 200 daily  10 07/01/2022 appointment noted: Continues above meds.  Also on Sinemet. Taking clonazepam 0.5 mg HS for sleep.  No hangover; no SE.  Still trouble staying asleep and EMA.  Really bad.  To bed 11 and up 230 for couple hours.  The next day not overy tired or sleep  4 hours at night without naps.Marland Kitchen  asks to increase clonazepam. Sometimes fuse a little shorter than should be but recognizes it and can manage it. Not sig depressed noor anxious Can tell PD is progressing somewhat.  Some dystonia at night with cramps at night.  Also more right tremor. Handling it with grace and information.   Past Psychiatric Medication Trials: Sertraline 200, venlafaxine 225, paroxetine Abilify 10, Rexulti 2,  Pramipexole  buspirone 30 twice daily, doxazosin 8,   Sonata ? effect, trazodone 200 EMA, hydroxyzine 25 helped a little. hangover, Belsomra hangover,  Quvivik NR & hangover, [place] NR, temazepam 30 NR Temazepam NR Clonazepam 0.5 mg HS no SE min response  Review of Systems:  Review of Systems  Cardiovascular:  Negative for palpitations.  Gastrointestinal:  Negative for abdominal pain.  Genitourinary:  Positive for frequency.  Musculoskeletal:  Positive for back pain.  Neurological:  Positive for tremors. Negative for dizziness, weakness and headaches.       Balance and some stiffness  Psychiatric/Behavioral:  Positive for sleep disturbance. The patient is not nervous/anxious.     Medications: I have reviewed the patient's current medications.  Current Outpatient Medications  Medication Sig Dispense Refill   carbidopa-levodopa (SINEMET IR) 25-100 MG tablet Take 1 tablet by mouth 3 (three) times daily.     pramipexole (MIRAPEX) 0.25 MG tablet TAKE ONE TABLET BY MOUTH TWICE DAILY 180 tablet 0   rosuvastatin (CRESTOR) 20 MG tablet Take 20 mg by mouth daily.     sertraline (ZOLOFT) 100 MG tablet TAKE TWO TABLETS BY MOUTH EVERY DAY 180 tablet 0   clonazePAM (KLONOPIN) 0.5 MG tablet Take 1 and 1/2 to 2  tablets at night as needed for sleep 60 tablet 2   dicyclomine (BENTYL) 20 MG tablet Take 1 tablet (20 mg total) by mouth 4 (four) times daily -  before meals and at bedtime for 10 days. 40 tablet 0   No current facility-administered medications for this visit.    Medication Side Effects: None  Allergies:  Allergies  Allergen Reactions   Adhesive [Tape] Other (See Comments)    Blisters    Codeine Nausea And Vomiting   Latex Other (See Comments)    blisters    Past Medical History:  Diagnosis Date   Anxiety    Anxiety    Arthritis    Depression    Hyperlipidemia    Insomnia    Lymphadenopathy, retroperitoneal    OSA (obstructive sleep apnea)    Parkinson disease    Sciatica    Seizures (HCC)    Spinal stenosis    Spinal stenosis    Spondylisthesis     History reviewed. No pertinent family history.  Social History    Socioeconomic History   Marital status: Married    Spouse name: Not on file   Number of children: Not on file   Years of education: Not on file   Highest education level: Not on file  Occupational History   Not on file  Tobacco Use   Smoking status: Never   Smokeless tobacco: Never  Vaping Use   Vaping Use: Never used  Substance and Sexual Activity   Alcohol use: Yes    Alcohol/week: 2.0 standard drinks of alcohol    Types: 2 Cans of beer per week    Comment: NONE LAST 24 HRS   Drug use: No   Sexual activity: Yes  Other Topics Concern   Not on file  Social History Narrative   Not on file   Social Determinants of Health   Financial Resource Strain: Not on file  Food Insecurity: Not on file  Transportation Needs: Not on file  Physical Activity: Not on file  Stress: Not on file  Social Connections: Not on file  Intimate Partner Violence: Not on file    Past Medical History, Surgical history, Social history, and Family history were reviewed and updated as appropriate.   Please see review of systems for further details on the patient's review from today.   Objective:   Physical Exam:  There were no vitals taken for this visit.  Physical Exam Constitutional:      General: He is not in acute distress.    Appearance: He is well-developed. He is obese.  Musculoskeletal:        General: No deformity.  Neurological:     Mental Status: He is alert and oriented to person, place, and time.     Coordination: Coordination normal.  Psychiatric:        Attention and Perception: Attention and perception normal. He does not perceive auditory or visual hallucinations.        Mood and Affect: Mood is anxious. Mood is not depressed. Affect is not labile, angry, tearful or inappropriate.        Speech: Speech normal.        Behavior: Behavior normal.        Thought Content: Thought content normal. Thought content is not delusional. Thought content does not include homicidal or  suicidal ideation. Thought content does not include suicidal plan.        Cognition and Memory: Cognition and memory normal.        Judgment: Judgment normal.     Comments: Insight intact. No delusions.  less irritable      Lab Review:     Component Value Date/Time   NA 137 10/23/2021 1340   K 3.7 10/23/2021 1340   CL 102 10/23/2021 1340   CO2 27 10/23/2021 1340   GLUCOSE 144 (H) 10/23/2021 1340   BUN 12 10/23/2021 1340   CREATININE 1.22 10/23/2021 1340   CALCIUM 9.4 10/23/2021 1340   PROT 7.7 10/23/2021 1340   ALBUMIN 4.5 10/23/2021 1340   AST 23 10/23/2021 1340   ALT 6 10/23/2021 1340   ALKPHOS 57 10/23/2021 1340   BILITOT 0.8 10/23/2021 1340   GFRNONAA >60 10/23/2021 1340       Component Value Date/Time   WBC 7.9 10/23/2021 1340   RBC 4.71 10/23/2021 1340   HGB 14.5 10/23/2021 1340   HCT 42.6 10/23/2021 1340   PLT 198 10/23/2021 1340   MCV 90.4 10/23/2021 1340   MCH 30.8 10/23/2021 1340   MCHC 34.0 10/23/2021 1340   RDW 12.7 10/23/2021 1340   LYMPHSABS 2.7 02/14/2021 1510   MONOABS 0.8 02/14/2021 1510   EOSABS 0.4 02/14/2021 1510   BASOSABS 0.1 02/14/2021 1510    No results found for: "POCLITH", "LITHIUM"   No results found for: "PHENYTOIN", "PHENOBARB", "VALPROATE", "CBMZ"   .res Assessment: Plan:    Dakota Davis was seen today for  follow-up, anxiety, depression and post-traumatic stress disorder.  Diagnoses and all orders for this visit:  Depression, major, recurrent, in partial remission (HCC)  Insomnia due to mental condition -     clonazePAM (KLONOPIN) 0.5 MG tablet; Take 1 and 1/2 to 2  tablets at night as needed for sleep  Generalized anxiety disorder  PTSD (post-traumatic stress disorder)  Primary parkinsonism   Greater than 50% of 30 min face to face time with patient was spent on counseling and coordination of care. We discussed  ongoing sleep problems  triggering irritability.    Discussed potential metabolic side effects associated with  atypical antipsychotics, as well as potential risk for movement side effects. Advised pt to contact office if movement side effects occur.  responded to Rexulti 2 mg with resolution of sx. Had to switch to pramipexole for cost reasons. Still doing oK.  Rexulti worked better.  We discussed the short-term risks associated with benzodiazepines including sedation and increased fall risk among others.  Discussed long-term side effect risk including dependence, potential withdrawal symptoms, and the potential eventual dose-related risk of dementia.  But recent studies from 2020 dispute this association between benzodiazepines and dementia risk. Newer studies in 2020 do not support an association with dementia. Disc Bz risk with sleep bc anxiety with it and doesn't feel well and on max SSRI.  Disc pros and cons given failure of non-BZ for sleep.   Is also more irritable. Yes trial clonazepam 0.5-1.0 mg HS  Good to have switched DT recent dx PD.  Disc pramipexole in reference to dx PD.  Dr. Sherryll Burger ok with it's low dose added on for off-label treatment of depression. Continue pramipexole 0.25 mg BID Sertraline 200 daily  Switch to decaf by 3 PM.  Emphasized this  He's done this   Disc SE in detail and SSRI withdrawal sx.  Supportive therapy dealing with   FU 2-3 mos.  Meredith Staggers, MD, DFAPA    Please see After Visit Summary for patient specific instructions.  No future appointments.   No orders of the defined types were placed in this encounter.    -------------------------------

## 2022-08-06 ENCOUNTER — Other Ambulatory Visit: Payer: Self-pay | Admitting: Neurology

## 2022-08-06 DIAGNOSIS — M542 Cervicalgia: Secondary | ICD-10-CM

## 2022-08-18 ENCOUNTER — Ambulatory Visit
Admission: RE | Admit: 2022-08-18 | Discharge: 2022-08-18 | Disposition: A | Discharge status: Home / Self Care | Payer: BC Managed Care – PPO | Source: Ambulatory Visit | Attending: Neurology | Admitting: Neurology

## 2022-08-18 DIAGNOSIS — M542 Cervicalgia: Secondary | ICD-10-CM | POA: Diagnosis present

## 2022-08-20 ENCOUNTER — Other Ambulatory Visit: Payer: Self-pay | Admitting: Psychiatry

## 2022-08-20 DIAGNOSIS — F3341 Major depressive disorder, recurrent, in partial remission: Secondary | ICD-10-CM

## 2022-08-31 ENCOUNTER — Other Ambulatory Visit: Payer: Self-pay

## 2022-08-31 ENCOUNTER — Emergency Department: Payer: BC Managed Care – PPO

## 2022-08-31 DIAGNOSIS — J101 Influenza due to other identified influenza virus with other respiratory manifestations: Secondary | ICD-10-CM | POA: Diagnosis not present

## 2022-08-31 DIAGNOSIS — Z1152 Encounter for screening for COVID-19: Secondary | ICD-10-CM | POA: Insufficient documentation

## 2022-08-31 DIAGNOSIS — G20C Parkinsonism, unspecified: Secondary | ICD-10-CM | POA: Diagnosis not present

## 2022-08-31 DIAGNOSIS — Z79899 Other long term (current) drug therapy: Secondary | ICD-10-CM | POA: Insufficient documentation

## 2022-08-31 DIAGNOSIS — R509 Fever, unspecified: Secondary | ICD-10-CM | POA: Diagnosis present

## 2022-08-31 LAB — COMPREHENSIVE METABOLIC PANEL
ALT: 6 U/L (ref 0–44)
AST: 23 U/L (ref 15–41)
Albumin: 4.3 g/dL (ref 3.5–5.0)
Alkaline Phosphatase: 65 U/L (ref 38–126)
Anion gap: 9 (ref 5–15)
BUN: 12 mg/dL (ref 6–20)
CO2: 26 mmol/L (ref 22–32)
Calcium: 8.9 mg/dL (ref 8.9–10.3)
Chloride: 101 mmol/L (ref 98–111)
Creatinine, Ser: 1.07 mg/dL (ref 0.61–1.24)
GFR, Estimated: >60 mL/min (ref >60)
Glucose, Bld: 106 mg/dL — ABNORMAL HIGH (ref 70–99)
Potassium: 3.4 mmol/L — ABNORMAL LOW (ref 3.5–5.1)
Sodium: 136 mmol/L (ref 135–145)
Total Bilirubin: 1 mg/dL (ref 0.3–1.2)
Total Protein: 7.4 g/dL (ref 6.5–8.1)

## 2022-08-31 NOTE — ED Triage Notes (Signed)
Fever onset this evening within a couple of hours. Wife and patient report highest fever got was 104F. At that point wife states patient was confused and "talking nonsense". Wife gave 1000mg  Tylenol at 40. Pt A&Ox4, reports weakness/fatigue/generalized body aches. Pt denies CP, SOB.

## 2022-08-31 NOTE — ED Notes (Signed)
Covid swab sent to the lab at this time.  

## 2022-09-01 ENCOUNTER — Emergency Department
Admission: EM | Admit: 2022-09-01 | Discharge: 2022-09-01 | Disposition: A | Discharge status: Home / Self Care | Payer: BC Managed Care – PPO | Attending: Emergency Medicine | Admitting: Emergency Medicine

## 2022-09-01 ENCOUNTER — Emergency Department: Payer: BC Managed Care – PPO

## 2022-09-01 DIAGNOSIS — J101 Influenza due to other identified influenza virus with other respiratory manifestations: Secondary | ICD-10-CM

## 2022-09-01 LAB — CBC WITH DIFFERENTIAL/PLATELET
Abs Immature Granulocytes: 0.02 10*3/uL (ref 0.00–0.07)
Basophils Absolute: 0.1 10*3/uL (ref 0.0–0.1)
Basophils Relative: 1 %
Eosinophils Absolute: 0.2 10*3/uL (ref 0.0–0.5)
Eosinophils Relative: 3 %
HCT: 39.5 % (ref 39.0–52.0)
Hemoglobin: 13 g/dL (ref 13.0–17.0)
Immature Granulocytes: 0 %
Lymphocytes Relative: 8 %
Lymphs Abs: 0.6 10*3/uL — ABNORMAL LOW (ref 0.7–4.0)
MCH: 30.2 pg (ref 26.0–34.0)
MCHC: 32.9 g/dL (ref 30.0–36.0)
MCV: 91.9 fL (ref 80.0–100.0)
Monocytes Absolute: 0.7 10*3/uL (ref 0.1–1.0)
Monocytes Relative: 11 %
Neutro Abs: 5.2 10*3/uL (ref 1.7–7.7)
Neutrophils Relative %: 77 %
Platelets: 168 10*3/uL (ref 150–400)
RBC: 4.3 MIL/uL (ref 4.22–5.81)
RDW: 13 % (ref 11.5–15.5)
WBC: 6.8 10*3/uL (ref 4.0–10.5)
nRBC: 0 % (ref 0.0–0.2)

## 2022-09-01 LAB — URINALYSIS, ROUTINE W REFLEX MICROSCOPIC
Glucose, UA: NEGATIVE mg/dL
Hgb urine dipstick: NEGATIVE
Ketones, ur: 5 mg/dL — AB
Leukocytes,Ua: NEGATIVE
Nitrite: NEGATIVE
Protein, ur: NEGATIVE mg/dL
Specific Gravity, Urine: 1.023 (ref 1.005–1.030)
pH: 5 (ref 5.0–8.0)

## 2022-09-01 LAB — TROPONIN I (HIGH SENSITIVITY): Troponin I (High Sensitivity): 14 ng/L (ref <18)

## 2022-09-01 LAB — RESP PANEL BY RT-PCR (RSV, FLU A&B, COVID)  RVPGX2
Influenza A by PCR: POSITIVE — AB
Influenza B by PCR: NEGATIVE
Resp Syncytial Virus by PCR: NEGATIVE
SARS Coronavirus 2 by RT PCR: NEGATIVE

## 2022-09-01 MED ORDER — ACETAMINOPHEN 500 MG PO TABS
1000.0000 mg | ORAL_TABLET | Freq: Once | ORAL | Status: AC
  Administered 2022-09-01: 1000 mg via ORAL
  Filled 2022-09-01: qty 2

## 2022-09-01 MED ORDER — ONDANSETRON 4 MG PO TBDP: 4.0000 mg | ORAL_TABLET | Freq: Four times a day (QID) | ORAL | 0 refills | Status: AC | PRN

## 2022-09-01 MED ORDER — OSELTAMIVIR PHOSPHATE 75 MG PO CAPS: 75.0000 mg | ORAL_CAPSULE | Freq: Two times a day (BID) | ORAL | 0 refills | Status: AC

## 2022-09-01 NOTE — ED Provider Notes (Signed)
[facility] Provider Note    Event Date/Time   First MD Initiated Contact with Patient 09/01/22 0315     (approximate)   History   Fever   HPI  Dakota Davis is a 56 y.o. male with history of Parkinson's, seizures, hyperlipidemia, spinal stenosis who presents to the emergency department with complaints of fevers, body aches, cough and congestion that started today.  No vomiting or diarrhea.  Wife reports he started "talking out of his head" tonight and she checked his temperature and it was 104.  They have not yet been vaccinated for the flu.   History provided by patient and wife.    Past Medical History:  Diagnosis Date   Anxiety    Anxiety    Arthritis    Depression    Hyperlipidemia    Insomnia    Lymphadenopathy, retroperitoneal    OSA (obstructive sleep apnea)    Parkinson disease    Sciatica    Seizures (HCC)    Spinal stenosis    Spinal stenosis    Spondylisthesis     Past Surgical History:  Procedure Laterality Date   BACK SURGERY     BACK SURGERY     10/31/2016,12/21/2015   CARDIAC CATHETERIZATION     ESOPHAGOGASTRODUODENOSCOPY N/A 11/27/2021   Procedure: ESOPHAGOGASTRODUODENOSCOPY (EGD);  Surgeon: Toledo, Boykin Nearing, MD;  Location: [facility] ENDOSCOPY;  Service: Gastroenterology;  Laterality: N/A;   KNEE ARTHROSCOPY     2018   LEFT HEART CATH AND CORONARY ANGIOGRAPHY N/A 08/05/2017   Procedure: LEFT HEART CATH AND CORONARY ANGIOGRAPHY;  Surgeon: Lamar Blinks, MD;  Location: [facility] INVASIVE CV LAB;  Service: Cardiovascular;  Laterality: N/A;   TONSILECTOMY/ADENOIDECTOMY WITH MYRINGOTOMY      MEDICATIONS:  Prior to Admission medications   Medication Sig Start Date End Date Taking? Authorizing Provider  carbidopa-levodopa (SINEMET IR) 25-100 MG tablet Take 1 tablet by mouth 3 (three) times daily.    [provider]  clonazePAM (KLONOPIN) 0.5 MG tablet Take 1 and 1/2 to 2  tablets at night as needed for sleep 07/01/22    Cottle, Steva Ready., MD  dicyclomine (BENTYL) 20 MG tablet Take 1 tablet (20 mg total) by mouth 4 (four) times daily -  before meals and at bedtime for 10 days. 10/23/21 11/02/21  Shaune Pollack, MD  pramipexole (MIRAPEX) 0.25 MG tablet TAKE ONE TABLET BY MOUTH TWICE DAILY 08/20/22   Cottle, Steva Ready., MD  rosuvastatin (CRESTOR) 20 MG tablet Take 20 mg by mouth daily.    [provider]  sertraline (ZOLOFT) 100 MG tablet TAKE TWO TABLETS BY MOUTH EVERY DAY 06/17/22   Cottle, Steva Ready., MD    Physical Exam   Triage Vital Signs: ED Triage Vitals  Enc Vitals Group     BP 08/31/22 2247 (!) 150/88     Pulse Rate 08/31/22 2247 88     Resp 08/31/22 2247 20     Temp 08/31/22 2247 99.9 F (37.7 C)     Temp Source 08/31/22 2247 Oral     SpO2 08/31/22 2247 99 %     Weight 08/31/22 2248 245 lb (111.1 kg)     Height 08/31/22 2248 6' (1.829 m)     Head Circumference --      Peak Flow --      Pain Score 08/31/22 2256 5     Pain Loc --      Pain Edu? --  Excl. in GC? --     Most recent vital signs: Vitals:   09/01/22 0314 09/01/22 0330  BP: 126/75 125/79  Pulse: (!) 101 91  Resp: 20 19  Temp: 99.5 F (37.5 C) 99.4 F (37.4 C)  SpO2: 98% 99%    CONSTITUTIONAL: Alert and oriented x 3 and responds appropriately to questions. Well-appearing; well-nourished HEAD: Normocephalic, atraumatic EYES: Conjunctivae clear, pupils appear equal, sclera nonicteric ENT: normal nose; moist mucous membranes NECK: Supple, normal ROM, no meningismus CARD: RRR; S1 and S2 appreciated; no murmurs, no clicks, no rubs, no gallops RESP: Normal chest excursion without splinting or tachypnea; breath sounds clear and equal bilaterally; no wheezes, no rhonchi, no rales, no hypoxia or respiratory distress, speaking full sentences ABD/GI: Normal bowel sounds; non-distended; soft, non-tender, no rebound, no guarding, no peritoneal signs BACK: The back appears normal EXT: Normal ROM in all joints; no  deformity noted, no edema; no cyanosis SKIN: Normal color for age and race; warm; no rash on exposed skin NEURO: Moves all extremities equally, normal speech, no facial asymmetry, ambulates with normal gait PSYCH: The patient's mood and manner are appropriate.   ED Results / Procedures / Treatments   LABS: (all labs ordered are listed, but only abnormal results are displayed) Labs Reviewed  RESP PANEL BY RT-PCR (RSV, FLU A&B, COVID)  RVPGX2 - Abnormal; Notable for the following components:      Result Value   Influenza A by PCR POSITIVE (*)    All other components within normal limits  URINALYSIS, ROUTINE W REFLEX MICROSCOPIC - Abnormal; Notable for the following components:   Color, Urine YELLOW (*)    APPearance CLEAR (*)    Bilirubin Urine SMALL (*)    Ketones, ur 5 (*)    All other components within normal limits  CBC WITH DIFFERENTIAL/PLATELET - Abnormal; Notable for the following components:   Lymphs Abs 0.6 (*)    All other components within normal limits  COMPREHENSIVE METABOLIC PANEL - Abnormal; Notable for the following components:   Potassium 3.4 (*)    Glucose, Bld 106 (*)    All other components within normal limits  TROPONIN I (HIGH SENSITIVITY)     EKG:  RADIOLOGY: My personal review and interpretation of imaging: Chest x-ray clear.  CT head unremarkable.  I have personally reviewed all radiology reports.   CT Head Wo Contrast  Result Date: 09/01/2022 CLINICAL DATA:  Fevers, altered mental status EXAM: CT HEAD WITHOUT CONTRAST TECHNIQUE: Contiguous axial images were obtained from the base of the skull through the vertex without intravenous contrast. RADIATION DOSE REDUCTION: This exam was performed according to the departmental dose-optimization program which includes automated exposure control, adjustment of the mA and/or kV according to patient size and/or use of iterative reconstruction technique. COMPARISON:  02/14/2021 FINDINGS: Brain: No evidence of acute  infarction, hemorrhage, mass, mass effect, or midline shift. No hydrocephalus or extra-axial fluid collection. Vascular: No hyperdense vessel. Skull: Normal. Negative for fracture or focal lesion. Sinuses/Orbits: Minimal mucosal thickening in the ethmoid air cells and left sphenoid sinus. The orbits are unremarkable. Other: The mastoid air cells are well aerated. IMPRESSION: No acute intracranial process. Electronically Signed   By: Wiliam Ke M.D.   On: 09/01/2022 00:53   DG Chest 2 View  Result Date: 08/31/2022 CLINICAL DATA:  Fever EXAM: CHEST - 2 VIEW COMPARISON:  None Available. FINDINGS: The heart size and mediastinal contours are within normal limits. Both lungs are clear. The visualized skeletal structures are unremarkable. IMPRESSION:  No active cardiopulmonary disease. Electronically Signed   By: Jasmine Pang M.D.   On: 08/31/2022 23:42     PROCEDURES:  Critical Care performed: No     Procedures    IMPRESSION / MDM / ASSESSMENT AND PLAN / ED COURSE  I reviewed the triage vital signs and the nursing notes.    Patient here with fever, cough, congestion and bodyaches.    DIFFERENTIAL DIAGNOSIS (includes but not limited to):   Flu, COVID, pneumonia, UTI, less likely meningitis, encephalitis.  No longer confused now that fever has defervesced.   Patient's presentation is most consistent with acute presentation with potential threat to life or bodily function.   PLAN: Workup obtained from triage.  Normal labs.  No leukocytosis.  Normal electrolytes, renal function and LFTs.  Troponin negative.  Urine shows no sign of infection.  He is flu positive.  CT head and chest x-ray reviewed and interpreted by myself and the radiologist and shows no acute abnormality.  Nontoxic-appearing here and currently alert and oriented and neurologically intact.  Does not appear septic today.  Tolerating p.o.  Suspect episode of confusion was secondary to high temperature.  Have offered Tamiflu  and discussed risk and benefits.  Patient would like to proceed with prescription especially given his wife is immunocompromised on Humira.  She is seeing her rheumatologist today to discuss if she should be on prophylactic Tamiflu for the next 10 days.  Will give Tylenol here for symptomatic relief.  Patient feels comfortable with plan for discharge home.   MEDICATIONS GIVEN IN ED: Medications  acetaminophen (TYLENOL) tablet 1,000 mg (1,000 mg Oral Given 09/01/22 0356)     ED COURSE:  At this time, I do not feel there is any life-threatening condition present. I reviewed all nursing notes, vitals, pertinent previous records.  All lab and urine results, EKGs, imaging ordered have been independently reviewed and interpreted by myself.  I reviewed all available radiology reports from any imaging ordered this visit.  Based on my assessment, I feel the patient is safe to be discharged home without further emergent workup and can continue workup as an outpatient as needed. Discussed all findings, treatment plan as well as usual and customary return precautions.  They verbalize understanding and are comfortable with this plan.  Outpatient follow-up has been provided as needed.  All questions have been answered.    CONSULTS: Admission considered but altered mental status has resolved.  Patient flu positive, nontoxic in appearance without hypoxia or increased work of breathing.   OUTSIDE RECORDS REVIEWED:  Reviewed last cardiology note and October 2023 with Dr. Gwen Pounds.         FINAL CLINICAL IMPRESSION(S) / ED DIAGNOSES   Final diagnoses:  Influenza A     Rx / DC Orders   ED Discharge Orders          Ordered    oseltamivir (TAMIFLU) 75 MG capsule  2 times daily        09/01/22 0345    ondansetron (ZOFRAN-ODT) 4 MG disintegrating tablet  Every 6 hours PRN        09/01/22 0345             Note:  This document was prepared using Dragon voice recognition software and may include  unintentional dictation errors.   Rheannon Cerney, Layla Maw, DO 09/01/22 [phone]

## 2022-09-01 NOTE — Discharge Instructions (Addendum)
You may take Tylenol 1000 mg every 6 hours as needed for fever and pain. Please rest and drink plenty of fluids. This is a viral illness causing your symptoms. You do not need antibiotics for a virus. You may use over-the-counter nasal saline spray and Afrin nasal saline spray as needed for nasal congestion. Please do not use Afrin for more than 3 days in a row. You may use guaifenesin and dextromethorphan as needed for cough.  You may use lozenges and Chloraseptic spray to help with sore throat.  Warm salt water gargles can also help with sore throat.  You may use over-the-counter Unisom (doxyalamine) or Benadryl (diphenhydramine) to help with sleep.  Please note that some combination medicines such as DayQuil and NyQuil have multiple medications in them.  Please make sure you look at all labels to ensure that you are not taking too much of any one particular medication.  Symptoms from a virus may take 7-14 days to run its course.  The flu is treated like any other virus with supportive measures as listed above.  We have provided you with a prescription for Tamiflu which is an antiviral medication.  This medication has been shown to shorten the course of the flu by about 1 to 2 days.  These medications have to be taken within the first 48 hours of symptoms.  Tamiflu has many side effects including nausea, vomiting and diarrhea.

## 2022-09-16 ENCOUNTER — Other Ambulatory Visit: Payer: Self-pay | Admitting: Psychiatry

## 2022-09-16 DIAGNOSIS — F4310 Post-traumatic stress disorder, unspecified: Secondary | ICD-10-CM

## 2022-09-16 DIAGNOSIS — F3341 Major depressive disorder, recurrent, in partial remission: Secondary | ICD-10-CM

## 2022-09-16 DIAGNOSIS — F411 Generalized anxiety disorder: Secondary | ICD-10-CM

## 2022-10-02 ENCOUNTER — Ambulatory Visit: Payer: BC Managed Care – PPO | Admitting: Psychiatry

## 2022-10-02 ENCOUNTER — Encounter: Payer: Self-pay | Admitting: Psychiatry

## 2022-10-02 DIAGNOSIS — M542 Cervicalgia: Secondary | ICD-10-CM

## 2022-10-02 DIAGNOSIS — F3341 Major depressive disorder, recurrent, in partial remission: Secondary | ICD-10-CM

## 2022-10-02 DIAGNOSIS — F5105 Insomnia due to other mental disorder: Secondary | ICD-10-CM

## 2022-10-02 DIAGNOSIS — F411 Generalized anxiety disorder: Secondary | ICD-10-CM | POA: Diagnosis not present

## 2022-10-02 DIAGNOSIS — F4310 Post-traumatic stress disorder, unspecified: Secondary | ICD-10-CM | POA: Diagnosis not present

## 2022-10-02 MED ORDER — NORTRIPTYLINE HCL 10 MG PO CAPS: 10.0000 mg | ORAL_CAPSULE | Freq: Every day | ORAL | 0 refills | Status: DC

## 2022-10-02 NOTE — Progress Notes (Signed)
Dakota Davis [id number] [birth year] 57 y.o.  Subjective:   Patient ID:  Dakota Davis is a 57 y.o. (DOB 05-30-1966) male.  Chief Complaint:  Chief Complaint  Patient presents with   Follow-up   Depression   Anxiety   Post-Traumatic Stress Disorder    Depression        Associated symptoms include no headaches.  Past medical history includes anxiety.   Anxiety Patient reports no dizziness, nervous/anxious behavior or palpitations.     Dakota Davis presents to the office today for follow-up of generalized anxiety disorder with elements of PTSD and major depression.  seen October 2020.  No meds were changed.  He remained on sertraline 200, aripiprazole 10 mg and Sonata or trazodone as needed insomnia.  January 11, 2020 appointment the following is noted: Had Covid Feb with flu-like sx and fatigue lasted several days. At times feels he's slipping into a bit more depression with less motivation and isolating.  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  Patient reports stable mood and denies depressed or irritable moods.  Patient with residual anxiety which sometimes interferes with sleep.  Sonata helps prn.  2-3 days a week can be hard to shut off brain.  Occ EFA.Marland Kitchen Usually 7-8 hour of sleep.  OCc daytime tiredness. Denies appetite disturbance.  Patient reports that energy and motivation have been good.  Patient denies any difficulty with concentration.  Patient denies any suicidal ideation. Plan:  He wanted to try Rexulti 2 mg augmentation  05/14/20 appt with the following noted: Rexulti for 2 mos helped but cost $250/mo copay and couldn't afford.  Felt worse off of it. Anxiety out the roof with uncertain cause. Seems worse than ever.  Varying subjects for worry.  Can't seem to get a hold of it.  Wife notices he's distant for weeks. Rexulti helped significantly.   Plan: sampled Rexulti  07/11/2020 appointment with the following  noted: Been able to stay on Rexulti and is better on it.  Clear difference.  Anxiety is better and manageable.  Mild depression comes and goes and manageable. No SE. Sleep not great with trouble staying asleep.  EFA with variable times.   Plan: Clearly better with Rexulti 2 mg in addition to sertraline 200 mg daily. Recommend a trial of trazodone at 100 250 mg nightly for insomnia  09/21/2020 phone call from patient stating he could no longer afford Rexulti.  He had already failed Abilify.  He is likely to be unable to afford the other partial dopamine agonist Vraylar.  Therefore initiated off label trial of pramipexole to take the place of the Rexulti.  Patient has a pending appointment in 3 weeks.  10/11/2020 appointment with the following noted: Ran out of Rexulti bc cost with new year was going to be $1000.   Overall still doing fine and has been since last seen.  Not noticed any changes so far with switch from Rexulti to pramipexole. Sleep is better.  Awakens but back to sleep.  Depression and anxiety are manageable. Patient reports stable mood and denies depressed or irritable moods.  Patient denies any recent difficulty with anxiety.  Patient denies difficulty with sleep initiation or maintenance. Denies appetite disturbance.  Patient reports that energy and motivation have been good.  Patient denies any difficulty with concentration.  Patient denies any suicidal ideation. No SE Plan: Clearly better with Rexulti 2 mg in addition to sertraline 200 mg daily. So far has been OK  with 3 week change to pramipexole but he could still relaps bc of long half life of Rexulti. Switch for sleep to trazodone.  01/07/21 appt noted: Ok overall.  Noticed some things he just wrote off like finger twitching and now resting tremor on R hand and when does something it goes away.  Went to PCP and awaiting referral to neurologist.  Also had some balance issues for some months also and wonders about PD.    Depression is OK.  Caffeine as late as bedtime.  Coke Zero and diet Mtn Dew. Plan: No med changes  03/19/2021 appointment with the following noted: Reviewed neuro note 02/21/2021 Dr. Sherryll Burger who suspects Parkinson's disease and started carbidopa levodopa. Maybe less stiffness with Sinemet. Pretty good re: mental health since here.  Trazodone helps some with sleep. No SE Plan: Continue pramipexole 0.25 mg BID Sertraline 200 daily trazodone 100 -200 mg HS helps go asleep but doesn't stay asleep..   Switch to decaf by 3 PM.  Emphasized this  07/22/21 appt noted: Still pretty well.  Depression and anxiety pretty under control. Continue meds as above. No SE CO EFA/EMA. Awakens after 3 hours. Sometimes back to sleep.  Has OSA and on CPAP. Usually works daytime so little napping. Plan: Continue pramipexole 0.25 mg BID Sertraline 200 daily Switch trazodone bc doesn't stay asleep to hydroxyzine 25-50 mg HS.Marland Kitchen  Also gave RX Belsomra samples in case it failed. 10-20 Switch to decaf by 3 PM.  Emphasized this  He's done this  11/12/21 appt noted: Hydroxyzine works but hangover  and lingers through the day with 25 mg. Belsomra also hangover but did keep him asleep better. Sleep same ith decent effect initially with trazodone.  Sleep is not as good as needed and some irritability.   Mood pretty fair without major problems. Patient reports stable mood and denies depressed or irritable moods.  Patient denies any recent difficulty with anxiety other than mild background levels.  His wife and her family are close and create stress.   Denies appetite disturbance.  Patient reports that energy and motivation have been good.  Patient denies any difficulty with concentration.  Patient denies any suicidal ideation. Plan: Continue pramipexole 0.25 mg BID Sertraline 200 daily Switch trazodone bc doesn't stay asleep to hydroxyzine 25-50 mg HS..  Trial Quiviviq 50 mg HS Switch to decaf by 3 PM.  Emphasized this   He's done this  02/11/2022 appointment with the following noted: Can't get anything to help stay asleep.  Trazodone best so far bc without hangover. Not sure how much sleep.  Varies.  Last week 3 AM wide awake. Anxiety is higher than it was and more irritable without trigger.  Hard to shut off mind at night. Plan: Yes trial Restoril 15 mg 1-2 HS for EMA  04/14/22 appt noted: On temazepam  30 mg HS and off trazodone. On sertraline 200 mg  and pramipexole 0.25 mg BID and Sinemet. Still sleep px avg 3 hours.  Sleep terrible.  Trouble getting back to sleep. Back pain will awaken him and goes to LR and into chair.  Not always the cause.  No pain meds. Work up pending with hx fusion.  Also has nausea. 25th wedding anniversary and to [place] this week. Son 3 strokes as child from heart px as a kid. Plan: Yes trial clonazepam 0.5-1.0 mg HS Good to have switched DT recent dx PD.  Disc pramipexole in reference to dx PD.  Dr. Sherryll Burger ok with it's low  dose added on for off-label treatment of depression. Continue pramipexole 0.25 mg BID Sertraline 200 daily  07/01/2022 appointment noted: Continues above meds.  Also on Sinemet. Taking clonazepam 0.5 mg HS for sleep.  No hangover; no SE.  Still trouble staying asleep and EMA.  Really bad.  To bed 11 and up 230 for couple hours.  The next day not overy tired or sleep  4 hours at night without naps.Marland Kitchen  asks to increase clonazepam. Sometimes fuse a little shorter than should be but recognizes it and can manage it. Not sig depressed noor anxious Can tell PD is progressing somewhat.  Some dystonia at night with cramps at night.  Also more right tremor. Handling it with grace and information.  10/02/22 appt noted: Pretty good except still dealing with severe neck pain for a year.  Sees neuro for PD.  Tried lots of med and PT.  C spine MRI is abnormal.  First injection wihtout help.  Likely NS referral.  Pain interferes with sleep. Meds sertraline 200,  clonazepam 1.0 mg HS, pramipexole 0.25 BID with PD. Mood dysphoric over the chronic pain but otherwise satisfied with current meds. No SE with meds.     Past Psychiatric Medication Trials: Sertraline 200, venlafaxine 225, paroxetine Abilify 10, Rexulti 2,  Pramipexole  buspirone 30 twice daily, doxazosin 8,   Sonata ? effect, trazodone 200 EMA, hydroxyzine 25 helped a little. hangover, Belsomra hangover, Quvivik NR & hangover, [place]igo NR, temazepam 30 NR Temazepam NR Clonazepam 0.5 mg HS no SE min response  Review of Systems:  Review of Systems  Cardiovascular:  Negative for palpitations.  Gastrointestinal:  Negative for abdominal pain.  Genitourinary:  Positive for frequency.  Musculoskeletal:  Positive for back pain and neck pain.  Neurological:  Positive for tremors. Negative for dizziness, weakness and headaches.       Balance and some stiffness  Psychiatric/Behavioral:  Positive for sleep disturbance. The patient is not nervous/anxious.     Medications: I have reviewed the patient's current medications.  Current Outpatient Medications  Medication Sig Dispense Refill   carbidopa-levodopa (SINEMET IR) 25-100 MG tablet Take 1 tablet by mouth 3 (three) times daily.     clonazePAM (KLONOPIN) 0.5 MG tablet Take 1 and 1/2 to 2  tablets at night as needed for sleep 60 tablet 2   nortriptyline (PAMELOR) 10 MG capsule Take 1 capsule (10 mg total) by mouth at bedtime. 30 capsule 0   ondansetron (ZOFRAN-ODT) 4 MG disintegrating tablet Take 1 tablet (4 mg total) by mouth every 6 (six) hours as needed for nausea or vomiting. 20 tablet 0   pramipexole (MIRAPEX) 0.25 MG tablet TAKE ONE TABLET BY MOUTH TWICE DAILY 180 tablet 0   rosuvastatin (CRESTOR) 20 MG tablet Take 20 mg by mouth daily.     sertraline (ZOLOFT) 100 MG tablet TAKE 2 TABLETS BY MOUTH DAILY 180 tablet 0   dicyclomine (BENTYL) 20 MG tablet Take 1 tablet (20 mg total) by mouth 4 (four) times daily -  before meals and at bedtime  for 10 days. 40 tablet 0   No current facility-administered medications for this visit.    Medication Side Effects: None  Allergies:  Allergies  Allergen Reactions   Adhesive [Tape] Other (See Comments)    Blisters    Codeine Nausea And Vomiting   Latex Other (See Comments)    blisters    Past Medical History:  Diagnosis Date   Anxiety    Anxiety    Arthritis  Depression    Hyperlipidemia    Insomnia    Lymphadenopathy, retroperitoneal    OSA (obstructive sleep apnea)    Parkinson disease    Sciatica    Seizures ([place])    Spinal stenosis    Spinal stenosis    Spondylisthesis     History reviewed. No pertinent family history.  Social History   Socioeconomic History   Marital status: Married    Spouse name: Not on file   Number of children: Not on file   Years of education: Not on file   Highest education level: Not on file  Occupational History   Not on file  Tobacco Use   Smoking status: Never   Smokeless tobacco: Never  Vaping Use   Vaping Use: Never used  Substance and Sexual Activity   Alcohol use: Yes    Alcohol/week: 2.0 standard drinks of alcohol    Types: 2 Cans of beer per week    Comment: NONE LAST 24 HRS   Drug use: No   Sexual activity: Yes  Other Topics Concern   Not on file  Social History Narrative   Not on file   Social Determinants of Health   Financial Resource Strain: Not on file  Food Insecurity: Not on file  Transportation Needs: Not on file  Physical Activity: Not on file  Stress: Not on file  Social Connections: Not on file  Intimate Partner Violence: Not on file    Past Medical History, Surgical history, Social history, and Family history were reviewed and updated as appropriate.   Please see review of systems for further details on the patient's review from today.   Objective:   Physical Exam:  There were no vitals taken for this visit.  Physical Exam Constitutional:      General: He is not in acute  distress.    Appearance: He is well-developed. He is obese.  Musculoskeletal:        General: No deformity.  Neurological:     Mental Status: He is alert and oriented to person, place, and time.     Coordination: Coordination normal.  Psychiatric:        Attention and Perception: Attention and perception normal. He does not perceive auditory or visual hallucinations.        Mood and Affect: Mood is anxious. Mood is not depressed. Affect is not labile, angry, tearful or inappropriate.        Speech: Speech normal.        Behavior: Behavior normal.        Thought Content: Thought content normal. Thought content is not delusional. Thought content does not include homicidal or suicidal ideation. Thought content does not include suicidal plan.        Cognition and Memory: Cognition and memory normal.        Judgment: Judgment normal.     Comments: Insight intact. No delusions.  Dysphoric with pain talkative     Lab Review:     Component Value Date/Time   NA 136 08/31/2022 2334   K 3.4 (L) 08/31/2022 2334   CL 101 08/31/2022 2334   CO2 26 08/31/2022 2334   GLUCOSE 106 (H) 08/31/2022 2334   BUN 12 08/31/2022 2334   CREATININE 1.07 08/31/2022 2334   CALCIUM 8.9 08/31/2022 2334   PROT 7.4 08/31/2022 2334   ALBUMIN 4.3 08/31/2022 2334   AST 23 08/31/2022 2334   ALT 6 08/31/2022 2334   ALKPHOS 65 08/31/2022 2334   BILITOT 1.0  08/31/2022 2334   GFRNONAA >60 08/31/2022 2334       Component Value Date/Time   WBC 6.8 08/31/2022 2334   RBC 4.30 08/31/2022 2334   HGB 13.0 08/31/2022 2334   HCT 39.5 08/31/2022 2334   PLT 168 08/31/2022 2334   MCV 91.9 08/31/2022 2334   MCH 30.2 08/31/2022 2334   MCHC 32.9 08/31/2022 2334   RDW 13.0 08/31/2022 2334   LYMPHSABS 0.6 (L) 08/31/2022 2334   MONOABS 0.7 08/31/2022 2334   EOSABS 0.2 08/31/2022 2334   BASOSABS 0.1 08/31/2022 2334    No results found for: "POCLITH", "LITHIUM"   No results found for: "PHENYTOIN", "PHENOBARB",  "VALPROATE", "CBMZ"   .res Assessment: Plan:    Dakota Davis was seen today for follow-up, depression, anxiety and post-traumatic stress disorder.  Diagnoses and all orders for this visit:  Depression, major, recurrent, in partial remission ([place])  Insomnia due to mental condition  Generalized anxiety disorder  PTSD (post-traumatic stress disorder)  Neck pain -     nortriptyline (PAMELOR) 10 MG capsule; Take 1 capsule (10 mg total) by mouth at bedtime.   Greater than 50% of 30 min face to face time with patient was spent on counseling and coordination of care. We discussed  ongoing sleep problems  triggering irritability.    Discussed potential metabolic side effects associated with atypical antipsychotics, as well as potential risk for movement side effects. Advised pt to contact office if movement side effects occur.  responded to Rexulti 2 mg with resolution of sx. Had to switch to pramipexole for cost reasons. Still doing oK.  Rexulti worked better.  We discussed the short-term risks associated with benzodiazepines including sedation and increased fall risk among others.  Discussed long-term side effect risk including dependence, potential withdrawal symptoms, and the potential eventual dose-related risk of dementia.  But recent studies from 2020 dispute this association between benzodiazepines and dementia risk. Newer studies in 2020 do not support an association with dementia. Disc Bz risk with sleep bc anxiety with it and doesn't feel well and on max SSRI.  Disc pros and cons given failure of non-BZ for sleep.   Is also more irritable. clonazepam 0.5-1.0 mg HS  Good to have switched DT recent dx PD.  Disc pramipexole in reference to dx PD.  Dr. Manuella Ghazi ok with it's low dose added on for off-label treatment of depression. Continue pramipexole 0.25 mg BID Sertraline 200 daily  decaf by 3 PM and He's done this   Disc SE in detail and SSRI withdrawal sx.  Option low dose TCA for  chronic neck pain or switch to SNRI.   Disc option nortriptyline or amitriptyline low dose for chronic neck pain until sees neurosurgery.   Yes Nortriptyline 10 mg HS for neck pain.  Call if need higher dose.  Supportive therapy dealing with   FU 2-3 mos.  Lynder Parents, MD, DFAPA    Please see After Visit Summary for patient specific instructions.  No future appointments.   No orders of the defined types were placed in this encounter.    -------------------------------

## 2022-10-06 ENCOUNTER — Other Ambulatory Visit: Payer: Self-pay | Admitting: Psychiatry

## 2022-10-06 DIAGNOSIS — F5105 Insomnia due to other mental disorder: Secondary | ICD-10-CM

## 2022-11-18 ENCOUNTER — Other Ambulatory Visit: Payer: Self-pay

## 2022-11-18 DIAGNOSIS — F3341 Major depressive disorder, recurrent, in partial remission: Secondary | ICD-10-CM

## 2022-11-18 MED ORDER — PRAMIPEXOLE DIHYDROCHLORIDE 0.25 MG PO TABS: 0.2500 mg | ORAL_TABLET | Freq: Two times a day (BID) | ORAL | 0 refills | Status: DC

## 2022-12-06 ENCOUNTER — Other Ambulatory Visit: Payer: Self-pay | Admitting: Psychiatry

## 2022-12-06 DIAGNOSIS — F411 Generalized anxiety disorder: Secondary | ICD-10-CM

## 2022-12-06 DIAGNOSIS — F4310 Post-traumatic stress disorder, unspecified: Secondary | ICD-10-CM

## 2022-12-06 DIAGNOSIS — F3341 Major depressive disorder, recurrent, in partial remission: Secondary | ICD-10-CM

## 2023-01-08 ENCOUNTER — Other Ambulatory Visit: Payer: Self-pay | Admitting: Neurological Surgery

## 2023-01-08 DIAGNOSIS — M5010 Cervical disc disorder with radiculopathy, unspecified cervical region: Secondary | ICD-10-CM

## 2023-01-12 ENCOUNTER — Telehealth: Payer: Self-pay | Admitting: Psychiatry

## 2023-01-12 NOTE — Telephone Encounter (Signed)
CMM sent PA request for Rexulti 2mg  tabls Key #[id number]

## 2023-01-26 ENCOUNTER — Ambulatory Visit
Admission: RE | Admit: 2023-01-26 | Discharge: 2023-01-26 | Disposition: A | Discharge status: Home / Self Care | Payer: BC Managed Care – PPO | Source: Ambulatory Visit | Attending: Neurological Surgery | Admitting: Neurological Surgery

## 2023-01-26 DIAGNOSIS — M5010 Cervical disc disorder with radiculopathy, unspecified cervical region: Secondary | ICD-10-CM

## 2023-01-26 MED ORDER — ONDANSETRON HCL 4 MG/2ML IJ SOLN: 4.0000 mg | Freq: Once | INTRAMUSCULAR | Status: DC | PRN

## 2023-01-26 MED ORDER — DIAZEPAM 5 MG PO TABS
10.0000 mg | ORAL_TABLET | Freq: Once | ORAL | Status: AC
  Administered 2023-01-26: 10 mg via ORAL

## 2023-01-26 MED ORDER — IOPAMIDOL (ISOVUE-M 300) INJECTION 61%
10.0000 mL | Freq: Once | INTRAMUSCULAR | Status: AC
  Administered 2023-01-26: 10 mL via INTRATHECAL

## 2023-01-26 MED ORDER — MEPERIDINE HCL 50 MG/ML IJ SOLN: 50.0000 mg | Freq: Once | INTRAMUSCULAR | Status: DC | PRN

## 2023-01-26 NOTE — Discharge Instructions (Signed)

## 2023-01-29 ENCOUNTER — Ambulatory Visit: Payer: BC Managed Care – PPO | Admitting: Psychiatry

## 2023-01-30 NOTE — Telephone Encounter (Signed)
He indicated he was clearly better on Rexulti 2 mg once daily than the current combination which is pramipexole with sertraline.  He was better when on sertraline plus Rexulti 2 mg.  However he said he could not afford the Rexulti.  I am not sure where the current PA for Rexulti came from given I do not think he has a current prescription for Rexulti?  But if it is possible that he can get the Rexulti at a reasonable price now using the discount card with the PA approval then we should pursue that.  Please call him and tell him we may be able to get the Rexulti at a reasonable cost now that the PA is approved and if he uses the discount card.  Ask if he wants to stop the pramipexole and return to Rexulti 2 mg.  If so send in prescription for Rexulti 2 mg 1 daily #30 , 1 refill.

## 2023-01-30 NOTE — Telephone Encounter (Signed)
Dakota Davis- can you contact pharmacy and make sure they have a savings card for him to check cost. Pt told Dr. Jennelle Human it was too expensive but I just got approval on his PA for Rexulti 2 mg.

## 2023-01-30 NOTE — Telephone Encounter (Signed)
Approval received effective through 01/27/2026 for REXULTI 2 MG with [employer]

## 2023-01-30 NOTE — Telephone Encounter (Signed)
Patient doesn't have a current Rx for Rexulti, was last seen 09/2022.

## 2023-02-04 MED ORDER — BREXPIPRAZOLE 2 MG PO TABS: 2.0000 mg | ORAL_TABLET | Freq: Every day | ORAL | 1 refills | Status: DC

## 2023-02-04 NOTE — Addendum Note (Signed)
Addended by: Karin Lieu T on: 02/04/2023 11:51 AM   Modules accepted: Orders

## 2023-02-04 NOTE — Telephone Encounter (Signed)
Cost is $0. Patient notified.

## 2023-02-04 NOTE — Telephone Encounter (Signed)
Patient would like to go back to Rexulti provided it is affordable. PA has been approved. Rx sent. Will check with pharmacy later today and provide co-pay info and let patient know cost.

## 2023-02-04 NOTE — Telephone Encounter (Signed)
He was told to stop pramipexole.

## 2023-03-03 ENCOUNTER — Encounter: Payer: Self-pay | Admitting: Psychiatry

## 2023-03-03 ENCOUNTER — Ambulatory Visit (INDEPENDENT_AMBULATORY_CARE_PROVIDER_SITE_OTHER): Payer: BC Managed Care – PPO | Admitting: Psychiatry

## 2023-03-03 DIAGNOSIS — F4310 Post-traumatic stress disorder, unspecified: Secondary | ICD-10-CM | POA: Diagnosis not present

## 2023-03-03 DIAGNOSIS — M542 Cervicalgia: Secondary | ICD-10-CM | POA: Diagnosis not present

## 2023-03-03 DIAGNOSIS — F3341 Major depressive disorder, recurrent, in partial remission: Secondary | ICD-10-CM | POA: Diagnosis not present

## 2023-03-03 DIAGNOSIS — F411 Generalized anxiety disorder: Secondary | ICD-10-CM | POA: Diagnosis not present

## 2023-03-03 DIAGNOSIS — F5105 Insomnia due to other mental disorder: Secondary | ICD-10-CM

## 2023-03-03 DIAGNOSIS — G20C Parkinsonism, unspecified: Secondary | ICD-10-CM

## 2023-03-03 MED ORDER — CLONAZEPAM 1 MG PO TABS
1.5000 mg | ORAL_TABLET | Freq: Every day | ORAL | 1 refills | Status: DC
Start: 2023-03-03 — End: 2023-05-05

## 2023-03-03 MED ORDER — DULOXETINE HCL 30 MG PO CPEP
ORAL_CAPSULE | ORAL | 1 refills | Status: DC
Start: 2023-03-03 — End: 2023-04-10

## 2023-03-03 NOTE — Patient Instructions (Addendum)
Stop nortriptyline. Reduce sertraline to 1 and 1/2 tablets daily and start duloxetine 1 daily for 5-7 days, Then reduce sertraline to 1 daily and increase duloxetine to 2 daily for 5/7 days, Then reduce sertraline 1/2 daily and increase duloxetine 3 daily for 5-7 days, Then stop sertraline.  If it helps then stop the Rexulti.

## 2023-03-03 NOTE — Progress Notes (Signed)
Dakota Davis [id number] 1965-09-21 57 y.o.  Subjective:   Patient ID:  Dakota Davis is a 57 y.o. (DOB [birth year]) male.  Chief Complaint:  Chief Complaint  Patient presents with   Follow-up    Depression        Associated symptoms include no headaches.  Past medical history includes anxiety.   Anxiety Patient reports no dizziness, nervous/anxious behavior or palpitations.     Dakota Davis presents to the office today for follow-up of generalized anxiety disorder with elements of PTSD and major depression.  seen October 2020.  No meds were changed.  He remained on sertraline 200, aripiprazole 10 mg and Sonata or trazodone as needed insomnia.  January 11, 2020 appointment the following is noted: Had Covid Feb with flu-like sx and fatigue lasted several days. At times feels he's slipping into a bit more depression with less motivation and isolating.  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  Patient reports stable mood and denies depressed or irritable moods.  Patient with residual anxiety which sometimes interferes with sleep.  Sonata helps prn.  2-3 days a week can be hard to shut off brain.  Occ EFA.Marland Kitchen Usually 7-8 hour of sleep.  OCc daytime tiredness. Denies appetite disturbance.  Patient reports that energy and motivation have been good.  Patient denies any difficulty with concentration.  Patient denies any suicidal ideation. Plan:  He wanted to try Rexulti 2 mg augmentation  05/14/20 appt with the following noted: Rexulti for 2 mos helped but cost $250/mo copay and couldn't afford.  Felt worse off of it. Anxiety out the roof with uncertain cause. Seems worse than ever.  Varying subjects for worry.  Can't seem to get a hold of it.  Wife notices he's distant for weeks. Rexulti helped significantly.   Plan: sampled Rexulti  07/11/2020 appointment with the following noted: Been able to stay on Rexulti and is better on it.  Clear  difference.  Anxiety is better and manageable.  Mild depression comes and goes and manageable. No SE. Sleep not great with trouble staying asleep.  EFA with variable times.   Plan: Clearly better with Rexulti 2 mg in addition to sertraline 200 mg daily. Recommend a trial of trazodone at 100 250 mg nightly for insomnia  09/21/2020 phone call from patient stating he could no longer afford Rexulti.  He had already failed Abilify.  He is likely to be unable to afford the other partial dopamine agonist Vraylar.  Therefore initiated off label trial of pramipexole to take the place of the Rexulti.  Patient has a pending appointment in 3 weeks.  10/11/2020 appointment with the following noted: Ran out of Rexulti bc cost with new year was going to be $1000.   Overall still doing fine and has been since last seen.  Not noticed any changes so far with switch from Rexulti to pramipexole. Sleep is better.  Awakens but back to sleep.  Depression and anxiety are manageable. Patient reports stable mood and denies depressed or irritable moods.  Patient denies any recent difficulty with anxiety.  Patient denies difficulty with sleep initiation or maintenance. Denies appetite disturbance.  Patient reports that energy and motivation have been good.  Patient denies any difficulty with concentration.  Patient denies any suicidal ideation. No SE Plan: Clearly better with Rexulti 2 mg in addition to sertraline 200 mg daily. So far has been OK with 3 week change to pramipexole but he could still relaps  bc of long half life of Rexulti. Switch for sleep to trazodone.  01/07/21 appt noted: Ok overall.  Noticed some things he just wrote off like finger twitching and now resting tremor on R hand and when does something it goes away.  Went to PCP and awaiting referral to neurologist.  Also had some balance issues for some months also and wonders about PD.   Depression is OK.  Caffeine as late as bedtime.  Coke Zero and diet Mtn  Dew. Plan: No med changes  03/19/2021 appointment with the following noted: Reviewed neuro note 02/21/2021 Dr. Sherryll Burger who suspects Parkinson's disease and started carbidopa levodopa. Maybe less stiffness with Sinemet. Pretty good re: mental health since here.  Trazodone helps some with sleep. No SE Plan: Continue pramipexole 0.25 mg BID Sertraline 200 daily trazodone 100 -200 mg HS helps go asleep but doesn't stay asleep..   Switch to decaf by 3 PM.  Emphasized this  07/22/21 appt noted: Still pretty well.  Depression and anxiety pretty under control. Continue meds as above. No SE CO EFA/EMA. Awakens after 3 hours. Sometimes back to sleep.  Has OSA and on CPAP. Usually works daytime so little napping. Plan: Continue pramipexole 0.25 mg BID Sertraline 200 daily Switch trazodone bc doesn't stay asleep to hydroxyzine 25-50 mg HS.Marland Kitchen  Also gave RX Belsomra samples in case it failed. 10-20 Switch to decaf by 3 PM.  Emphasized this  He's done this  11/12/21 appt noted: Hydroxyzine works but hangover  and lingers through the day with 25 mg. Belsomra also hangover but did keep him asleep better. Sleep same ith decent effect initially with trazodone.  Sleep is not as good as needed and some irritability.   Mood pretty fair without major problems. Patient reports stable mood and denies depressed or irritable moods.  Patient denies any recent difficulty with anxiety other than mild background levels.  His wife and her family are close and create stress.   Denies appetite disturbance.  Patient reports that energy and motivation have been good.  Patient denies any difficulty with concentration.  Patient denies any suicidal ideation. Plan: Continue pramipexole 0.25 mg BID Sertraline 200 daily Switch trazodone bc doesn't stay asleep to hydroxyzine 25-50 mg HS..  Trial Quiviviq 50 mg HS Switch to decaf by 3 PM.  Emphasized this  He's done this  02/11/2022 appointment with the following noted: Can't get  anything to help stay asleep.  Trazodone best so far bc without hangover. Not sure how much sleep.  Varies.  Last week 3 AM wide awake. Anxiety is higher than it was and more irritable without trigger.  Hard to shut off mind at night. Plan: Yes trial Restoril 15 mg 1-2 HS for EMA  04/14/22 appt noted: On temazepam  30 mg HS and off trazodone. On sertraline 200 mg  and pramipexole 0.25 mg BID and Sinemet. Still sleep px avg 3 hours.  Sleep terrible.  Trouble getting back to sleep. Back pain will awaken him and goes to LR and into chair.  Not always the cause.  No pain meds. Work up pending with hx fusion.  Also has nausea. 25th wedding anniversary and to [place] this week. Son 3 strokes as child from heart px as a kid. Plan: Yes trial clonazepam 0.5-1.0 mg HS Good to have switched DT recent dx PD.  Disc pramipexole in reference to dx PD.  Dr. Sherryll Burger ok with it's low dose added on for off-label treatment of depression. Continue pramipexole 0.25  mg BID Sertraline 200 daily  07/01/2022 appointment noted: Continues above meds.  Also on Sinemet. Taking clonazepam 0.5 mg HS for sleep.  No hangover; no SE.  Still trouble staying asleep and EMA.  Really bad.  To bed 11 and up 230 for couple hours.  The next day not overy tired or sleep  4 hours at night without naps.Marland Kitchen  asks to increase clonazepam. Sometimes fuse a little shorter than should be but recognizes it and can manage it. Not sig depressed noor anxious Can tell PD is progressing somewhat.  Some dystonia at night with cramps at night.  Also more right tremor. Handling it with grace and information.  10/02/22 appt noted: Pretty good except still dealing with severe neck pain for a year.  Sees neuro for PD.  Tried lots of med and PT.  C spine MRI is abnormal.  First injection wihtout help.  Likely NS referral.  Pain interferes with sleep. Meds sertraline 200, clonazepam 1.0 mg HS, pramipexole 0.25 BID with PD. Mood dysphoric over the  chronic pain but otherwise satisfied with current meds. No SE with meds.   Plan: Yes Nortriptyline 10 mg HS for neck pain.    03/03/23 appt noted: 3/4 Cervical fusion and didn't relieve pain.  And may need surg again on higher level.   Meds as above and nortriptyline 10 HS, and Rexulti 2 mg daily added. Ongoing neck pain and work up.   Knee pain too.  Hx a number of ortho surgeries.   Mood is more stable with Rexulti.   Sometimes too short tempered and knows he is doing it.  No outbursts of anger but can have time periods of agitation and sometimes without a reason.  Wife will notice it.  Probably for a couple of mos.   PD sx are generally mild.   Still some sleep benefit with clonazepam 1 mg HS but not as much as before. Avg 5 hour sleep, 7 more typical.  Pain can interfere.  Not always pain.   Not napping during the day. Asks about Cymbalta  Past Psychiatric Medication Trials: Sertraline 200, venlafaxine 225, paroxetine Abilify 10, Rexulti 2,  Pramipexole  buspirone 30 twice daily, doxazosin 8,   Sonata ? effect, trazodone 200 EMA, hydroxyzine 25 helped a little. hangover, Belsomra hangover, Quvivik NR & hangover, Davigo NR, temazepam 30 NR Temazepam NR Clonazepam 0.5 mg HS no SE min response  Review of Systems:  Review of Systems  Cardiovascular:  Negative for palpitations.  Gastrointestinal:  Negative for abdominal pain.  Genitourinary:  Positive for frequency.  Musculoskeletal:  Positive for back pain and neck pain.  Neurological:  Positive for tremors. Negative for dizziness, weakness and headaches.       Balance and some stiffness  Psychiatric/Behavioral:  Positive for sleep disturbance. The patient is not nervous/anxious.     Medications: I have reviewed the patient's current medications.  Current Outpatient Medications  Medication Sig Dispense Refill   brexpiprazole (REXULTI) 2 MG TABS tablet Take 1 tablet (2 mg total) by mouth daily. 30 tablet 1   carbidopa-levodopa  (SINEMET IR) 25-100 MG tablet Take 1 tablet by mouth 3 (three) times daily.     clonazePAM (KLONOPIN) 0.5 MG tablet TAKE ONE AND ONE-HALF TO TWO TABLETS AT NIGHT AS NEEDED FOR SLEEP 60 tablet 5   DULoxetine (CYMBALTA) 30 MG capsule 1 daily for 5 days then 1 daily for 5 days then 3 daily 90 capsule 1   gabapentin (NEURONTIN) 300 MG capsule  Take 600 mg by mouth 3 (three) times daily.     ondansetron (ZOFRAN-ODT) 4 MG disintegrating tablet Take 1 tablet (4 mg total) by mouth every 6 (six) hours as needed for nausea or vomiting. 20 tablet 0   pramipexole (MIRAPEX) 0.25 MG tablet Take 1 tablet (0.25 mg total) by mouth 2 (two) times daily. 180 tablet 0   rosuvastatin (CRESTOR) 20 MG tablet Take 20 mg by mouth daily.     dicyclomine (BENTYL) 20 MG tablet Take 1 tablet (20 mg total) by mouth 4 (four) times daily -  before meals and at bedtime for 10 days. 40 tablet 0   No current facility-administered medications for this visit.    Medication Side Effects: None  Allergies:  Allergies  Allergen Reactions   Adhesive [Tape] Other (See Comments)    Blisters    Codeine Nausea And Vomiting   Latex Other (See Comments)    blisters    Past Medical History:  Diagnosis Date   Anxiety    Anxiety    Arthritis    Depression    Hyperlipidemia    Insomnia    Lymphadenopathy, retroperitoneal    OSA (obstructive sleep apnea)    Parkinson disease    Sciatica    Seizures (HCC)    Spinal stenosis    Spinal stenosis    Spondylisthesis     History reviewed. No pertinent family history.  Social History   Socioeconomic History   Marital status: Married    Spouse name: Not on file   Number of children: Not on file   Years of education: Not on file   Highest education level: Not on file  Occupational History   Not on file  Tobacco Use   Smoking status: Never   Smokeless tobacco: Never  Vaping Use   Vaping Use: Never used  Substance and Sexual Activity   Alcohol use: Yes    Alcohol/week:  2.0 standard drinks of alcohol    Types: 2 Cans of beer per week    Comment: NONE LAST 24 HRS   Drug use: No   Sexual activity: Yes  Other Topics Concern   Not on file  Social History Narrative   Not on file   Social Determinants of Health   Financial Resource Strain: Not on file  Food Insecurity: Not on file  Transportation Needs: Not on file  Physical Activity: Not on file  Stress: Not on file  Social Connections: Not on file  Intimate Partner Violence: Not on file    Past Medical History, Surgical history, Social history, and Family history were reviewed and updated as appropriate.   Please see review of systems for further details on the patient's review from today.   Objective:   Physical Exam:  There were no vitals taken for this visit.  Physical Exam Constitutional:      General: He is not in acute distress.    Appearance: He is well-developed. He is obese.  Musculoskeletal:        General: No deformity.  Neurological:     Mental Status: He is alert and oriented to person, place, and time.     Coordination: Coordination normal.  Psychiatric:        Attention and Perception: Attention and perception normal. He does not perceive auditory or visual hallucinations.        Mood and Affect: Mood is anxious. Mood is not depressed. Affect is not labile, angry, tearful or inappropriate.  Speech: Speech normal.        Behavior: Behavior normal.        Thought Content: Thought content normal. Thought content is not delusional. Thought content does not include homicidal or suicidal ideation. Thought content does not include suicidal plan.        Cognition and Memory: Cognition and memory normal.        Judgment: Judgment normal.     Comments: Insight intact. No delusions.  Dysphoric with pain talkative     Lab Review:     Component Value Date/Time   NA 136 08/31/2022 2334   K 3.4 (L) 08/31/2022 2334   CL 101 08/31/2022 2334   CO2 26 08/31/2022 2334    GLUCOSE 106 (H) 08/31/2022 2334   BUN 12 08/31/2022 2334   CREATININE 1.07 08/31/2022 2334   CALCIUM 8.9 08/31/2022 2334   PROT 7.4 08/31/2022 2334   ALBUMIN 4.3 08/31/2022 2334   AST 23 08/31/2022 2334   ALT 6 08/31/2022 2334   ALKPHOS 65 08/31/2022 2334   BILITOT 1.0 08/31/2022 2334   GFRNONAA >60 08/31/2022 2334       Component Value Date/Time   WBC 6.8 08/31/2022 2334   RBC 4.30 08/31/2022 2334   HGB 13.0 08/31/2022 2334   HCT 39.5 08/31/2022 2334   PLT 168 08/31/2022 2334   MCV 91.9 08/31/2022 2334   MCH 30.2 08/31/2022 2334   MCHC 32.9 08/31/2022 2334   RDW 13.0 08/31/2022 2334   LYMPHSABS 0.6 (L) 08/31/2022 2334   MONOABS 0.7 08/31/2022 2334   EOSABS 0.2 08/31/2022 2334   BASOSABS 0.1 08/31/2022 2334    No results found for: "POCLITH", "LITHIUM"   No results found for: "PHENYTOIN", "PHENOBARB", "VALPROATE", "CBMZ"   .res Assessment: Plan:    Patricik was seen today for follow-up.  Diagnoses and all orders for this visit:  Depression, major, recurrent, in partial remission (HCC) -     DULoxetine (CYMBALTA) 30 MG capsule; 1 daily for 5 days then 1 daily for 5 days then 3 daily  Generalized anxiety disorder -     DULoxetine (CYMBALTA) 30 MG capsule; 1 daily for 5 days then 1 daily for 5 days then 3 daily  PTSD (post-traumatic stress disorder) -     DULoxetine (CYMBALTA) 30 MG capsule; 1 daily for 5 days then 1 daily for 5 days then 3 daily  Neck pain -     DULoxetine (CYMBALTA) 30 MG capsule; 1 daily for 5 days then 1 daily for 5 days then 3 daily  Insomnia due to mental condition  Primary parkinsonism    30 min face to face time with patient was spent on counseling and coordination of care.   Discussed potential metabolic side effects associated with atypical antipsychotics, as well as potential risk for movement side effects. Advised pt to contact office if movement side effects occur.  responded to Rexulti 2 mg with resolution of sx. Had to switch to  pramipexole for cost reasonsbut now able to get Rexulti which  worked better and he's been able to get it again and pleased with it.   Overall mood is more stable with Rexulti 2 mg and wants to continue it..  We discussed the short-term risks associated with benzodiazepines including sedation and increased fall risk among others.  Discussed long-term side effect risk including dependence, potential withdrawal symptoms, and the potential eventual dose-related risk of dementia.  But recent studies from 2020 dispute this association between benzodiazepines and dementia risk. Newer  studies in 2020 do not support an association with dementia. Disc Bz risk with sleep bc anxiety with it and doesn't feel well and on max SSRI.  Disc pros and cons given failure of non-BZ for sleep.   Increase clonazepam 1.5 mg HS but after a week if not better then reduce to 1 mg HS.  Good to have switched DT recent dx PD.  Disc pramipexole in reference to dx PD.  Dr. Sherryll Burger ok with it's low dose added on for off-label treatment of depression. Continue pramipexole 0.25 mg BID  decaf by 3 PM and He's done this   Disc SE in detail and SSRI withdrawal sx.  DC nortriptyline bc not helpful and polypharmacy. Plan: Stop nortriptyline. Reduce sertraline to 1 and 1/2 tablets daily and start duloxetine 1 daily for 5-7 days, Then reduce sertraline to 1 daily and increase duloxetine to 2 daily for 5/7 days, Then reduce sertraline 1/2 daily and increase duloxetine 3 daily for 5-7 days, Then stop sertraline. If it helps then stop the Rexulti.  Supportive therapy dealing with neck  pain.    FU 2-3 mos.  Meredith Staggers, MD, DFAPA    Please see After Visit Summary for patient specific instructions.  No future appointments.   No orders of the defined types were placed in this encounter.    -------------------------------

## 2023-04-10 ENCOUNTER — Other Ambulatory Visit: Payer: Self-pay | Admitting: Psychiatry

## 2023-04-10 DIAGNOSIS — F411 Generalized anxiety disorder: Secondary | ICD-10-CM

## 2023-04-10 DIAGNOSIS — F4310 Post-traumatic stress disorder, unspecified: Secondary | ICD-10-CM

## 2023-04-10 DIAGNOSIS — F3341 Major depressive disorder, recurrent, in partial remission: Secondary | ICD-10-CM

## 2023-04-10 DIAGNOSIS — M542 Cervicalgia: Secondary | ICD-10-CM

## 2023-04-14 NOTE — Therapy (Signed)
OUTPATIENT PHYSICAL THERAPY NEURO EVALUATION   Patient Name: Dakota Davis MRN: [id number] DOB:07/02/1966, 57 y.o., male Today's Date: 04/15/2023   PCP: Lauro Regulus  REFERRING PROVIDER: Lonell Face MD   END OF SESSION:  PT End of Session - 04/15/23 1615     Visit Number 1    Number of Visits 24    Date for PT Re-Evaluation 07/08/23    PT Start Time 1615    PT Stop Time 1700    PT Time Calculation (min) 45 min    Equipment Utilized During Treatment Gait belt    Activity Tolerance Patient tolerated treatment well    Behavior During Therapy WFL for tasks assessed/performed             Past Medical History:  Diagnosis Date   Anxiety    Anxiety    Arthritis    Depression    Hyperlipidemia    Insomnia    Lymphadenopathy, retroperitoneal    OSA (obstructive sleep apnea)    Parkinson disease    Sciatica    Seizures (HCC)    Spinal stenosis    Spinal stenosis    Spondylisthesis    Past Surgical History:  Procedure Laterality Date   BACK SURGERY     BACK SURGERY     10/31/2016,12/21/2015   CARDIAC CATHETERIZATION     ESOPHAGOGASTRODUODENOSCOPY N/A 11/27/2021   Procedure: ESOPHAGOGASTRODUODENOSCOPY (EGD);  Surgeon: Toledo, Boykin Nearing, MD;  Location: [facility];  Service: Gastroenterology;  Laterality: N/A;   KNEE ARTHROSCOPY     2018   LEFT HEART CATH AND CORONARY ANGIOGRAPHY N/A 08/05/2017   Procedure: LEFT HEART CATH AND CORONARY ANGIOGRAPHY;  Surgeon: Lamar Blinks, MD;  Location: [facility];  Service: Cardiovascular;  Laterality: N/A;   TONSILECTOMY/ADENOIDECTOMY WITH MYRINGOTOMY     Patient Active Problem List   Diagnosis Date Noted   GAD (generalized anxiety disorder) 07/04/2018   PTSD (post-traumatic stress disorder) 07/04/2018   Stable angina 08/03/2017    ONSET DATE: 2022  REFERRING DIAG: Parkinson's Disease  THERAPY DIAG:  Difficulty in walking, not elsewhere classified  Unsteadiness on feet  Rationale for  Evaluation and Treatment: Rehabilitation  SUBJECTIVE:                                                                                                                                                                                             SUBJECTIVE STATEMENT: Patient presents to PT with diagnosis of Parkinson's Disease.  Pt accompanied by: self  PERTINENT HISTORY:   Patient presents to PT for evaluation of Parkinson's Disease. He has additional L shoulder pain  s/p ACDF 11/2022. His Parkinson's has L sided resting tremor with some bilateral tremors. He has limited R knee ROM at baseline. PMH includes Depression, anxiety, arthritis, bilateral sciatica, insomnia, obesity, OSA, seizures, spinal stenosis, spondylolisthesis at L4-5, PTSD, and lymphadenopathy retroperitoneal. Patient reports primary issue on L side, difficulty picking L foot up, drop foot of L foot and heel, stumbles but no falls. Pivots are challenging. Has Dystonia with L foot, starting in R side in past two weeks. 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  PAIN:  Are you having pain?  Dystonia of L foot   PRECAUTIONS: Fall   WEIGHT BEARING RESTRICTIONS: No  FALLS: Has patient fallen in last 6 months?  Stumbles but no falls to ground.   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 Prior C4-C6 ACDF without evidence of hardware complication. No residual high-grade stenosis. 2. Unchanged multilevel cervical spondylosis as described above. Unchanged moderate to severe right neuroforaminal stenosis  at C3-C4. Unchanged mild left neuroforaminal stenosis at C2-C3 and C3-C4.   COGNITION: Overall cognitive status: Within functional limits for tasks assessed   SENSATION: WFL  COORDINATION: Heel slide: L slower Finger to nose test: L dysmetria and slower speed   POSTURE: rounded shoulders, increased thoracic kyphosis, and weight shift right   LOWER EXTREMITY MMT:    MMT Right Eval Left Eval  Hip flexion 5 4  Hip abduction 4 4-  Hip adduction 5 4  Knee flexion 5 4-  Knee extension 5 4-  Ankle dorsiflexion 5 4  Ankle plantarflexion 5 4  (Blank rows = not tested)    TRANSFERS: Assistive device utilized: None  Sit to stand: SBA Stand to sit: SBA   STAIRS: Level of Assistance: SBA Stair Negotiation Technique: Alternating Pattern  with Single Rail on Right Number of Stairs: 4  Height of Stairs: 6 inch  Comments: decreased clearance of LLE  GAIT: Gait pattern:    decreased L ankle DF, R trunk lean during R stance, limited L arm swing, narrow BOS, B decreased step length Distance walked: 1050' Assistive device utilized: None Level of assistance: CGA Comments: see above; L foot collapse with weightbearing.   FUNCTIONAL TESTS:  5 times sit to stand: 14.7 6 minute walk test: 1025 Functional gait assessment: 17/30  PATIENT SURVEYS:  FOTO 60  TODAY'S TREATMENT:                                                                                                                              DATE: 04/15/23   Eval only   PATIENT  EDUCATION: Education details: goals, POC Person educated: Patient Education method: Explanation, Demonstration, Tactile cues, and Verbal cues Education comprehension: verbalized understanding, returned demonstration, verbal cues required, and tactile cues required  HOME EXERCISE PROGRAM: Give next session   GOALS: Goals reviewed with patient? Yes  SHORT TERM GOALS: Target date: 05/13/2023    Patient will be independent in home  exercise program to improve strength/mobility for better functional independence with ADLs. Baseline:7/31: give next session Goal status: INITIAL    LONG TERM GOALS: Target date: 07/08/2023   Patient will increase FOTO score to equal to or greater than  70%   to demonstrate statistically significant improvement in mobility and quality of life.  Baseline: 7/31: 60% Goal status: INITIAL  2.  Patient will increase Functional Gait Assessment score to >28/30 as to reduce fall risk and improve dynamic gait safety with community ambulation. Baseline: 7/31: 17/30 Goal status: INITIAL  3.  Patient will increase six minute walk test distance to >1500 for progression to age norm community ambulator and improve gait ability Baseline: 7/31: 1025 ft  Goal status: INITIAL  4.  Patient will return to independent walking and gym program for return to PLOF. Baseline: 7/31: not consisently walking, not going to gym.  Goal status: INITIAL    ASSESSMENT:  CLINICAL IMPRESSION: Patient is a 57 y.o. male who was seen today for physical therapy evaluation and treatment for Parkinson's Disease. Patient has limited stability on L foot in stance/weightbearing with increased ankle instability and foot collapse. He has L foot drag with prolonged ambulation that worsens over time and with dual task. When aware and focusing on foot position he is able to ambulate without foot drag. Patient is highly motivated and has a good prognosis at this time. He has LLE weakness compared to RLE at this time. Patient will benefit from skilled physical therapy to increase stability and mobility for return to PLOF.   OBJECTIVE IMPAIRMENTS: Abnormal gait, decreased activity tolerance, decreased balance, decreased coordination, decreased endurance, decreased mobility, difficulty walking, decreased strength, impaired flexibility, improper body mechanics, postural dysfunction, and pain.   ACTIVITY LIMITATIONS: carrying, lifting,  bending, standing, squatting, stairs, transfers, dressing, locomotion level, and caring for others  PARTICIPATION LIMITATIONS: meal prep, cleaning, laundry, shopping, community activity, occupation, and yard work  PERSONAL FACTORS: Age, Fitness, Past/current experiences, Time since onset of injury/illness/exacerbation, and 3+ comorbidities: Depression, anxiety, arthritis, bilateral sciatica, insomnia, obesity, OSA, seizures, spinal stenosis, spondylolisthesis at L4-5, PTSD, and lymphadenopathy retroperitoneal  are also affecting patient's functional outcome.   REHAB POTENTIAL: Good  CLINICAL DECISION MAKING: Evolving/moderate complexity  EVALUATION COMPLEXITY: Moderate  PLAN:  PT FREQUENCY: 2x/week  PT DURATION: 12 weeks  PLANNED INTERVENTIONS: Therapeutic exercises, Therapeutic activity, Neuromuscular re-education, Balance training, Gait training, Patient/Family education, Self Care, Joint mobilization, Stair training, Vestibular training, Canalith repositioning, Visual/preceptual remediation/compensation, Orthotic/Fit training, Dry Needling, Electrical stimulation, Spinal mobilization, Cryotherapy, Moist heat, Splintting, Taping, Traction, Manual therapy, and Re-evaluation  PLAN FOR NEXT SESSION: give HEP, L foot stabilization, ankle righting reactions    Precious Bard, PT 04/15/2023, 5:27 PM

## 2023-04-15 ENCOUNTER — Ambulatory Visit: Payer: BC Managed Care – PPO | Attending: Neurology

## 2023-04-15 DIAGNOSIS — R262 Difficulty in walking, not elsewhere classified: Secondary | ICD-10-CM | POA: Diagnosis present

## 2023-04-15 DIAGNOSIS — R2681 Unsteadiness on feet: Secondary | ICD-10-CM | POA: Insufficient documentation

## 2023-04-20 ENCOUNTER — Other Ambulatory Visit: Payer: Self-pay | Admitting: Psychiatry

## 2023-04-21 ENCOUNTER — Ambulatory Visit: Payer: BC Managed Care – PPO

## 2023-04-23 NOTE — Therapy (Signed)
OUTPATIENT PHYSICAL THERAPY NEURO TREATMENT   Patient Name: Dakota Davis MRN: [id number] DOB:March 04, 1966, 57 y.o., male Today's Date: 04/27/2023   PCP: Lauro Regulus  REFERRING PROVIDER: Lonell Face MD   END OF SESSION:  PT End of Session - 04/27/23 0929     Visit Number 2    Number of Visits 24    Date for PT Re-Evaluation 07/08/23    PT Start Time 0930    PT Stop Time 1014    PT Time Calculation (min) 44 min    Equipment Utilized During Treatment Gait belt    Activity Tolerance Patient tolerated treatment well    Behavior During Therapy WFL for tasks assessed/performed              Past Medical History:  Diagnosis Date   Anxiety    Anxiety    Arthritis    Depression    Hyperlipidemia    Insomnia    Lymphadenopathy, retroperitoneal    OSA (obstructive sleep apnea)    Parkinson disease    Sciatica    Seizures (HCC)    Spinal stenosis    Spinal stenosis    Spondylisthesis    Past Surgical History:  Procedure Laterality Date   BACK SURGERY     BACK SURGERY     10/31/2016,12/21/2015   CARDIAC CATHETERIZATION     ESOPHAGOGASTRODUODENOSCOPY N/A 11/27/2021   Procedure: ESOPHAGOGASTRODUODENOSCOPY (EGD);  Surgeon: Toledo, Boykin Nearing, MD;  Location: [facility];  Service: Gastroenterology;  Laterality: N/A;   KNEE ARTHROSCOPY     2018   LEFT HEART CATH AND CORONARY ANGIOGRAPHY N/A 08/05/2017   Procedure: LEFT HEART CATH AND CORONARY ANGIOGRAPHY;  Surgeon: Lamar Blinks, MD;  Location: [facility];  Service: Cardiovascular;  Laterality: N/A;   TONSILECTOMY/ADENOIDECTOMY WITH MYRINGOTOMY     Patient Active Problem List   Diagnosis Date Noted   GAD (generalized anxiety disorder) 07/04/2018   PTSD (post-traumatic stress disorder) 07/04/2018   Stable angina 08/03/2017    ONSET DATE: 2022  REFERRING DIAG: Parkinson's Disease  THERAPY DIAG:  Difficulty in walking, not elsewhere classified  Unsteadiness on feet  Rationale for  Evaluation and Treatment: Rehabilitation  SUBJECTIVE:                                                                                                                                                                                             SUBJECTIVE STATEMENT: Patient reports no new aches and pains. No stumbles or falls.  Pt accompanied by: self  PERTINENT HISTORY:   Patient presents to PT for evaluation of Parkinson's Disease. He has additional  L shoulder pain s/p ACDF 11/2022. His Parkinson's has L sided resting tremor with some bilateral tremors. He has limited R knee ROM at baseline. PMH includes Depression, anxiety, arthritis, bilateral sciatica, insomnia, obesity, OSA, seizures, spinal stenosis, spondylolisthesis at L4-5, PTSD, and lymphadenopathy retroperitoneal. Patient reports primary issue on L side, difficulty picking L foot up, drop foot of L foot and heel, stumbles but no falls. Pivots are challenging. Has Dystonia with L foot, starting in R side in past two weeks. 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  PAIN:  Are you having pain?  Dystonia of L foot   PRECAUTIONS: Fall   WEIGHT BEARING RESTRICTIONS: No  FALLS: Has patient fallen in last 6 months?  Stumbles but no falls to ground.   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 Prior C4-C6 ACDF without evidence of hardware complication. No residual high-grade stenosis. 2. Unchanged multilevel cervical spondylosis as described above. Unchanged moderate to severe right neuroforaminal stenosis  at C3-C4. Unchanged mild left neuroforaminal stenosis at C2-C3 and C3-C4.   COGNITION: Overall cognitive status: Within functional limits for tasks assessed   SENSATION: WFL  COORDINATION: Heel slide: L slower Finger to nose test: L dysmetria and slower speed   POSTURE: rounded shoulders, increased thoracic kyphosis, and weight shift right   LOWER EXTREMITY MMT:    MMT Right Eval Left Eval  Hip flexion 5 4  Hip abduction 4 4-  Hip adduction 5 4  Knee flexion 5 4-  Knee extension 5 4-  Ankle dorsiflexion 5 4  Ankle plantarflexion 5 4  (Blank rows = not tested)    TRANSFERS: Assistive device utilized: None  Sit to stand: SBA Stand to sit: SBA   STAIRS: Level of Assistance: SBA Stair Negotiation Technique: Alternating Pattern  with Single Rail on Right Number of Stairs: 4  Height of Stairs: 6 inch  Comments: decreased clearance of LLE  GAIT: Gait pattern:    decreased L ankle DF, R trunk lean during R stance, limited L arm swing, narrow BOS, B decreased step length Distance walked: 1050' Assistive device utilized: None Level of assistance: CGA Comments: see above; L foot collapse with weightbearing.   FUNCTIONAL TESTS:  5 times sit to stand: 14.7 6 minute walk test: 1025 Functional gait assessment: 17/30  PATIENT SURVEYS:  FOTO 60  TODAY'S TREATMENT:                                                                                                                              DATE: 04/27/23   Neuro Re-ed:  Airex pad:  -single limb stance 30 seconds -soccer ball circles 10x clockwise, counterclockwise 10x -6" step airex pad tandem stance 30 seconds x 2 trials each LE placement  In hallway:  -ball toss with dual task of naming food in alphabetical order  TherEx: Marble pickup 5x x6 trials Towel scrunch 10x forward/ 10x backwards; 2 sets   Review HEP: Access Code: [email or web address] URL: [email or web address] Date: 04/27/2023 Prepared by:  Precious Bard  Exercises - Single Leg Stance with Support  - 1 x daily - 7 x weekly - 2 sets - 2 reps - 30 hold - Seated Ankle Alphabet  - 1 x daily - 7 x weekly - 2 sets - 1 reps - 5 hold - Forward T with Counter Support  - 1 x daily - 7 x weekly - 2 sets - 10 reps - 5 hold  PATIENT EDUCATION: Education details: goals, POC Person educated: Patient Education method: Explanation, Demonstration, Tactile cues, and Verbal cues Education comprehension: verbalized understanding, returned demonstration, verbal cues required, and tactile cues required  HOME EXERCISE PROGRAM: Access Code: [email or web address] URL: [email or web address] Date: 04/27/2023 Prepared by: Precious Bard  Exercises - Single Leg Stance with Support  - 1 x daily - 7 x weekly - 2 sets - 2 reps - 30 hold - Seated Ankle Alphabet  - 1 x daily - 7 x weekly - 2 sets - 1 reps - 5 hold - Forward T with Counter Support  - 1 x daily - 7 x weekly - 2 sets - 10 reps - 5 hold  GOALS: Goals reviewed with patient? Yes  SHORT TERM GOALS: Target date: 05/13/2023    Patient will be independent in home exercise program to improve strength/mobility for better functional independence with ADLs. Baseline:7/31: give next session Goal status: INITIAL    LONG TERM GOALS: Target date: 07/08/2023   Patient will increase FOTO score to equal to or greater than  70%   to demonstrate statistically significant improvement in mobility and quality of life.  Baseline: 7/31: 60% Goal status: INITIAL  2.  Patient will increase Functional Gait Assessment score to >28/30 as to reduce fall risk and improve dynamic gait safety with community ambulation. Baseline: 7/31: 17/30 Goal status: INITIAL  3.  Patient will increase six minute walk test distance to >1500 for progression to age norm community ambulator and improve gait ability Baseline: 7/31: 1025 ft  Goal status: INITIAL  4.  Patient will return to independent walking and gym program  for return to PLOF. Baseline: 7/31: not consisently walking, not going to gym.  Goal status: INITIAL    ASSESSMENT:  CLINICAL IMPRESSION: . Patient introduced to and demonstrated understanding of HEP.  He has LLE weakness compared to RLE at this time.Patient is challenged with dual task of naming food in alphabetical order with ball toss indicating continued need for dual task training.  Patient will benefit from skilled physical therapy to increase stability and mobility for return to PLOF.   OBJECTIVE IMPAIRMENTS: Abnormal gait, decreased activity tolerance, decreased balance, decreased coordination, decreased endurance, decreased mobility, difficulty walking, decreased strength, impaired flexibility, improper body mechanics, postural dysfunction, and pain.   ACTIVITY LIMITATIONS: carrying, lifting, bending, standing, squatting, stairs, transfers, dressing, locomotion level, and caring for others  PARTICIPATION LIMITATIONS: meal prep, cleaning, laundry, shopping, community activity, occupation, and yard work  PERSONAL FACTORS: Age, Fitness, Past/current experiences, Time since onset of injury/illness/exacerbation, and 3+ comorbidities: Depression, anxiety, arthritis, bilateral sciatica, insomnia, obesity, OSA, seizures, spinal stenosis, spondylolisthesis at L4-5,  PTSD, and lymphadenopathy retroperitoneal  are also affecting patient's functional outcome.   REHAB POTENTIAL: Good  CLINICAL DECISION MAKING: Evolving/moderate complexity  EVALUATION COMPLEXITY: Moderate  PLAN:  PT FREQUENCY: 2x/week  PT DURATION: 12 weeks  PLANNED INTERVENTIONS: Therapeutic exercises, Therapeutic activity, Neuromuscular re-education, Balance training, Gait training, Patient/Family education, Self Care, Joint mobilization, Stair training, Vestibular training, Canalith repositioning, Visual/preceptual remediation/compensation, Orthotic/Fit training, Dry Needling, Electrical stimulation, Spinal mobilization,  Cryotherapy, Moist heat, Splintting, Taping, Traction, Manual therapy, and Re-evaluation  PLAN FOR NEXT SESSION: dual task, L foot stabilization, ankle righting reactions    Precious Bard, PT 04/27/2023, 10:15 AM

## 2023-04-24 ENCOUNTER — Ambulatory Visit: Payer: BC Managed Care – PPO | Admitting: Physical Therapy

## 2023-04-27 ENCOUNTER — Ambulatory Visit: Payer: BC Managed Care – PPO | Attending: Neurology

## 2023-04-27 DIAGNOSIS — R262 Difficulty in walking, not elsewhere classified: Secondary | ICD-10-CM | POA: Insufficient documentation

## 2023-04-27 DIAGNOSIS — R2681 Unsteadiness on feet: Secondary | ICD-10-CM | POA: Insufficient documentation

## 2023-04-30 ENCOUNTER — Ambulatory Visit: Payer: BC Managed Care – PPO

## 2023-04-30 DIAGNOSIS — R262 Difficulty in walking, not elsewhere classified: Secondary | ICD-10-CM

## 2023-04-30 DIAGNOSIS — R2681 Unsteadiness on feet: Secondary | ICD-10-CM

## 2023-04-30 NOTE — Therapy (Signed)
OUTPATIENT PHYSICAL THERAPY NEURO TREATMENT   Patient Name: Dakota Davis MRN: [id number] DOB:Jul 18, 1966, 57 y.o., male Today's Date: 04/30/2023   PCP: Lauro Regulus  REFERRING PROVIDER: Lonell Face MD   END OF SESSION:  PT End of Session - 04/30/23 0802     Visit Number 3    Number of Visits 24    Date for PT Re-Evaluation 07/08/23    PT Start Time 0802    PT Stop Time 0846    PT Time Calculation (min) 44 min    Equipment Utilized During Treatment Gait belt    Activity Tolerance Patient tolerated treatment well    Behavior During Therapy WFL for tasks assessed/performed               Past Medical History:  Diagnosis Date   Anxiety    Anxiety    Arthritis    Depression    Hyperlipidemia    Insomnia    Lymphadenopathy, retroperitoneal    OSA (obstructive sleep apnea)    Parkinson disease    Sciatica    Seizures (HCC)    Spinal stenosis    Spinal stenosis    Spondylisthesis    Past Surgical History:  Procedure Laterality Date   BACK SURGERY     BACK SURGERY     10/31/2016,12/21/2015   CARDIAC CATHETERIZATION     ESOPHAGOGASTRODUODENOSCOPY N/A 11/27/2021   Procedure: ESOPHAGOGASTRODUODENOSCOPY (EGD);  Surgeon: Toledo, Boykin Nearing, MD;  Location: [facility];  Service: Gastroenterology;  Laterality: N/A;   KNEE ARTHROSCOPY     2018   LEFT HEART CATH AND CORONARY ANGIOGRAPHY N/A 08/05/2017   Procedure: LEFT HEART CATH AND CORONARY ANGIOGRAPHY;  Surgeon: Lamar Blinks, MD;  Location: [facility];  Service: Cardiovascular;  Laterality: N/A;   TONSILECTOMY/ADENOIDECTOMY WITH MYRINGOTOMY     Patient Active Problem List   Diagnosis Date Noted   GAD (generalized anxiety disorder) 07/04/2018   PTSD (post-traumatic stress disorder) 07/04/2018   Stable angina 08/03/2017    ONSET DATE: 2022  REFERRING DIAG: Parkinson's Disease  THERAPY DIAG:  Difficulty in walking, not elsewhere classified  Unsteadiness on feet  Rationale for  Evaluation and Treatment: Rehabilitation  SUBJECTIVE:                                                                                                                                                                                             SUBJECTIVE STATEMENT: Patient reports his ankles felt fatigued but not painful after last session.   Pt accompanied by: self  PERTINENT HISTORY:   Patient presents to PT for evaluation of Parkinson's Disease.  He has additional L shoulder pain s/p ACDF 11/2022. His Parkinson's has L sided resting tremor with some bilateral tremors. He has limited R knee ROM at baseline. PMH includes Depression, anxiety, arthritis, bilateral sciatica, insomnia, obesity, OSA, seizures, spinal stenosis, spondylolisthesis at L4-5, PTSD, and lymphadenopathy retroperitoneal. Patient reports primary issue on L side, difficulty picking L foot up, drop foot of L foot and heel, stumbles but no falls. Pivots are challenging. Has Dystonia with L foot, starting in R side in past two weeks. 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  PAIN:  Are you having pain?  Dystonia of L foot   PRECAUTIONS: Fall   WEIGHT BEARING RESTRICTIONS: No  FALLS: Has patient fallen in last 6 months?  Stumbles but no falls to ground.   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 Prior C4-C6 ACDF without evidence of hardware complication. No residual high-grade stenosis. 2. Unchanged multilevel cervical spondylosis as described above. Unchanged moderate to severe right  neuroforaminal stenosis at C3-C4. Unchanged mild left neuroforaminal stenosis at C2-C3 and C3-C4.   COGNITION: Overall cognitive status: Within functional limits for tasks assessed   SENSATION: WFL  COORDINATION: Heel slide: L slower Finger to nose test: L dysmetria and slower speed   POSTURE: rounded shoulders, increased thoracic kyphosis, and weight shift right   LOWER EXTREMITY MMT:    MMT Right Eval Left Eval  Hip flexion 5 4  Hip abduction 4 4-  Hip adduction 5 4  Knee flexion 5 4-  Knee extension 5 4-  Ankle dorsiflexion 5 4  Ankle plantarflexion 5 4  (Blank rows = not tested)    TRANSFERS: Assistive device utilized: None  Sit to stand: SBA Stand to sit: SBA   STAIRS: Level of Assistance: SBA Stair Negotiation Technique: Alternating Pattern  with Single Rail on Right Number of Stairs: 4  Height of Stairs: 6 inch  Comments: decreased clearance of LLE  GAIT: Gait pattern:    decreased L ankle DF, R trunk lean during R stance, limited L arm swing, narrow BOS, B decreased step length Distance walked: 1050' Assistive device utilized: None Level of assistance: CGA Comments: see above; L foot collapse with weightbearing.   FUNCTIONAL TESTS:  5 times sit to stand: 14.7 6 minute walk test: 1025 Functional gait assessment: 17/30  PATIENT SURVEYS:  FOTO 60  TODAY'S TREATMENT:                                                                                                                              DATE: 04/30/23  Neuro Re-ed: Airex pad:  -single limb stance 30 seconds x 2 trials each LE  -dynadisc airex pad tandem stance 30 seconds x 2 trials each LE placement -airex pad: dual task with putting letters in alphabetical order x 5 minutes  In hallway:  -ball toss with dual task of naming food in alphabetical order  Activity Description: red=right hand/foot green left hand/foot Activity Setting:  The Blaze Pod Random setting was chosen to enhance  cognitive processing and agility, providing an unpredictable environment to simulate real-world scenarios, and fostering quick reactions and adaptability.   Number of Pods:  6 Cycles/Sets:  3 Duration (Time or Hit Count):  30  Patient Stats   Hits:   21,23,25  Activity Description: 6 in a line; red-catch red ball, green =catch green ball  Activity Setting: The Blaze Pod Random setting was chosen to enhance cognitive processing and agility, providing an unpredictable environment to simulate real-world scenarios, and fostering quick reactions and adaptability.   Number of Pods:  6 Cycles/Sets:  3 Duration (Time or Hit Count):  10x      TherEx: RTB 4 way ankle; 10x each LE Wall angels( very challenging ) 5x Wall posture 10x Wall gastroc stretch 30 seconds each LE    PATIENT EDUCATION: Education details: goals, POC Person educated: Patient Education method: Explanation, Demonstration, Tactile cues, and Verbal cues Education comprehension: verbalized understanding, returned demonstration, verbal cues required, and tactile cues required  HOME EXERCISE PROGRAM: Access Code: [email or web address] URL: [email or web address] Date: 04/27/2023 Prepared by: Precious Bard  Exercises - Single Leg Stance with Support  - 1 x daily - 7 x weekly - 2 sets - 2 reps - 30 hold - Seated Ankle Alphabet  - 1 x daily - 7 x weekly - 2 sets - 1 reps - 5 hold - Forward T with Counter Support  - 1 x daily - 7 x weekly - 2 sets - 10 reps - 5 hold  GOALS: Goals reviewed with patient? Yes  SHORT TERM GOALS: Target date: 05/13/2023    Patient will be independent in home exercise program to improve strength/mobility for better functional independence with ADLs. Baseline:7/31: give next session Goal status: INITIAL    LONG TERM GOALS: Target date: 07/08/2023   Patient will increase FOTO score to equal to or greater than  70%   to demonstrate statistically significant improvement in mobility and  quality of life.  Baseline: 7/31: 60% Goal status: INITIAL  2.  Patient will increase Functional Gait Assessment score to >28/30 as to reduce fall risk and improve dynamic gait safety with community ambulation. Baseline: 7/31: 17/30 Goal status: INITIAL  3.  Patient will increase six minute walk test distance to >1500 for progression to age norm community ambulator and improve gait ability Baseline: 7/31: 1025 ft  Goal status: INITIAL  4.  Patient will return to independent walking and gym program for return to PLOF. Baseline: 7/31: not consisently walking, not going to gym.  Goal status: INITIAL    ASSESSMENT:  CLINICAL IMPRESSION: Patient presents with excellent motivation throughout physical therapy session. Progression of dynamic stability with dual task tolerated well. Patient is fatigued with interventions but tolerates all well. He has limited stabilization of LLE noted throughout session indicating continued area of focus.  Patient will benefit from skilled physical therapy to increase stability and mobility for return to PLOF.   OBJECTIVE IMPAIRMENTS: Abnormal gait, decreased activity tolerance, decreased balance, decreased coordination, decreased endurance, decreased mobility, difficulty walking, decreased strength, impaired flexibility, improper  body mechanics, postural dysfunction, and pain.   ACTIVITY LIMITATIONS: carrying, lifting, bending, standing, squatting, stairs, transfers, dressing, locomotion level, and caring for others  PARTICIPATION LIMITATIONS: meal prep, cleaning, laundry, shopping, community activity, occupation, and yard work  PERSONAL FACTORS: Age, Fitness, Past/current experiences, Time since onset of injury/illness/exacerbation, and 3+ comorbidities: Depression, anxiety, arthritis, bilateral sciatica, insomnia, obesity, OSA, seizures, spinal stenosis, spondylolisthesis at L4-5, PTSD, and lymphadenopathy retroperitoneal  are also affecting patient's  functional outcome.   REHAB POTENTIAL: Good  CLINICAL DECISION MAKING: Evolving/moderate complexity  EVALUATION COMPLEXITY: Moderate  PLAN:  PT FREQUENCY: 2x/week  PT DURATION: 12 weeks  PLANNED INTERVENTIONS: Therapeutic exercises, Therapeutic activity, Neuromuscular re-education, Balance training, Gait training, Patient/Family education, Self Care, Joint mobilization, Stair training, Vestibular training, Canalith repositioning, Visual/preceptual remediation/compensation, Orthotic/Fit training, Dry Needling, Electrical stimulation, Spinal mobilization, Cryotherapy, Moist heat, Splintting, Taping, Traction, Manual therapy, and Re-evaluation  PLAN FOR NEXT SESSION: dual task, L foot stabilization, ankle righting reactions    Precious Bard, PT 04/30/2023, 8:53 AM

## 2023-04-30 NOTE — Therapy (Deleted)
OUTPATIENT PHYSICAL THERAPY NEURO TREATMENT   Patient Name: Uchenna Sidley MRN: [id number] DOB:10-26-1965, 57 y.o., male Today's Date: 04/30/2023   PCP: Lauro Regulus  REFERRING PROVIDER: Cristopher Peru K MD   END OF SESSION:     Past Medical History:  Diagnosis Date   Anxiety    Anxiety    Arthritis    Depression    Hyperlipidemia    Insomnia    Lymphadenopathy, retroperitoneal    OSA (obstructive sleep apnea)    Parkinson disease    Sciatica    Seizures (HCC)    Spinal stenosis    Spinal stenosis    Spondylisthesis    Past Surgical History:  Procedure Laterality Date   BACK SURGERY     BACK SURGERY     10/31/2016,12/21/2015   CARDIAC CATHETERIZATION     ESOPHAGOGASTRODUODENOSCOPY N/A 11/27/2021   Procedure: ESOPHAGOGASTRODUODENOSCOPY (EGD);  Surgeon: Toledo, Boykin Nearing, MD;  Location: [facility];  Service: Gastroenterology;  Laterality: N/A;   KNEE ARTHROSCOPY     2018   LEFT HEART CATH AND CORONARY ANGIOGRAPHY N/A 08/05/2017   Procedure: LEFT HEART CATH AND CORONARY ANGIOGRAPHY;  Surgeon: Lamar Blinks, MD;  Location: [facility];  Service: Cardiovascular;  Laterality: N/A;   TONSILECTOMY/ADENOIDECTOMY WITH MYRINGOTOMY     Patient Active Problem List   Diagnosis Date Noted   GAD (generalized anxiety disorder) 07/04/2018   PTSD (post-traumatic stress disorder) 07/04/2018   Stable angina 08/03/2017    ONSET DATE: 2022  REFERRING DIAG: Parkinson's Disease  THERAPY DIAG:  Difficulty in walking, not elsewhere classified  Unsteadiness on feet  Rationale for Evaluation and Treatment: Rehabilitation  SUBJECTIVE:                                                                                                                                                                                             SUBJECTIVE STATEMENT: Patient reports no new aches and pains. No stumbles or falls.  Pt accompanied by: self  PERTINENT HISTORY:    Patient presents to PT for evaluation of Parkinson's Disease. He has additional L shoulder pain s/p ACDF 11/2022. His Parkinson's has L sided resting tremor with some bilateral tremors. He has limited R knee ROM at baseline. PMH includes Depression, anxiety, arthritis, bilateral sciatica, insomnia, obesity, OSA, seizures, spinal stenosis, spondylolisthesis at L4-5, PTSD, and lymphadenopathy retroperitoneal. Patient reports primary issue on L side, difficulty picking L foot up, drop foot of L foot and heel, stumbles but no falls. Pivots are challenging. Has Dystonia with L foot, starting in R side in past two weeks. Is a caregiver to his son  who has had multiple strokes. 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  PAIN:  Are you having pain?  Dystonia of L foot   PRECAUTIONS: Fall   WEIGHT BEARING RESTRICTIONS: No  FALLS: Has patient fallen in last 6 months?  Stumbles but no falls to ground.   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 Prior C4-C6 ACDF without evidence of hardware complication. No residual high-grade stenosis. 2. Unchanged multilevel cervical spondylosis as described above. Unchanged moderate to severe right neuroforaminal stenosis at C3-C4. Unchanged mild left neuroforaminal stenosis at C2-C3 and C3-C4.   COGNITION: Overall cognitive status: Within functional limits for tasks assessed   SENSATION: WFL  COORDINATION: Heel slide: L slower Finger to nose test: L dysmetria and slower speed   POSTURE: rounded shoulders, increased thoracic kyphosis, and weight shift right   LOWER EXTREMITY  MMT:    MMT Right Eval Left Eval  Hip flexion 5 4  Hip abduction 4 4-  Hip adduction 5 4  Knee flexion 5 4-  Knee extension 5 4-  Ankle dorsiflexion 5 4  Ankle plantarflexion 5 4  (Blank rows = not tested)    TRANSFERS: Assistive device utilized: None  Sit to stand: SBA Stand to sit: SBA   STAIRS: Level of Assistance: SBA Stair Negotiation Technique: Alternating Pattern  with Single Rail on Right Number of Stairs: 4  Height of Stairs: 6 inch  Comments: decreased clearance of LLE  GAIT: Gait pattern:    decreased L ankle DF, R trunk lean during R stance, limited L arm swing, narrow BOS, B decreased step length Distance walked: 1050' Assistive device utilized: None Level of assistance: CGA Comments: see above; L foot collapse with weightbearing.   FUNCTIONAL TESTS:  5 times sit to stand: 14.7 6 minute walk test: 1025 Functional gait assessment: 17/30  PATIENT SURVEYS:  FOTO 60  TODAY'S TREATMENT:                                                                                                                              DATE: 04/30/23   Neuro Re-ed: Airex pad:  -single limb stance 30 seconds -soccer ball circles 10x clockwise, counterclockwise 10x -6" step airex pad tandem stance 30 seconds x 2 trials each LE placement  In hallway:  -ball toss with dual task of naming food in alphabetical order  TherEx: Marble pickup 5x x6 trials Towel scrunch 10x forward/ 10x backwards; 2 sets   Review HEP: Access Code: [email or web address] URL: [email or web address] Date: 04/27/2023 Prepared by: Precious Bard  Exercises - Single Leg Stance with Support  - 1 x daily - 7 x  weekly - 2 sets - 2 reps - 30 hold - Seated Ankle Alphabet  - 1 x daily - 7 x weekly - 2 sets - 1 reps - 5 hold - Forward T with Counter Support  - 1 x daily - 7 x weekly - 2 sets - 10 reps - 5 hold  PATIENT EDUCATION: Education details: goals, POC Person educated: Patient Education method:  Explanation, Demonstration, Tactile cues, and Verbal cues Education comprehension: verbalized understanding, returned demonstration, verbal cues required, and tactile cues required  HOME EXERCISE PROGRAM: Access Code: [email or web address] URL: [email or web address] Date: 04/27/2023 Prepared by: Precious Bard  Exercises - Single Leg Stance with Support  - 1 x daily - 7 x weekly - 2 sets - 2 reps - 30 hold - Seated Ankle Alphabet  - 1 x daily - 7 x weekly - 2 sets - 1 reps - 5 hold - Forward T with Counter Support  - 1 x daily - 7 x weekly - 2 sets - 10 reps - 5 hold  GOALS: Goals reviewed with patient? Yes  SHORT TERM GOALS: Target date: 05/13/2023    Patient will be independent in home exercise program to improve strength/mobility for better functional independence with ADLs. Baseline:7/31: give next session Goal status: INITIAL    LONG TERM GOALS: Target date: 07/08/2023   Patient will increase FOTO score to equal to or greater than  70%   to demonstrate statistically significant improvement in mobility and quality of life.  Baseline: 7/31: 60% Goal status: INITIAL  2.  Patient will increase Functional Gait Assessment score to >28/30 as to reduce fall risk and improve dynamic gait safety with community ambulation. Baseline: 7/31: 17/30 Goal status: INITIAL  3.  Patient will increase six minute walk test distance to >1500 for progression to age norm community ambulator and improve gait ability Baseline: 7/31: 1025 ft  Goal status: INITIAL  4.  Patient will return to independent walking and gym program for return to PLOF. Baseline: 7/31: not consisently walking, not going to gym.  Goal status: INITIAL    ASSESSMENT:  CLINICAL IMPRESSION:  Patient introduced to and demonstrated understanding of HEP.  He has LLE weakness compared to RLE at this time.Patient is challenged with dual task of naming food in alphabetical order with ball toss indicating continued need for  dual task training.  Patient will benefit from skilled physical therapy to increase stability and mobility for return to PLOF.   OBJECTIVE IMPAIRMENTS: Abnormal gait, decreased activity tolerance, decreased balance, decreased coordination, decreased endurance, decreased mobility, difficulty walking, decreased strength, impaired flexibility, improper body mechanics, postural dysfunction, and pain.   ACTIVITY LIMITATIONS: carrying, lifting, bending, standing, squatting, stairs, transfers, dressing, locomotion level, and caring for others  PARTICIPATION LIMITATIONS: meal prep, cleaning, laundry, shopping, community activity, occupation, and yard work  PERSONAL FACTORS: Age, Fitness, Past/current experiences, Time since onset of injury/illness/exacerbation, and 3+ comorbidities: Depression, anxiety, arthritis, bilateral sciatica, insomnia, obesity, OSA, seizures, spinal stenosis, spondylolisthesis at L4-5, PTSD, and lymphadenopathy retroperitoneal  are also affecting patient's functional outcome.   REHAB POTENTIAL: Good  CLINICAL DECISION MAKING: Evolving/moderate complexity  EVALUATION COMPLEXITY: Moderate  PLAN:  PT FREQUENCY: 2x/week  PT DURATION: 12 weeks  PLANNED INTERVENTIONS: Therapeutic exercises, Therapeutic activity, Neuromuscular re-education, Balance training, Gait training, Patient/Family education, Self Care, Joint mobilization, Stair training, Vestibular training, Canalith repositioning, Visual/preceptual remediation/compensation, Orthotic/Fit training, Dry Needling, Electrical stimulation, Spinal mobilization, Cryotherapy, Moist heat, Splintting, Taping, Traction, Manual therapy, and Re-evaluation  PLAN FOR NEXT SESSION: dual task, L  foot stabilization, ankle righting reactions    Norman Herrlich, PT 04/30/2023, 7:12 AM

## 2023-05-04 NOTE — Therapy (Signed)
OUTPATIENT PHYSICAL THERAPY NEURO TREATMENT   Patient Name: Dakota Davis MRN: [id number] DOB:[birth year], 57 y.o., male Today's Date: 05/05/2023   PCP: Lauro Regulus  REFERRING PROVIDER: Lonell Face MD   END OF SESSION:  PT End of Session - 05/05/23 1616     Visit Number 4    Number of Visits 24    Date for PT Re-Evaluation 07/08/23    PT Start Time 1615    PT Stop Time 1659    PT Time Calculation (min) 44 min    Equipment Utilized During Treatment Gait belt    Activity Tolerance Patient tolerated treatment well    Behavior During Therapy WFL for tasks assessed/performed                Past Medical History:  Diagnosis Date   Anxiety    Anxiety    Arthritis    Depression    Hyperlipidemia    Insomnia    Lymphadenopathy, retroperitoneal    OSA (obstructive sleep apnea)    Parkinson disease    Sciatica    Seizures (HCC)    Spinal stenosis    Spinal stenosis    Spondylisthesis    Past Surgical History:  Procedure Laterality Date   BACK SURGERY     BACK SURGERY     10/31/2016,12/21/2015   CARDIAC CATHETERIZATION     ESOPHAGOGASTRODUODENOSCOPY N/A 11/27/2021   Procedure: ESOPHAGOGASTRODUODENOSCOPY (EGD);  Surgeon: Toledo, Boykin Nearing, MD;  Location: [facility];  Service: Gastroenterology;  Laterality: N/A;   KNEE ARTHROSCOPY     2018   LEFT HEART CATH AND CORONARY ANGIOGRAPHY N/A 08/05/2017   Procedure: LEFT HEART CATH AND CORONARY ANGIOGRAPHY;  Surgeon: Lamar Blinks, MD;  Location: [facility];  Service: Cardiovascular;  Laterality: N/A;   TONSILECTOMY/ADENOIDECTOMY WITH MYRINGOTOMY     Patient Active Problem List   Diagnosis Date Noted   GAD (generalized anxiety disorder) 07/04/2018   PTSD (post-traumatic stress disorder) 07/04/2018   Stable angina 08/03/2017    ONSET DATE: 2022  REFERRING DIAG: Parkinson's Disease  THERAPY DIAG:  Difficulty in walking, not elsewhere classified  Unsteadiness on feet  Rationale for  Evaluation and Treatment: Rehabilitation  SUBJECTIVE:                                                                                                                                                                                             SUBJECTIVE STATEMENT: Patient reports he is getting another injection Thursday morning in his cervical spine.    Pt accompanied by: self  PERTINENT HISTORY:   Patient presents to PT for evaluation  of Parkinson's Disease. He has additional L shoulder pain s/p ACDF 11/2022. His Parkinson's has L sided resting tremor with some bilateral tremors. He has limited R knee ROM at baseline. PMH includes Depression, anxiety, arthritis, bilateral sciatica, insomnia, obesity, OSA, seizures, spinal stenosis, spondylolisthesis at L4-5, PTSD, and lymphadenopathy retroperitoneal. Patient reports primary issue on L side, difficulty picking L foot up, drop foot of L foot and heel, stumbles but no falls. Pivots are challenging. Has Dystonia with L foot, starting in R side in past two weeks. 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  PAIN:  Are you having pain?  Dystonia of L foot   PRECAUTIONS: Fall   WEIGHT BEARING RESTRICTIONS: No  FALLS: Has patient fallen in last 6 months?  Stumbles but no falls to ground.   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 Prior C4-C6 ACDF without evidence of hardware complication. No residual high-grade stenosis. 2. Unchanged multilevel cervical spondylosis as described above. Unchanged moderate to severe  right neuroforaminal stenosis at C3-C4. Unchanged mild left neuroforaminal stenosis at C2-C3 and C3-C4.   COGNITION: Overall cognitive status: Within functional limits for tasks assessed   SENSATION: WFL  COORDINATION: Heel slide: L slower Finger to nose test: L dysmetria and slower speed   POSTURE: rounded shoulders, increased thoracic kyphosis, and weight shift right   LOWER EXTREMITY MMT:    MMT Right Eval Left Eval  Hip flexion 5 4  Hip abduction 4 4-  Hip adduction 5 4  Knee flexion 5 4-  Knee extension 5 4-  Ankle dorsiflexion 5 4  Ankle plantarflexion 5 4  (Blank rows = not tested)    TRANSFERS: Assistive device utilized: None  Sit to stand: SBA Stand to sit: SBA   STAIRS: Level of Assistance: SBA Stair Negotiation Technique: Alternating Pattern  with Single Rail on Right Number of Stairs: 4  Height of Stairs: 6 inch  Comments: decreased clearance of LLE  GAIT: Gait pattern:    decreased L ankle DF, R trunk lean during R stance, limited L arm swing, narrow BOS, B decreased step length Distance walked: 1050' Assistive device utilized: None Level of assistance: CGA Comments: see above; L foot collapse with weightbearing.   FUNCTIONAL TESTS:  5 times sit to stand: 14.7 6 minute walk test: 1025 Functional gait assessment: 17/30  PATIENT SURVEYS:  FOTO 60  TODAY'S TREATMENT:  DATE: 05/05/23   Neuro Re-ed: Bosu ball:  Round side up - crane step ups SUE support 10x each side -lateral jump to single limb with UE support   Flat side up: -static stand 30 seconds -mini squats 10x   In hallway:  -holding cone with ball on it with dual task of naming historical events 150 ft x 2 trials  Activity Description: one per stair; step up/down  Activity Setting: [employer] Random setting was chosen to enhance cognitive  processing and agility, providing an unpredictable environment to simulate real-world scenarios, and fostering quick reactions and adaptability.   Number of Pods:  6 Cycles/Sets:  3 Duration (Time or Hit Count):  10x    TherEx: Wall jumps 10x; 2 sets  RTB ER 10x,  RTB DF 10x    Trigger Point Dry Needling (TDN), unbilled Education performed with patient regarding potential benefit of TDN. Reviewed precautions and risks with patient. Reviewed special precautions/risks over lung fields which include pneumothorax. Reviewed signs and symptoms of pneumothorax and advised pt to go to ER immediately if these symptoms develop advise them of dry needling treatment. Extensive time spent with pt to ensure full understanding of TDN risks. Pt provided verbal consent to treatment. TDN performed to  with 0.25 x 40 single needle placements with local twitch response (LTR). Pistoning technique utilized. Improved pain-free motion following intervention. L upper trap x 2 minutes prone position    PATIENT EDUCATION: Education details: goals, POC Person educated: Patient Education method: Explanation, Demonstration, Tactile cues, and Verbal cues Education comprehension: verbalized understanding, returned demonstration, verbal cues required, and tactile cues required  HOME EXERCISE PROGRAM: Access Code: [email or web address] URL: [email or web address] Date: 04/27/2023 Prepared by: Precious Bard  Exercises - Single Leg Stance with Support  - 1 x daily - 7 x weekly - 2 sets - 2 reps - 30 hold - Seated Ankle Alphabet  - 1 x daily - 7 x weekly - 2 sets - 1 reps - 5 hold - Forward T with Counter Support  - 1 x daily - 7 x weekly - 2 sets - 10 reps - 5 hold  GOALS: Goals reviewed with patient? Yes  SHORT TERM GOALS: Target date: 05/13/2023    Patient will be independent in home exercise program to improve strength/mobility for better functional independence with ADLs. Baseline:7/31: give next  session Goal status: INITIAL    LONG TERM GOALS: Target date: 07/08/2023   Patient will increase FOTO score to equal to or greater than  70%   to demonstrate statistically significant improvement in mobility and quality of life.  Baseline: 7/31: 60% Goal status: INITIAL  2.  Patient will increase Functional Gait Assessment score to >28/30 as to reduce fall risk and improve dynamic gait safety with community ambulation. Baseline: 7/31: 17/30 Goal status: INITIAL  3.  Patient will increase six minute walk test distance to >1500 for progression to age norm community ambulator and improve gait ability Baseline: 7/31: 1025 ft  Goal status: INITIAL  4.  Patient will return to independent walking and gym program for return to PLOF. Baseline: 7/31: not consisently walking, not going to gym.  Goal status: INITIAL    ASSESSMENT:  CLINICAL IMPRESSION: Patient is tolerating progressive ankle stabilization interventions. He is highly motivated throughout session and eager to progress his functional mobility. Patient is challenged with powerful movements laterally and wall jump indicating continued area of focus..  Patient will benefit from skilled physical therapy to increase stability and mobility for return  to PLOF.   OBJECTIVE IMPAIRMENTS: Abnormal gait, decreased activity tolerance, decreased balance, decreased coordination, decreased endurance, decreased mobility, difficulty walking, decreased strength, impaired flexibility, improper body mechanics, postural dysfunction, and pain.   ACTIVITY LIMITATIONS: carrying, lifting, bending, standing, squatting, stairs, transfers, dressing, locomotion level, and caring for others  PARTICIPATION LIMITATIONS: meal prep, cleaning, laundry, shopping, community activity, occupation, and yard work  PERSONAL FACTORS: Age, Fitness, Past/current experiences, Time since onset of injury/illness/exacerbation, and 3+ comorbidities: Depression, anxiety,  arthritis, bilateral sciatica, insomnia, obesity, OSA, seizures, spinal stenosis, spondylolisthesis at L4-5, PTSD, and lymphadenopathy retroperitoneal  are also affecting patient's functional outcome.   REHAB POTENTIAL: Good  CLINICAL DECISION MAKING: Evolving/moderate complexity  EVALUATION COMPLEXITY: Moderate  PLAN:  PT FREQUENCY: 2x/week  PT DURATION: 12 weeks  PLANNED INTERVENTIONS: Therapeutic exercises, Therapeutic activity, Neuromuscular re-education, Balance training, Gait training, Patient/Family education, Self Care, Joint mobilization, Stair training, Vestibular training, Canalith repositioning, Visual/preceptual remediation/compensation, Orthotic/Fit training, Dry Needling, Electrical stimulation, Spinal mobilization, Cryotherapy, Moist heat, Splintting, Taping, Traction, Manual therapy, and Re-evaluation  PLAN FOR NEXT SESSION: dual task, L foot stabilization, ankle righting reactions    Precious Bard, PT 05/05/2023, 5:06 PM

## 2023-05-05 ENCOUNTER — Encounter: Payer: Self-pay | Admitting: Psychiatry

## 2023-05-05 ENCOUNTER — Ambulatory Visit: Payer: BC Managed Care – PPO

## 2023-05-05 ENCOUNTER — Ambulatory Visit (INDEPENDENT_AMBULATORY_CARE_PROVIDER_SITE_OTHER): Payer: BC Managed Care – PPO | Admitting: Psychiatry

## 2023-05-05 DIAGNOSIS — F4310 Post-traumatic stress disorder, unspecified: Secondary | ICD-10-CM | POA: Diagnosis not present

## 2023-05-05 DIAGNOSIS — R2681 Unsteadiness on feet: Secondary | ICD-10-CM

## 2023-05-05 DIAGNOSIS — F3341 Major depressive disorder, recurrent, in partial remission: Secondary | ICD-10-CM | POA: Diagnosis not present

## 2023-05-05 DIAGNOSIS — R262 Difficulty in walking, not elsewhere classified: Secondary | ICD-10-CM | POA: Diagnosis not present

## 2023-05-05 DIAGNOSIS — F5105 Insomnia due to other mental disorder: Secondary | ICD-10-CM

## 2023-05-05 DIAGNOSIS — G20C Parkinsonism, unspecified: Secondary | ICD-10-CM

## 2023-05-05 DIAGNOSIS — M542 Cervicalgia: Secondary | ICD-10-CM

## 2023-05-05 DIAGNOSIS — F411 Generalized anxiety disorder: Secondary | ICD-10-CM

## 2023-05-05 MED ORDER — CLONAZEPAM 1 MG PO TABS: 1.5000 mg | ORAL_TABLET | Freq: Every day | ORAL | 1 refills | Status: DC

## 2023-05-05 MED ORDER — DULOXETINE HCL 30 MG PO CPEP
90.0000 mg | ORAL_CAPSULE | Freq: Every day | ORAL | 1 refills | Status: DC
Start: 2023-05-05 — End: 2023-05-21

## 2023-05-05 MED ORDER — BREXPIPRAZOLE 2 MG PO TABS: 2.0000 mg | ORAL_TABLET | Freq: Every day | ORAL | 1 refills | Status: DC

## 2023-05-05 NOTE — Progress Notes (Signed)
Dakota Davis [id number] 02/13/1966 57 y.o.  Subjective:   Patient ID:  Dakota Davis is a 57 y.o. (DOB October 19, 1965) male.  Chief Complaint:  Chief Complaint  Patient presents with   Follow-up   Depression   Anxiety   Medication Reaction    Depression        Associated symptoms include no headaches.  Past medical history includes anxiety.   Anxiety Patient reports no dizziness, nervous/anxious behavior or palpitations.     Dakota Davis presents to the office today for follow-up of generalized anxiety disorder with elements of PTSD and major depression.  seen October 2020.  No meds were changed.  He remained on sertraline 200, aripiprazole 10 mg and Sonata or trazodone as needed insomnia.  January 11, 2020 appointment the following is noted: Had Covid Feb with flu-like sx and fatigue lasted several days. At times feels he's slipping into a bit more depression with less motivation and isolating.  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  Patient reports stable mood and denies depressed or irritable moods.  Patient with residual anxiety which sometimes interferes with sleep.  Sonata helps prn.  2-3 days a week can be hard to shut off brain.  Occ EFA.Marland Kitchen Usually 7-8 hour of sleep.  OCc daytime tiredness. Denies appetite disturbance.  Patient reports that energy and motivation have been good.  Patient denies any difficulty with concentration.  Patient denies any suicidal ideation. Plan:  He wanted to try Rexulti 2 mg augmentation  05/14/20 appt with the following noted: Rexulti for 2 mos helped but cost $250/mo copay and couldn't afford.  Felt worse off of it. Anxiety out the roof with uncertain cause. Seems worse than ever.  Varying subjects for worry.  Can't seem to get a hold of it.  Wife notices he's distant for weeks. Rexulti helped significantly.   Plan: sampled Rexulti  07/11/2020 appointment with the following noted: Been able  to stay on Rexulti and is better on it.  Clear difference.  Anxiety is better and manageable.  Mild depression comes and goes and manageable. No SE. Sleep not great with trouble staying asleep.  EFA with variable times.   Plan: Clearly better with Rexulti 2 mg in addition to sertraline 200 mg daily. Recommend a trial of trazodone at 100 250 mg nightly for insomnia  09/21/2020 phone call from patient stating he could no longer afford Rexulti.  He had already failed Abilify.  He is likely to be unable to afford the other partial dopamine agonist Vraylar.  Therefore initiated off label trial of pramipexole to take the place of the Rexulti.  Patient has a pending appointment in 3 weeks.  10/11/2020 appointment with the following noted: Ran out of Rexulti bc cost with new year was going to be $1000.   Overall still doing fine and has been since last seen.  Not noticed any changes so far with switch from Rexulti to pramipexole. Sleep is better.  Awakens but back to sleep.  Depression and anxiety are manageable. Patient reports stable mood and denies depressed or irritable moods.  Patient denies any recent difficulty with anxiety.  Patient denies difficulty with sleep initiation or maintenance. Denies appetite disturbance.  Patient reports that energy and motivation have been good.  Patient denies any difficulty with concentration.  Patient denies any suicidal ideation. No SE Plan: Clearly better with Rexulti 2 mg in addition to sertraline 200 mg daily. So far has been OK with  3 week change to pramipexole but he could still relaps bc of long half life of Rexulti. Switch for sleep to trazodone.  01/07/21 appt noted: Ok overall.  Noticed some things he just wrote off like finger twitching and now resting tremor on R hand and when does something it goes away.  Went to PCP and awaiting referral to neurologist.  Also had some balance issues for some months also and wonders about PD.   Depression is OK.  Caffeine  as late as bedtime.  Coke Zero and diet Mtn Dew. Plan: No med changes  03/19/2021 appointment with the following noted: Reviewed neuro note 02/21/2021 Dr. Sherryll Burger who suspects Parkinson's disease and started carbidopa levodopa. Maybe less stiffness with Sinemet. Pretty good re: mental health since here.  Trazodone helps some with sleep. No SE Plan: Continue pramipexole 0.25 mg BID Sertraline 200 daily trazodone 100 -200 mg HS helps go asleep but doesn't stay asleep..   Switch to decaf by 3 PM.  Emphasized this  07/22/21 appt noted: Still pretty well.  Depression and anxiety pretty under control. Continue meds as above. No SE CO EFA/EMA. Awakens after 3 hours. Sometimes back to sleep.  Has OSA and on CPAP. Usually works daytime so little napping. Plan: Continue pramipexole 0.25 mg BID Sertraline 200 daily Switch trazodone bc doesn't stay asleep to hydroxyzine 25-50 mg HS.Marland Kitchen  Also gave RX Belsomra samples in case it failed. 10-20 Switch to decaf by 3 PM.  Emphasized this  He's done this  11/12/21 appt noted: Hydroxyzine works but hangover  and lingers through the day with 25 mg. Belsomra also hangover but did keep him asleep better. Sleep same ith decent effect initially with trazodone.  Sleep is not as good as needed and some irritability.   Mood pretty fair without major problems. Patient reports stable mood and denies depressed or irritable moods.  Patient denies any recent difficulty with anxiety other than mild background levels.  His wife and her family are close and create stress.   Denies appetite disturbance.  Patient reports that energy and motivation have been good.  Patient denies any difficulty with concentration.  Patient denies any suicidal ideation. Plan: Continue pramipexole 0.25 mg BID Sertraline 200 daily Switch trazodone bc doesn't stay asleep to hydroxyzine 25-50 mg HS..  Trial Quiviviq 50 mg HS Switch to decaf by 3 PM.  Emphasized this  He's done this  02/11/2022  appointment with the following noted: Can't get anything to help stay asleep.  Trazodone best so far bc without hangover. Not sure how much sleep.  Varies.  Last week 3 AM wide awake. Anxiety is higher than it was and more irritable without trigger.  Hard to shut off mind at night. Plan: Yes trial Restoril 15 mg 1-2 HS for EMA  04/14/22 appt noted: On temazepam  30 mg HS and off trazodone. On sertraline 200 mg  and pramipexole 0.25 mg BID and Sinemet. Still sleep px avg 3 hours.  Sleep terrible.  Trouble getting back to sleep. Back pain will awaken him and goes to LR and into chair.  Not always the cause.  No pain meds. Work up pending with hx fusion.  Also has nausea. 25th wedding anniversary and to [place] this week. Son 3 strokes as child from heart px as a kid. Plan: Yes trial clonazepam 0.5-1.0 mg HS Good to have switched DT recent dx PD.  Disc pramipexole in reference to dx PD.  Dr. Sherryll Burger ok with it's low dose  added on for off-label treatment of depression. Continue pramipexole 0.25 mg BID Sertraline 200 daily  07/01/2022 appointment noted: Continues above meds.  Also on Sinemet. Taking clonazepam 0.5 mg HS for sleep.  No hangover; no SE.  Still trouble staying asleep and EMA.  Really bad.  To bed 11 and up 230 for couple hours.  The next day not overy tired or sleep  4 hours at night without naps.Marland Kitchen  asks to increase clonazepam. Sometimes fuse a little shorter than should be but recognizes it and can manage it. Not sig depressed noor anxious Can tell PD is progressing somewhat.  Some dystonia at night with cramps at night.  Also more right tremor. Handling it with grace and information.  10/02/22 appt noted: Pretty good except still dealing with severe neck pain for a year.  Sees neuro for PD.  Tried lots of med and PT.  C spine MRI is abnormal.  First injection wihtout help.  Likely NS referral.  Pain interferes with sleep. Meds sertraline 200, clonazepam 1.0 mg HS,  pramipexole 0.25 BID with PD. Mood dysphoric over the chronic pain but otherwise satisfied with current meds. No SE with meds.   Plan: Yes Nortriptyline 10 mg HS for neck pain.    03/03/23 appt noted: 3/4 Cervical fusion and didn't relieve pain.  And may need surg again on higher level.   Meds as above and nortriptyline 10 HS, and Rexulti 2 mg daily added. Ongoing neck pain and work up.   Knee pain too.  Hx a number of ortho surgeries.   Mood is more stable with Rexulti.   Sometimes too short tempered and knows he is doing it.  No outbursts of anger but can have time periods of agitation and sometimes without a reason.  Wife will notice it.  Probably for a couple of mos.   PD sx are generally mild.   Still some sleep benefit with clonazepam 1 mg HS but not as much as before. Avg 5 hour sleep, 7 more typical.  Pain can interfere.  Not always pain.   Not napping during the day. Asks about Cymbalta Plan: DC nortriptyline bc not helpful and polypharmacy. Plan: Stop nortriptyline. Reduce sertraline to 1 and 1/2 tablets daily and start duloxetine 1 daily for 5-7 days, Then reduce sertraline to 1 daily and increase duloxetine to 2 daily for 5/7 days, Then reduce sertraline 1/2 daily and increase duloxetine 3 daily for 5-7 days, Then stop sertraline. If it helps then stop the Rexulti. Increase clonazepam 1.5 mg HS but after a week if not better then reduce to 1 mg HS.  05/05/23 appt noted: Switched sertraline to duloxetine 90 mg  to see if it could help pain.  No problems with duloxetine except more sweating.  Always sweating pretty bad but worse.   Pain is coming back after cervical injection.  Still tingling side of face too. Stopped Rexulti with some worsening of emotional stability that wife notices. Some worsening of depression or anxiety but some anxiety over special needs son in senior year and his future makes him anxious and worries over it.    When anxious Parkinson's tremor gets worse.   Will get tearful over this, gets overwhelmed bc don't know the answer.   Never tried clonazepam 1.5 mg HS and still has EMA 3 AM with 90 min awake time until back to sleep. No cause for awakening.   No SE with meds.   Neck pain 4-5/10.  No sig low  back pain.  Not sure if emotions dulled with Zoloft but felt maybe it wasn't helping as much as in the past.  Hard to tell if more emotionality is related to med change or situation.    Past Psychiatric Medication Trials:  Sertraline 200, venlafaxine 225, paroxetine, duloxetine 90 sweating. Abilify 10, Rexulti 2,  Pramipexole 0.25 mg BID buspirone 30 twice daily,  doxazosin 8,   Sonata ? effect, trazodone 200 EMA, hydroxyzine 25 helped a little. hangover, Belsomra hangover, Quvivik NR & hangover, Davigo NR, temazepam 30 NR Temazepam NR Clonazepam 1 mg HS no SE min response  Review of Systems:  Review of Systems  Cardiovascular:  Negative for palpitations.  Gastrointestinal:  Negative for abdominal pain.  Genitourinary:  Positive for frequency.  Musculoskeletal:  Positive for back pain and neck pain.  Neurological:  Positive for tremors. Negative for dizziness, weakness and headaches.       Balance and some stiffness  Psychiatric/Behavioral:  Positive for sleep disturbance. Negative for dysphoric mood. The patient is not nervous/anxious.     Medications: I have reviewed the patient's current medications.  Current Outpatient Medications  Medication Sig Dispense Refill   carbidopa-levodopa (SINEMET IR) 25-100 MG tablet Take 1 tablet by mouth 3 (three) times daily.     DULoxetine (CYMBALTA) 30 MG capsule Take 3 capsules (90 mg total) by mouth daily. 90 capsule 0   gabapentin (NEURONTIN) 300 MG capsule Take 600 mg by mouth 3 (three) times daily.     ondansetron (ZOFRAN-ODT) 4 MG disintegrating tablet Take 1 tablet (4 mg total) by mouth every 6 (six) hours as needed for nausea or vomiting. 20 tablet 0   pramipexole (MIRAPEX) 0.25 MG tablet  Take 1 tablet (0.25 mg total) by mouth 2 (two) times daily. 180 tablet 0   rosuvastatin (CRESTOR) 20 MG tablet Take 20 mg by mouth daily.     brexpiprazole (REXULTI) 2 MG TABS tablet Take 1 tablet (2 mg total) by mouth daily. (Patient not taking: Reported on 05/05/2023) 30 tablet 1   clonazePAM (KLONOPIN) 1 MG tablet Take 1.5 tablets (1.5 mg total) by mouth at bedtime. (Patient not taking: Reported on 05/05/2023) 45 tablet 1   dicyclomine (BENTYL) 20 MG tablet Take 1 tablet (20 mg total) by mouth 4 (four) times daily -  before meals and at bedtime for 10 days. 40 tablet 0   No current facility-administered medications for this visit.    Medication Side Effects: None  Allergies:  Allergies  Allergen Reactions   Adhesive [Tape] Other (See Comments)    Blisters    Codeine Nausea And Vomiting   Latex Other (See Comments)    blisters    Past Medical History:  Diagnosis Date   Anxiety    Anxiety    Arthritis    Depression    Hyperlipidemia    Insomnia    Lymphadenopathy, retroperitoneal    OSA (obstructive sleep apnea)    Parkinson disease    Sciatica    Seizures (HCC)    Spinal stenosis    Spinal stenosis    Spondylisthesis     History reviewed. No pertinent family history.  Social History   Socioeconomic History   Marital status: Married    Spouse name: Not on file   Number of children: Not on file   Years of education: Not on file   Highest education level: Not on file  Occupational History   Not on file  Tobacco Use   Smoking status: Never  Smokeless tobacco: Never  Vaping Use   Vaping status: Never Used  Substance and Sexual Activity   Alcohol use: Yes    Alcohol/week: 2.0 standard drinks of alcohol    Types: 2 Cans of beer per week    Comment: NONE LAST 24 HRS   Drug use: No   Sexual activity: Yes  Other Topics Concern   Not on file  Social History Narrative   Not on file   Social Determinants of Health   Financial Resource Strain: Not on file   Food Insecurity: Not on file  Transportation Needs: Not on file  Physical Activity: Not on file  Stress: Not on file  Social Connections: Not on file  Intimate Partner Violence: Not on file    Past Medical History, Surgical history, Social history, and Family history were reviewed and updated as appropriate.   Please see review of systems for further details on the patient's review from today.   Objective:   Physical Exam:  There were no vitals taken for this visit.  Physical Exam Constitutional:      General: He is not in acute distress.    Appearance: He is well-developed. He is obese.  Musculoskeletal:        General: No deformity.  Neurological:     Mental Status: He is alert and oriented to person, place, and time.     Coordination: Coordination normal.  Psychiatric:        Attention and Perception: Attention and perception normal. He does not perceive auditory or visual hallucinations.        Mood and Affect: Mood is anxious. Mood is not depressed. Affect is not labile, angry, tearful or inappropriate.        Speech: Speech normal.        Behavior: Behavior normal.        Thought Content: Thought content normal. Thought content is not delusional. Thought content does not include homicidal or suicidal ideation. Thought content does not include suicidal plan.        Cognition and Memory: Cognition and memory normal.        Judgment: Judgment normal.     Comments: Insight intact. No delusions.  Not sig depressed but more emotional.   talkative     Lab Review:     Component Value Date/Time   NA 136 08/31/2022 2334   K 3.4 (L) 08/31/2022 2334   CL 101 08/31/2022 2334   CO2 26 08/31/2022 2334   GLUCOSE 106 (H) 08/31/2022 2334   BUN 12 08/31/2022 2334   CREATININE 1.07 08/31/2022 2334   CALCIUM 8.9 08/31/2022 2334   PROT 7.4 08/31/2022 2334   ALBUMIN 4.3 08/31/2022 2334   AST 23 08/31/2022 2334   ALT 6 08/31/2022 2334   ALKPHOS 65 08/31/2022 2334   BILITOT  1.0 08/31/2022 2334   GFRNONAA >60 08/31/2022 2334       Component Value Date/Time   WBC 6.8 08/31/2022 2334   RBC 4.30 08/31/2022 2334   HGB 13.0 08/31/2022 2334   HCT 39.5 08/31/2022 2334   PLT 168 08/31/2022 2334   MCV 91.9 08/31/2022 2334   MCH 30.2 08/31/2022 2334   MCHC 32.9 08/31/2022 2334   RDW 13.0 08/31/2022 2334   LYMPHSABS 0.6 (L) 08/31/2022 2334   MONOABS 0.7 08/31/2022 2334   EOSABS 0.2 08/31/2022 2334   BASOSABS 0.1 08/31/2022 2334    No results found for: "POCLITH", "LITHIUM"   No results found for: "PHENYTOIN", "PHENOBARB", "VALPROATE", "CBMZ"   .  res Assessment: Plan:    Dakota Davis" was seen today for follow-up, depression, anxiety and medication reaction.  Diagnoses and all orders for this visit:  Depression, major, recurrent, in partial remission (HCC)  Generalized anxiety disorder  PTSD (post-traumatic stress disorder)  Insomnia due to mental condition  Primary parkinsonism  Neck pain     43 min face to face time with patient was spent on counseling and coordination of care.  Mood and anxiety are more intense with switch to duloxetine 90 mg daily but is dealing with emotions over son and not sure if it is worse or not.    Discussed potential metabolic side effects associated with atypical antipsychotics, as well as potential risk for movement side effects. Advised pt to contact office if movement side effects occur.  responded to Rexulti 2 mg with resolution of sx. Had to switch to pramipexole for cost reasonsbut now able to get Rexulti which  worked better and he's been able to get it again and pleased with it.   Overall mood is more stable with Rexulti 2 mg and wants to continue it..  We discussed the short-term risks associated with benzodiazepines including sedation and increased fall risk among others.  Discussed long-term side effect risk including dependence, potential withdrawal symptoms, and the potential eventual dose-related risk  of dementia.  But recent studies from 2020 dispute this association between benzodiazepines and dementia risk. Newer studies in 2020 do not support an association with dementia. Disc Bz risk with sleep bc anxiety with it and doesn't feel well and on max SSRI.  Disc pros and cons given failure of non-BZ for sleep.    Good to have switched DT recent dx PD.  Disc pramipexole in reference to dx PD.  Dr. Sherryll Burger ok with it's low dose added on for off-label treatment of depression. Continue pramipexole 0.25 mg BID  Reduce duloxetine to 60 mg to get rid of sweating and reevaluate mood and anxiety at FU  Resume Rexulti bc emotions less stable off of it 2 mg daily.  Increase clonazepam 1.5 mg HS but after a week if not better then reduce to 1 mg HS to deal with EMA  decaf by 3 PM and He's done this   Disc SE in detail and SSRI withdrawal sx.  Supportive therapy dealing with son's future and fear of the future.    Supportive therapy dealing with neck  pain.    FU 2-3 mos.  Meredith Staggers, MD, DFAPA    Please see After Visit Summary for patient specific instructions.  Future Appointments  Date Time Provider Department Center  05/05/2023  4:15 PM Precious Bard, PT [facility] None  05/07/2023  8:00 AM Thresa Ross B, PT [facility] None  05/11/2023  9:30 AM Precious Bard, PT [facility] None  05/13/2023  8:00 AM Thresa Ross B, PT [facility] None  05/20/2023  8:00 AM Thresa Ross B, PT [facility] None  05/25/2023  8:00 AM Thresa Ross B, PT [facility] None  05/27/2023  8:00 AM Thresa Ross B, PT [facility] None  06/01/2023  8:00 AM Thresa Ross B, PT [facility] None  06/03/2023  8:00 AM Thresa Ross B, PT [facility] None  06/08/2023  8:00 AM Thresa Ross B, PT [facility] None  06/11/2023  8:00 AM Thresa Ross B, PT [facility] None  06/15/2023  8:00 AM Thresa Ross B, PT [facility] None  06/17/2023  8:00 AM Thresa Ross B, PT [facility] None  06/22/2023  8:00 AM Norman Herrlich, PT [facility] None  06/24/2023  8:00 AM Thresa Ross B, PT [facility] None  06/29/2023  8:00 AM Thresa Ross B, PT [facility] None  07/01/2023  8:00 AM Thresa Ross B, PT [facility] None  07/06/2023  8:00 AM Thresa Ross B, PT [facility] None  07/08/2023  8:00 AM Thresa Ross B, PT [facility] None     No orders of the defined types were placed in this encounter.    -------------------------------

## 2023-05-05 NOTE — Patient Instructions (Signed)
Increase clonazepam to 1.5 mg nightly to improve sleep Resume Rexulti 2 mg tablet for depression and emotional stability

## 2023-05-07 ENCOUNTER — Ambulatory Visit: Payer: BC Managed Care – PPO | Admitting: Physical Therapy

## 2023-05-07 NOTE — Therapy (Signed)
OUTPATIENT PHYSICAL THERAPY NEURO TREATMENT   Patient Name: Dakota Davis MRN: [id number] DOB:[birth year], 57 y.o., male Today's Date: 05/11/2023   PCP: Lauro Regulus  REFERRING PROVIDER: Cristopher Peru K MD   END OF SESSION:  PT End of Session - 05/11/23 0930     Visit Number 5    Number of Visits 24    Date for PT Re-Evaluation 07/08/23    PT Start Time 0930    PT Stop Time 1014    PT Time Calculation (min) 44 min    Equipment Utilized During Treatment Gait belt    Activity Tolerance Patient tolerated treatment well    Behavior During Therapy WFL for tasks assessed/performed                 Past Medical History:  Diagnosis Date   Anxiety    Anxiety    Arthritis    Depression    Hyperlipidemia    Insomnia    Lymphadenopathy, retroperitoneal    OSA (obstructive sleep apnea)    Parkinson disease    Sciatica    Seizures (HCC)    Spinal stenosis    Spinal stenosis    Spondylisthesis    Past Surgical History:  Procedure Laterality Date   BACK SURGERY     BACK SURGERY     10/31/2016,12/21/2015   CARDIAC CATHETERIZATION     ESOPHAGOGASTRODUODENOSCOPY N/A 11/27/2021   Procedure: ESOPHAGOGASTRODUODENOSCOPY (EGD);  Surgeon: Toledo, Boykin Nearing, MD;  Location: [facility];  Service: Gastroenterology;  Laterality: N/A;   KNEE ARTHROSCOPY     2018   LEFT HEART CATH AND CORONARY ANGIOGRAPHY N/A 08/05/2017   Procedure: LEFT HEART CATH AND CORONARY ANGIOGRAPHY;  Surgeon: Lamar Blinks, MD;  Location: [facility];  Service: Cardiovascular;  Laterality: N/A;   TONSILECTOMY/ADENOIDECTOMY WITH MYRINGOTOMY     Patient Active Problem List   Diagnosis Date Noted   GAD (generalized anxiety disorder) 07/04/2018   PTSD (post-traumatic stress disorder) 07/04/2018   Stable angina 08/03/2017    ONSET DATE: 2022  REFERRING DIAG: Parkinson's Disease  THERAPY DIAG:  Difficulty in walking, not elsewhere classified  Unsteadiness on feet  Rationale  for Evaluation and Treatment: Rehabilitation  SUBJECTIVE:                                                                                                                                                                                             SUBJECTIVE STATEMENT: Patient reports having a chore filled weekend.    Pt accompanied by: self  PERTINENT HISTORY:   Patient presents to PT for evaluation of Parkinson's Disease. He has  additional L shoulder pain s/p ACDF 11/2022. His Parkinson's has L sided resting tremor with some bilateral tremors. He has limited R knee ROM at baseline. PMH includes Depression, anxiety, arthritis, bilateral sciatica, insomnia, obesity, OSA, seizures, spinal stenosis, spondylolisthesis at L4-5, PTSD, and lymphadenopathy retroperitoneal. Patient reports primary issue on L side, difficulty picking L foot up, drop foot of L foot and heel, stumbles but no falls. Pivots are challenging. Has Dystonia with L foot, starting in R side in past two weeks. 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  PAIN:  Are you having pain?  Dystonia of L foot   PRECAUTIONS: Fall   WEIGHT BEARING RESTRICTIONS: No  FALLS: Has patient fallen in last 6 months?  Stumbles but no falls to ground.   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 Prior C4-C6 ACDF without evidence of hardware complication. No residual high-grade stenosis. 2. Unchanged multilevel cervical spondylosis as described above. Unchanged moderate to severe right neuroforaminal stenosis at  C3-C4. Unchanged mild left neuroforaminal stenosis at C2-C3 and C3-C4.   COGNITION: Overall cognitive status: Within functional limits for tasks assessed   SENSATION: WFL  COORDINATION: Heel slide: L slower Finger to nose test: L dysmetria and slower speed   POSTURE: rounded shoulders, increased thoracic kyphosis, and weight shift right   LOWER EXTREMITY MMT:    MMT Right Eval Left Eval  Hip flexion 5 4  Hip abduction 4 4-  Hip adduction 5 4  Knee flexion 5 4-  Knee extension 5 4-  Ankle dorsiflexion 5 4  Ankle plantarflexion 5 4  (Blank rows = not tested)    TRANSFERS: Assistive device utilized: None  Sit to stand: SBA Stand to sit: SBA   STAIRS: Level of Assistance: SBA Stair Negotiation Technique: Alternating Pattern  with Single Rail on Right Number of Stairs: 4  Height of Stairs: 6 inch  Comments: decreased clearance of LLE  GAIT: Gait pattern:    decreased L ankle DF, R trunk lean during R stance, limited L arm swing, narrow BOS, B decreased step length Distance walked: 1050' Assistive device utilized: None Level of assistance: CGA Comments: see above; L foot collapse with weightbearing.   FUNCTIONAL TESTS:  5 times sit to stand: 14.7 6 minute walk test: 1025 Functional gait assessment: 17/30  PATIENT SURVEYS:  FOTO 60  TODAY'S TREATMENT:                                                                                                                              DATE: 05/11/23   Neuro  Re-ed: Bosu ball:   Flat side up: -static stand 60 seconds -mini squats 10x   -lateral weight shift 10x   Activity Description: red-right hand/foot, green - left hand/foot standing on airex pad; 3 on floor 3 on table  Activity Setting:  The Blaze Pod Random setting was chosen to enhance cognitive processing and agility, providing an unpredictable environment to simulate real-world scenarios, and fostering quick reactions and adaptability.   Number of Pods:   6 Cycles/Sets:  3 Duration (Time or Hit Count):  30 seconds  Patient Stats   Hits:   22,22, 25   Airex pad: -single limb stance 30 seconds x 2 trials each LE  -tandem stance 10 tosses each foot position throwing ball with PT   TherEx: Sit to stand with propulsion jump 10  Incline board stretch 30 seconds, no hands 30 seconds  RTB ER 10x, 2 sets RTB DF 10x   Toe walking in hallway 41ft Heel walking in hallway 30 ft Karaoke 60 ft    PATIENT EDUCATION: Education details: goals, POC Person educated: Patient Education method: Explanation, Demonstration, Tactile cues, and Verbal cues Education comprehension: verbalized understanding, returned demonstration, verbal cues required, and tactile cues required  HOME EXERCISE PROGRAM: Access Code: [email or web address] URL: [email or web address] Date: 04/27/2023 Prepared by: Precious Bard  Exercises - Single Leg Stance with Support  - 1 x daily - 7 x weekly - 2 sets - 2 reps - 30 hold - Seated Ankle Alphabet  - 1 x daily - 7 x weekly - 2 sets - 1 reps - 5 hold - Forward T with Counter Support  - 1 x daily - 7 x weekly - 2 sets - 10 reps - 5 hold  GOALS: Goals reviewed with patient? Yes  SHORT TERM GOALS: Target date: 05/13/2023    Patient will be independent in home exercise program to improve strength/mobility for better functional independence with ADLs. Baseline:7/31: give next session Goal status: INITIAL    LONG TERM GOALS: Target date: 07/08/2023   Patient will increase FOTO score to equal to or greater than  70%   to demonstrate statistically significant improvement in mobility and quality of life.  Baseline: 7/31: 60% Goal status: INITIAL  2.  Patient will increase Functional Gait Assessment score to >28/30 as to reduce fall risk and improve dynamic gait safety with community ambulation. Baseline: 7/31: 17/30 Goal status: INITIAL  3.  Patient will increase six minute walk test distance to >1500 for  progression to age norm community ambulator and improve gait ability Baseline: 7/31: 1025 ft  Goal status: INITIAL  4.  Patient will return to independent walking and gym program for return to PLOF. Baseline: 7/31: not consisently walking, not going to gym.  Goal status: INITIAL    ASSESSMENT:  CLINICAL IMPRESSION:  Patient tolerates progressive single leg stability interventions.  His ankles are continuing to be unstable on unstable surfaces with significant tremors and ankle righting reactions. Has an appointment on Sept 4th for consult for ablation of cervical spine. Patient will benefit from skilled physical therapy to increase stability and mobility for return to PLOF.   OBJECTIVE IMPAIRMENTS: Abnormal gait, decreased activity tolerance, decreased balance, decreased coordination, decreased endurance, decreased mobility, difficulty walking, decreased strength, impaired flexibility, improper body mechanics, postural dysfunction, and pain.   ACTIVITY LIMITATIONS: carrying, lifting, bending, standing, squatting, stairs, transfers, dressing, locomotion level, and caring for others  PARTICIPATION LIMITATIONS: meal prep, cleaning, laundry, shopping, community activity, occupation, and yard work  PERSONAL FACTORS: Age, Fitness,  Past/current experiences, Time since onset of injury/illness/exacerbation, and 3+ comorbidities: Depression, anxiety, arthritis, bilateral sciatica, insomnia, obesity, OSA, seizures, spinal stenosis, spondylolisthesis at L4-5, PTSD, and lymphadenopathy retroperitoneal  are also affecting patient's functional outcome.   REHAB POTENTIAL: Good  CLINICAL DECISION MAKING: Evolving/moderate complexity  EVALUATION COMPLEXITY: Moderate  PLAN:  PT FREQUENCY: 2x/week  PT DURATION: 12 weeks  PLANNED INTERVENTIONS: Therapeutic exercises, Therapeutic activity, Neuromuscular re-education, Balance training, Gait training, Patient/Family education, Self Care, Joint  mobilization, Stair training, Vestibular training, Canalith repositioning, Visual/preceptual remediation/compensation, Orthotic/Fit training, Dry Needling, Electrical stimulation, Spinal mobilization, Cryotherapy, Moist heat, Splintting, Taping, Traction, Manual therapy, and Re-evaluation  PLAN FOR NEXT SESSION: dual task, L foot stabilization, ankle righting reactions    Precious Bard, PT 05/11/2023, 10:15 AM

## 2023-05-11 ENCOUNTER — Ambulatory Visit: Payer: BC Managed Care – PPO

## 2023-05-11 DIAGNOSIS — R262 Difficulty in walking, not elsewhere classified: Secondary | ICD-10-CM | POA: Diagnosis not present

## 2023-05-11 DIAGNOSIS — R2681 Unsteadiness on feet: Secondary | ICD-10-CM

## 2023-05-13 ENCOUNTER — Ambulatory Visit: Payer: BC Managed Care – PPO | Admitting: Physical Therapy

## 2023-05-13 DIAGNOSIS — R2681 Unsteadiness on feet: Secondary | ICD-10-CM

## 2023-05-13 DIAGNOSIS — R262 Difficulty in walking, not elsewhere classified: Secondary | ICD-10-CM | POA: Diagnosis not present

## 2023-05-13 NOTE — Therapy (Signed)
OUTPATIENT PHYSICAL THERAPY NEURO TREATMENT   Patient Name: Dakota Davis MRN: [id number] DOB:[birth year], 57 y.o., male Today's Date: 05/13/2023   PCP: Lauro Regulus  REFERRING PROVIDER: Lonell Face MD   END OF SESSION:  PT End of Session - 05/13/23 0813     Visit Number 6    Number of Visits 24    Date for PT Re-Evaluation 07/08/23    Progress Note Due on Visit 10    PT Start Time 0807    PT Stop Time 0844    PT Time Calculation (min) 37 min    Equipment Utilized During Treatment Gait belt    Activity Tolerance Patient tolerated treatment well    Behavior During Therapy WFL for tasks assessed/performed                  Past Medical History:  Diagnosis Date   Anxiety    Anxiety    Arthritis    Depression    Hyperlipidemia    Insomnia    Lymphadenopathy, retroperitoneal    OSA (obstructive sleep apnea)    Parkinson disease    Sciatica    Seizures (HCC)    Spinal stenosis    Spinal stenosis    Spondylisthesis    Past Surgical History:  Procedure Laterality Date   BACK SURGERY     BACK SURGERY     10/31/2016,12/21/2015   CARDIAC CATHETERIZATION     ESOPHAGOGASTRODUODENOSCOPY N/A 11/27/2021   Procedure: ESOPHAGOGASTRODUODENOSCOPY (EGD);  Surgeon: Toledo, Boykin Nearing, MD;  Location: [facility];  Service: Gastroenterology;  Laterality: N/A;   KNEE ARTHROSCOPY     2018   LEFT HEART CATH AND CORONARY ANGIOGRAPHY N/A 08/05/2017   Procedure: LEFT HEART CATH AND CORONARY ANGIOGRAPHY;  Surgeon: Lamar Blinks, MD;  Location: [facility];  Service: Cardiovascular;  Laterality: N/A;   TONSILECTOMY/ADENOIDECTOMY WITH MYRINGOTOMY     Patient Active Problem List   Diagnosis Date Noted   GAD (generalized anxiety disorder) 07/04/2018   PTSD (post-traumatic stress disorder) 07/04/2018   Stable angina 08/03/2017    ONSET DATE: 2022  REFERRING DIAG: Parkinson's Disease  THERAPY DIAG:  Difficulty in walking, not elsewhere  classified  Unsteadiness on feet  Rationale for Evaluation and Treatment: Rehabilitation  SUBJECTIVE:                                                                                                                                                                                             SUBJECTIVE STATEMENT: Patient reports having a busy week with work.    Pt accompanied by: self  PERTINENT HISTORY:  Patient presents to PT for evaluation of Parkinson's Disease. He has additional L shoulder pain s/p ACDF 11/2022. His Parkinson's has L sided resting tremor with some bilateral tremors. He has limited R knee ROM at baseline. PMH includes Depression, anxiety, arthritis, bilateral sciatica, insomnia, obesity, OSA, seizures, spinal stenosis, spondylolisthesis at L4-5, PTSD, and lymphadenopathy retroperitoneal. Patient reports primary issue on L side, difficulty picking L foot up, drop foot of L foot and heel, stumbles but no falls. Pivots are challenging. Has Dystonia with L foot, starting in R side in past two weeks. 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  PAIN:  Are you having pain?  Dystonia of L foot   PRECAUTIONS: Fall   WEIGHT BEARING RESTRICTIONS: No  FALLS: Has patient fallen in last 6 months?  Stumbles but no falls to ground.   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 Prior C4-C6 ACDF without evidence of hardware complication. No residual high-grade stenosis. 2. Unchanged multilevel cervical spondylosis as described above. Unchanged moderate  to severe right neuroforaminal stenosis at C3-C4. Unchanged mild left neuroforaminal stenosis at C2-C3 and C3-C4.   COGNITION: Overall cognitive status: Within functional limits for tasks assessed   SENSATION: WFL  COORDINATION: Heel slide: L slower Finger to nose test: L dysmetria and slower speed   POSTURE: rounded shoulders, increased thoracic kyphosis, and weight shift right   LOWER EXTREMITY MMT:    MMT Right Eval Left Eval  Hip flexion 5 4  Hip abduction 4 4-  Hip adduction 5 4  Knee flexion 5 4-  Knee extension 5 4-  Ankle dorsiflexion 5 4  Ankle plantarflexion 5 4  (Blank rows = not tested)    TRANSFERS: Assistive device utilized: None  Sit to stand: SBA Stand to sit: SBA   STAIRS: Level of Assistance: SBA Stair Negotiation Technique: Alternating Pattern  with Single Rail on Right Number of Stairs: 4  Height of Stairs: 6 inch  Comments: decreased clearance of LLE  GAIT: Gait pattern:    decreased L ankle DF, R trunk lean during R stance, limited L arm swing, narrow BOS, B decreased step length Distance walked: 1050' Assistive device utilized: None Level of assistance: CGA Comments: see above; L foot collapse with weightbearing.   FUNCTIONAL TESTS:  5 times sit to stand: 14.7 6 minute walk test: 1025 Functional gait assessment: 17/30  PATIENT SURVEYS:  FOTO 60  TODAY'S TREATMENT:  DATE: 05/13/23   Neuro Re-ed:  Dakota Davis fitness x 6 min level 2 for aerobic priming as well as reciprocal movement training   Started with PWR! Introduction to standing exercises. * PWR! Up, rock, twist, step x 10 ea and on ea side when indicated.   Transitioned to performing all standing PWR! Moves on airex beam working on both frontal plane and sagital plane balance challenges across the various PWR! Moves. X 10 ea, cues for maintaining  appropriate foot placement for optimal balance.    TherEx:    RTB ER 15x, 2 sets RTB DF 15x    Pt session was cut a little short today due to pt arriving late to scheduled appointment time   PATIENT EDUCATION: Education details: goals, POC Person educated: Patient Education method: Explanation, Demonstration, Tactile cues, and Verbal cues Education comprehension: verbalized understanding, returned demonstration, verbal cues required, and tactile cues required  HOME EXERCISE PROGRAM: Access Code: [email or web address] URL: [email or web address] Date: 04/27/2023 Prepared by: Precious Bard  Exercises - Single Leg Stance with Support  - 1 x daily - 7 x weekly - 2 sets - 2 reps - 30 hold - Seated Ankle Alphabet  - 1 x daily - 7 x weekly - 2 sets - 1 reps - 5 hold - Forward T with Counter Support  - 1 x daily - 7 x weekly - 2 sets - 10 reps - 5 hold  GOALS: Goals reviewed with patient? Yes  SHORT TERM GOALS: Target date: 05/13/2023    Patient will be independent in home exercise program to improve strength/mobility for better functional independence with ADLs. Baseline:7/31: give next session Goal status: INITIAL    LONG TERM GOALS: Target date: 07/08/2023   Patient will increase FOTO score to equal to or greater than  70%   to demonstrate statistically significant improvement in mobility and quality of life.  Baseline: 7/31: 60% Goal status: INITIAL  2.  Patient will increase Functional Gait Assessment score to >28/30 as to reduce fall risk and improve dynamic gait safety with community ambulation. Baseline: 7/31: 17/30 Goal status: INITIAL  3.  Patient will increase six minute walk test distance to >1500 for progression to age norm community ambulator and improve gait ability Baseline: 7/31: 1025 ft  Goal status: INITIAL  4.  Patient will return to independent walking and gym program for return to PLOF. Baseline: 7/31: not consisently walking, not going to gym.   Goal status: INITIAL    ASSESSMENT:  CLINICAL IMPRESSION:  Patient arrived with good motivation form completion of pt activities.  Pt introduced to PWR! Moves in standing to address posture, weight shift and balance, and transitions then was progressed to challenge high level balance with all of the standing PWR! Moves. Pt tolerated well and also felt the octane fitness reciprocal movent to start session loosened up his back some. Pt provided with PWR! Standing handout and was instructed in importance of each move to address different aspects of PD than typically progress with the disease over time. Pt will continue to benefit from skilled physical therapy intervention to address impairments, improve QOL, and attain therapy goals.    OBJECTIVE IMPAIRMENTS: Abnormal gait, decreased activity tolerance, decreased balance, decreased coordination, decreased endurance, decreased mobility, difficulty walking, decreased strength, impaired flexibility, improper body mechanics, postural dysfunction, and pain.   ACTIVITY LIMITATIONS: carrying, lifting, bending, standing, squatting, stairs, transfers, dressing, locomotion level, and caring for others  PARTICIPATION LIMITATIONS: meal prep, cleaning, laundry, shopping, community activity, occupation, and yard work  PERSONAL FACTORS: Age, Fitness, Past/current experiences, Time since onset of injury/illness/exacerbation, and 3+ comorbidities: Depression, anxiety, arthritis, bilateral sciatica, insomnia, obesity, OSA, seizures, spinal stenosis, spondylolisthesis at L4-5, PTSD, and lymphadenopathy retroperitoneal  are also affecting patient's functional outcome.   REHAB POTENTIAL: Good  CLINICAL DECISION MAKING: Evolving/moderate complexity  EVALUATION COMPLEXITY: Moderate  PLAN:  PT FREQUENCY: 2x/week  PT DURATION: 12 weeks  PLANNED INTERVENTIONS: Therapeutic exercises, Therapeutic activity, Neuromuscular re-education, Balance training, Gait  training, Patient/Family education, Self Care, Joint mobilization, Stair training, Vestibular training, Canalith repositioning, Visual/preceptual remediation/compensation, Orthotic/Fit training, Dry Needling, Electrical stimulation, Spinal mobilization, Cryotherapy, Moist heat, Splintting, Taping, Traction, Manual therapy, and Re-evaluation  PLAN FOR NEXT SESSION: dual task, L foot stabilization, ankle righting reactions    Norman Herrlich, PT 05/13/2023, 8:14 AM

## 2023-05-19 ENCOUNTER — Telehealth: Payer: Self-pay | Admitting: Psychiatry

## 2023-05-19 NOTE — Telephone Encounter (Signed)
Dakota Davis stating that he had issues with the med change Cymbalta. He is requesting to titrate off Cymbalta and return on the Sertraline.  He is requesting a return to discuss the issues. Appointment 07/08/23 Contact # [phone]

## 2023-05-19 NOTE — Telephone Encounter (Signed)
At last visit a couple of weeks ago - Reduce duloxetine to 60 mg to get rid of sweating and reevaluate mood and anxiety at FU   Sweating is still an issue and patient feels like it has disrupted his sleep pattern. He doesn't feel like he is getting any relief from his neck pain, or any other benefit.  He is requesting to go back to sertraline and said that when it was used in combination with Rexulti he and his wife noticed an improvement in mood and anxiety.

## 2023-05-19 NOTE — Telephone Encounter (Signed)
Stop duloxetine and resume sertraline at 1 and 1/2 of the 200 mg tablets. Continue the Rexulti we restarted at last visist.

## 2023-05-19 NOTE — Telephone Encounter (Signed)
LVM to RC 

## 2023-05-20 ENCOUNTER — Encounter: Payer: Self-pay | Admitting: Physical Therapy

## 2023-05-20 ENCOUNTER — Ambulatory Visit: Payer: BC Managed Care – PPO | Attending: Neurology | Admitting: Physical Therapy

## 2023-05-20 DIAGNOSIS — R2681 Unsteadiness on feet: Secondary | ICD-10-CM | POA: Diagnosis present

## 2023-05-20 DIAGNOSIS — R262 Difficulty in walking, not elsewhere classified: Secondary | ICD-10-CM | POA: Diagnosis present

## 2023-05-20 NOTE — Telephone Encounter (Signed)
Notified patient of recommendations.  He asked that Rx for sertraline be sent to [facility] and this was sent.

## 2023-05-20 NOTE — Therapy (Signed)
OUTPATIENT PHYSICAL THERAPY NEURO TREATMENT   Patient Name: Dakota Davis MRN: [id number] DOB:11/13/1965, 57 y.o., male Today's Date: 05/20/2023   PCP: Lauro Regulus  REFERRING PROVIDER: Lonell Face MD   END OF SESSION:  PT End of Session - 05/20/23 0813     Visit Number 7    Number of Visits 24    Date for PT Re-Evaluation 07/08/23    Progress Note Due on Visit 10    Equipment Utilized During Treatment Gait belt    Activity Tolerance Patient tolerated treatment well    Behavior During Therapy [facility] for tasks assessed/performed                   Past Medical History:  Diagnosis Date   Anxiety    Anxiety    Arthritis    Depression    Hyperlipidemia    Insomnia    Lymphadenopathy, retroperitoneal    OSA (obstructive sleep apnea)    Parkinson disease    Sciatica    Seizures (HCC)    Spinal stenosis    Spinal stenosis    Spondylisthesis    Past Surgical History:  Procedure Laterality Date   BACK SURGERY     BACK SURGERY     10/31/2016,12/21/2015   CARDIAC CATHETERIZATION     ESOPHAGOGASTRODUODENOSCOPY N/A 11/27/2021   Procedure: ESOPHAGOGASTRODUODENOSCOPY (EGD);  Surgeon: Toledo, Boykin Nearing, MD;  Location: [facility] ENDOSCOPY;  Service: Gastroenterology;  Laterality: N/A;   KNEE ARTHROSCOPY     2018   LEFT HEART CATH AND CORONARY ANGIOGRAPHY N/A 08/05/2017   Procedure: LEFT HEART CATH AND CORONARY ANGIOGRAPHY;  Surgeon: Lamar Blinks, MD;  Location: [facility] INVASIVE CV LAB;  Service: Cardiovascular;  Laterality: N/A;   TONSILECTOMY/ADENOIDECTOMY WITH MYRINGOTOMY     Patient Active Problem List   Diagnosis Date Noted   GAD (generalized anxiety disorder) 07/04/2018   PTSD (post-traumatic stress disorder) 07/04/2018   Stable angina 08/03/2017    ONSET DATE: 2022  REFERRING DIAG: Parkinson's Disease  THERAPY DIAG:  Difficulty in walking, not elsewhere classified  Unsteadiness on feet  Rationale for Evaluation and Treatment:  Rehabilitation  SUBJECTIVE:                                                                                                                                                                                             SUBJECTIVE STATEMENT: Patient reports having a busy week with work.    Pt accompanied by: self  PERTINENT HISTORY:   Patient presents to PT for evaluation of Parkinson's Disease. He has additional L shoulder pain s/p ACDF 11/2022. His Parkinson's has L  sided resting tremor with some bilateral tremors. He has limited R knee ROM at baseline. PMH includes Depression, anxiety, arthritis, bilateral sciatica, insomnia, obesity, OSA, seizures, spinal stenosis, spondylolisthesis at L4-5, PTSD, and lymphadenopathy retroperitoneal. Patient reports primary issue on L side, difficulty picking L foot up, drop foot of L foot and heel, stumbles but no falls. Pivots are challenging. Has Dystonia with L foot, starting in R side in past two weeks. 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  PAIN:  Are you having pain?  Dystonia of L foot   PRECAUTIONS: Fall   WEIGHT BEARING RESTRICTIONS: No  FALLS: Has patient fallen in last 6 months?  Stumbles but no falls to ground.   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 Prior C4-C6 ACDF without evidence of hardware complication. No residual high-grade stenosis. 2. Unchanged multilevel cervical spondylosis as described above. Unchanged moderate to severe right neuroforaminal stenosis at C3-C4. Unchanged mild left  neuroforaminal stenosis at C2-C3 and C3-C4.   COGNITION: Overall cognitive status: Within functional limits for tasks assessed   SENSATION: WFL  COORDINATION: Heel slide: L slower Finger to nose test: L dysmetria and slower speed   POSTURE: rounded shoulders, increased thoracic kyphosis, and weight shift right   LOWER EXTREMITY MMT:    MMT Right Eval Left Eval  Hip flexion 5 4  Hip abduction 4 4-  Hip adduction 5 4  Knee flexion 5 4-  Knee extension 5 4-  Ankle dorsiflexion 5 4  Ankle plantarflexion 5 4  (Blank rows = not tested)    TRANSFERS: Assistive device utilized: None  Sit to stand: SBA Stand to sit: SBA   STAIRS: Level of Assistance: SBA Stair Negotiation Technique: Alternating Pattern  with Single Rail on Right Number of Stairs: 4  Height of Stairs: 6 inch  Comments: decreased clearance of LLE  GAIT: Gait pattern:    decreased L ankle DF, R trunk lean during R stance, limited L arm swing, narrow BOS, B decreased step length Distance walked: 1050' Assistive device utilized: None Level of assistance: CGA Comments: see above; L foot collapse with weightbearing.   FUNCTIONAL TESTS:  5 times sit to stand: 14.7 6 minute walk test: 1025 Functional gait assessment: 17/30  PATIENT SURVEYS:  FOTO 60  TODAY'S TREATMENT:                                                                                                                              DATE: 05/20/23   Neuro Re-ed:  Waylan Boga fitness x 6 min level 3 for aerobic  priming as well as reciprocal movement training   Transitioned to performing all standing PWR! Moves on airex beam working on both frontal plane and sagital plane balance challenges across the various PWR! Moves. X 10 ea, cues for maintaining appropriate foot placement for optimal balance.   PWR! Up works on postural strengthening and antigravity extension, PRW! Rock working on [employer] shifting, PRW! Twist targeting trunk  rotation and PRW! Step targeting transition movements. PWR! Moves target bradykinesia, rigidity, and dyskinesia through targeted functional movements that address four core movement difficulties for people with Parkinson's disease.  TherEx:    RTB ER 15x, 2 sets GTB DF 20x 2 sets  Heel raises on incline of 20 degrees 2 x 12 reps   PATIENT EDUCATION: Education details: goals, POC Person educated: Patient Education method: Explanation, Demonstration, Tactile cues, and Verbal cues Education comprehension: verbalized understanding, returned demonstration, verbal cues required, and tactile cues required  HOME EXERCISE PROGRAM: Access Code: [email or web address] URL: [email or web address] Date: 04/27/2023 Prepared by: Precious Bard  Exercises - Single Leg Stance with Support  - 1 x daily - 7 x weekly - 2 sets - 2 reps - 30 hold - Seated Ankle Alphabet  - 1 x daily - 7 x weekly - 2 sets - 1 reps - 5 hold - Forward T with Counter Support  - 1 x daily - 7 x weekly - 2 sets - 10 reps - 5 hold  GOALS: Goals reviewed with patient? Yes  SHORT TERM GOALS: Target date: 05/13/2023    Patient will be independent in home exercise program to improve strength/mobility for better functional independence with ADLs. Baseline:7/31: give next session Goal status: INITIAL    LONG TERM GOALS: Target date: 07/08/2023   Patient will increase FOTO score to equal to or greater than  70%   to demonstrate statistically significant improvement in mobility and quality of life.  Baseline: 7/31: 60% Goal status: INITIAL  2.  Patient will increase Functional Gait Assessment score to >28/30 as to reduce fall risk and improve dynamic gait safety with community ambulation. Baseline: 7/31: 17/30 Goal status: INITIAL  3.  Patient will increase six minute walk test distance to >1500 for progression to age norm community ambulator and improve gait ability Baseline: 7/31: 1025 ft  Goal status: INITIAL  4.   Patient will return to independent walking and gym program for return to PLOF. Baseline: 7/31: not consisently walking, not going to gym.  Goal status: INITIAL    ASSESSMENT:  CLINICAL IMPRESSION:  Patient arrived with good motivation form completion of pt activities.  Pt continues with instruction in PWR! Moves in standing to address posture, weight shift and balance, and transitions then was progressed to challenge high level balance with all of the standing PWR! Moves.  Pt will continue to benefit from skilled physical therapy intervention to address impairments, improve QOL, and attain therapy goals.    OBJECTIVE IMPAIRMENTS: Abnormal gait, decreased activity tolerance, decreased balance, decreased coordination, decreased endurance, decreased mobility, difficulty walking, decreased strength, impaired flexibility, improper body mechanics, postural dysfunction, and pain.   ACTIVITY LIMITATIONS: carrying, lifting, bending, standing, squatting, stairs, transfers, dressing, locomotion level, and caring for others  PARTICIPATION LIMITATIONS: meal prep, cleaning, laundry, shopping, community activity, occupation, and yard work  PERSONAL FACTORS: Age, Fitness, Past/current experiences, Time since onset of injury/illness/exacerbation, and 3+ comorbidities: Depression, anxiety, arthritis, bilateral sciatica, insomnia, obesity, OSA, seizures, spinal stenosis, spondylolisthesis at L4-5, PTSD, and lymphadenopathy retroperitoneal  are also affecting patient's functional outcome.  REHAB POTENTIAL: Good  CLINICAL DECISION MAKING: Evolving/moderate complexity  EVALUATION COMPLEXITY: Moderate  PLAN:  PT FREQUENCY: 2x/week  PT DURATION: 12 weeks  PLANNED INTERVENTIONS: Therapeutic exercises, Therapeutic activity, Neuromuscular re-education, Balance training, Gait training, Patient/Family education, Self Care, Joint mobilization, Stair training, Vestibular training, Canalith repositioning,  Visual/preceptual remediation/compensation, Orthotic/Fit training, Dry Needling, Electrical stimulation, Spinal mobilization, Cryotherapy, Moist heat, Splintting, Taping, Traction, Manual therapy, and Re-evaluation  PLAN FOR NEXT SESSION: dual task, L foot stabilization, ankle righting reactions    Norman Herrlich, PT 05/20/2023, 8:14 AM

## 2023-05-20 NOTE — Telephone Encounter (Signed)
Script was not sent to the dosage, patient notified.

## 2023-05-21 ENCOUNTER — Other Ambulatory Visit: Payer: Self-pay | Admitting: Psychiatry

## 2023-05-21 MED ORDER — SERTRALINE HCL 100 MG PO TABS: 150.0000 mg | ORAL_TABLET | Freq: Every day | ORAL | 1 refills | Status: DC

## 2023-05-21 NOTE — Telephone Encounter (Signed)
Patient notified of Rx and directions.

## 2023-05-25 ENCOUNTER — Ambulatory Visit: Payer: BC Managed Care – PPO | Admitting: Physical Therapy

## 2023-05-25 DIAGNOSIS — R262 Difficulty in walking, not elsewhere classified: Secondary | ICD-10-CM

## 2023-05-25 DIAGNOSIS — R2681 Unsteadiness on feet: Secondary | ICD-10-CM

## 2023-05-25 NOTE — Therapy (Signed)
OUTPATIENT PHYSICAL THERAPY NEURO TREATMENT   Patient Name: Dakota Davis MRN: [id number] DOB:02/01/1966, 57 y.o., male Today's Date: 05/25/2023   PCP: Lauro Regulus  REFERRING PROVIDER: Lonell Face MD   END OF SESSION:  PT End of Session - 05/25/23 0810     Visit Number 8    Number of Visits 24    Date for PT Re-Evaluation 07/08/23    Progress Note Due on Visit 10    PT Start Time 0808    PT Stop Time 0844    PT Time Calculation (min) 36 min    Equipment Utilized During Treatment Gait belt    Activity Tolerance Patient tolerated treatment well    Behavior During Therapy WFL for tasks assessed/performed                   Past Medical History:  Diagnosis Date   Anxiety    Anxiety    Arthritis    Depression    Hyperlipidemia    Insomnia    Lymphadenopathy, retroperitoneal    OSA (obstructive sleep apnea)    Parkinson disease    Sciatica    Seizures (HCC)    Spinal stenosis    Spinal stenosis    Spondylisthesis    Past Surgical History:  Procedure Laterality Date   BACK SURGERY     BACK SURGERY     10/31/2016,12/21/2015   CARDIAC CATHETERIZATION     ESOPHAGOGASTRODUODENOSCOPY N/A 11/27/2021   Procedure: ESOPHAGOGASTRODUODENOSCOPY (EGD);  Surgeon: Toledo, Boykin Nearing, MD;  Location: [facility] ENDOSCOPY;  Service: Gastroenterology;  Laterality: N/A;   KNEE ARTHROSCOPY     2018   LEFT HEART CATH AND CORONARY ANGIOGRAPHY N/A 08/05/2017   Procedure: LEFT HEART CATH AND CORONARY ANGIOGRAPHY;  Surgeon: Lamar Blinks, MD;  Location: [facility] INVASIVE CV LAB;  Service: Cardiovascular;  Laterality: N/A;   TONSILECTOMY/ADENOIDECTOMY WITH MYRINGOTOMY     Patient Active Problem List   Diagnosis Date Noted   GAD (generalized anxiety disorder) 07/04/2018   PTSD (post-traumatic stress disorder) 07/04/2018   Stable angina 08/03/2017    ONSET DATE: 2022  REFERRING DIAG: Parkinson's Disease  THERAPY DIAG:  Difficulty in walking, not elsewhere  classified  Unsteadiness on feet  Rationale for Evaluation and Treatment: Rehabilitation  SUBJECTIVE:                                                                                                                                                                                             SUBJECTIVE STATEMENT: Pt reports he is sore following moving a lot of mulch for his yard over the weekend.  Pt accompanied by: self  PERTINENT HISTORY:   Patient presents to PT for evaluation of Parkinson's Disease. He has additional L shoulder pain s/p ACDF 11/2022. His Parkinson's has L sided resting tremor with some bilateral tremors. He has limited R knee ROM at baseline. PMH includes Depression, anxiety, arthritis, bilateral sciatica, insomnia, obesity, OSA, seizures, spinal stenosis, spondylolisthesis at L4-5, PTSD, and lymphadenopathy retroperitoneal. Patient reports primary issue on L side, difficulty picking L foot up, drop foot of L foot and heel, stumbles but no falls. Pivots are challenging. Has Dystonia with L foot, starting in R side in past two weeks. 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  PAIN:  Are you having pain?  Dystonia of L foot   PRECAUTIONS: Fall   WEIGHT BEARING RESTRICTIONS: No  FALLS: Has patient fallen in last 6 months?  Stumbles but no falls to ground.   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 Prior C4-C6 ACDF without evidence of hardware complication. No residual high-grade stenosis. 2. Unchanged multilevel cervical spondylosis as  described above. Unchanged moderate to severe right neuroforaminal stenosis at C3-C4. Unchanged mild left neuroforaminal stenosis at C2-C3 and C3-C4.   COGNITION: Overall cognitive status: Within functional limits for tasks assessed   SENSATION: WFL  COORDINATION: Heel slide: L slower Finger to nose test: L dysmetria and slower speed   POSTURE: rounded shoulders, increased thoracic kyphosis, and weight shift right   LOWER EXTREMITY MMT:    MMT Right Eval Left Eval  Hip flexion 5 4  Hip abduction 4 4-  Hip adduction 5 4  Knee flexion 5 4-  Knee extension 5 4-  Ankle dorsiflexion 5 4  Ankle plantarflexion 5 4  (Blank rows = not tested)    TRANSFERS: Assistive device utilized: None  Sit to stand: SBA Stand to sit: SBA   STAIRS: Level of Assistance: SBA Stair Negotiation Technique: Alternating Pattern  with Single Rail on Right Number of Stairs: 4  Height of Stairs: 6 inch  Comments: decreased clearance of LLE  GAIT: Gait pattern:    decreased L ankle DF, R trunk lean during R stance, limited L arm swing, narrow BOS, B decreased step length Distance walked: 1050' Assistive device utilized: None Level of assistance: CGA Comments: see above; L foot collapse with weightbearing.   FUNCTIONAL TESTS:  5 times sit to stand: 14.7 6 minute walk test: 1025 Functional gait assessment: 17/30  PATIENT SURVEYS:  FOTO 60  TODAY'S TREATMENT:  DATE: 05/25/23   Neuro Re-ed:  Waylan Boga fitness x 6 min level 3 for aerobic priming as well as reciprocal movement training   Focussed on PWR! In positions to improve pt truncal mobility and tightness.   Supine PWR! [id number], rock x10 ea and twist x10ea  Prone PWR! Up x 15, and twist x 15 ea  All 4s on mat table for knee relief up, rock and twist x 15 ea   PWR! Up works on postural strengthening and  antigravity extension, PRW! Rock working on [employer] shifting, PRW! Twist targeting trunk rotation and PRW! Step targeting transition movements. PWR! Moves target bradykinesia, rigidity, and dyskinesia through targeted functional movements that address four core movement difficulties for people with Parkinson's disease.  PATIENT EDUCATION: Education details: goals, POC Person educated: Patient Education method: Explanation, Demonstration, Tactile cues, and Verbal cues Education comprehension: verbalized understanding, returned demonstration, verbal cues required, and tactile cues required  HOME EXERCISE PROGRAM: Access Code: [email or web address] URL: [email or web address] Date: 04/27/2023 Prepared by: Precious Bard  Exercises - Single Leg Stance with Support  - 1 x daily - 7 x weekly - 2 sets - 2 reps - 30 hold - Seated Ankle Alphabet  - 1 x daily - 7 x weekly - 2 sets - 1 reps - 5 hold - Forward T with Counter Support  - 1 x daily - 7 x weekly - 2 sets - 10 reps - 5 hold  GOALS: Goals reviewed with patient? Yes  SHORT TERM GOALS: Target date: 05/13/2023    Patient will be independent in home exercise program to improve strength/mobility for better functional independence with ADLs. Baseline:7/31: give next session Goal status: INITIAL    LONG TERM GOALS: Target date: 07/08/2023   Patient will increase FOTO score to equal to or greater than  70%   to demonstrate statistically significant improvement in mobility and quality of life.  Baseline: 7/31: 60% Goal status: INITIAL  2.  Patient will increase Functional Gait Assessment score to >28/30 as to reduce fall risk and improve dynamic gait safety with community ambulation. Baseline: 7/31: 17/30 Goal status: INITIAL  3.  Patient will increase six minute walk test distance to >1500 for progression to age norm community ambulator and improve gait ability Baseline: 7/31: 1025 ft  Goal status: INITIAL  4.  Patient  will return to independent walking and gym program for return to PLOF. Baseline: 7/31: not consisently walking, not going to gym.  Goal status: INITIAL    ASSESSMENT:  CLINICAL IMPRESSION:  Patient arrived with good motivation form completion of pt activities.  Pt introduced to PWR! Moves in supine, prone and all 4s position with focus on exercises for truncal mobility and rotation to help with alleviation of pain in his back this date and in the future.  Pt will continue to benefit from skilled physical therapy intervention to address impairments, improve QOL, and attain therapy goals.    OBJECTIVE IMPAIRMENTS: Abnormal gait, decreased activity tolerance, decreased balance, decreased coordination, decreased endurance, decreased mobility, difficulty walking, decreased strength, impaired flexibility, improper body mechanics, postural dysfunction, and pain.   ACTIVITY LIMITATIONS: carrying, lifting, bending, standing, squatting, stairs, transfers, dressing, locomotion level, and caring for others  PARTICIPATION LIMITATIONS: meal prep, cleaning, laundry, shopping, community activity, occupation, and yard work  PERSONAL FACTORS: Age, Fitness, Past/current experiences, Time since onset of injury/illness/exacerbation, and 3+ comorbidities: Depression, anxiety, arthritis, bilateral sciatica, insomnia, obesity, OSA, seizures, spinal stenosis, spondylolisthesis at L4-5, PTSD, and lymphadenopathy retroperitoneal  are also affecting patient's  functional outcome.   REHAB POTENTIAL: Good  CLINICAL DECISION MAKING: Evolving/moderate complexity  EVALUATION COMPLEXITY: Moderate  PLAN:  PT FREQUENCY: 2x/week  PT DURATION: 12 weeks  PLANNED INTERVENTIONS: Therapeutic exercises, Therapeutic activity, Neuromuscular re-education, Balance training, Gait training, Patient/Family education, Self Care, Joint mobilization, Stair training, Vestibular training, Canalith repositioning, Visual/preceptual  remediation/compensation, Orthotic/Fit training, Dry Needling, Electrical stimulation, Spinal mobilization, Cryotherapy, Moist heat, Splintting, Taping, Traction, Manual therapy, and Re-evaluation  PLAN FOR NEXT SESSION: dual task, L foot stabilization, ankle righting reactions    Norman Herrlich, PT 05/25/2023, 8:11 AM

## 2023-05-27 ENCOUNTER — Ambulatory Visit: Payer: BC Managed Care – PPO | Admitting: Physical Therapy

## 2023-05-27 DIAGNOSIS — R2681 Unsteadiness on feet: Secondary | ICD-10-CM

## 2023-05-27 DIAGNOSIS — R262 Difficulty in walking, not elsewhere classified: Secondary | ICD-10-CM

## 2023-05-27 NOTE — Therapy (Signed)
OUTPATIENT PHYSICAL THERAPY NEURO TREATMENT   Patient Name: Dakota Davis MRN: [id number] DOB:31-Mar-1966, 57 y.o., male Today's Date: 05/27/2023   PCP: Lauro Regulus  REFERRING PROVIDER: Lonell Face MD   END OF SESSION:  PT End of Session - 05/27/23 0805     Visit Number 9    Number of Visits 24    Date for PT Re-Evaluation 07/08/23    Progress Note Due on Visit 10    PT Start Time 0803    PT Stop Time 0843    PT Time Calculation (min) 40 min    Equipment Utilized During Treatment Gait belt    Activity Tolerance Patient tolerated treatment well    Behavior During Therapy WFL for tasks assessed/performed                   Past Medical History:  Diagnosis Date   Anxiety    Anxiety    Arthritis    Depression    Hyperlipidemia    Insomnia    Lymphadenopathy, retroperitoneal    OSA (obstructive sleep apnea)    Parkinson disease    Sciatica    Seizures (HCC)    Spinal stenosis    Spinal stenosis    Spondylisthesis    Past Surgical History:  Procedure Laterality Date   BACK SURGERY     BACK SURGERY     10/31/2016,12/21/2015   CARDIAC CATHETERIZATION     ESOPHAGOGASTRODUODENOSCOPY N/A 11/27/2021   Procedure: ESOPHAGOGASTRODUODENOSCOPY (EGD);  Surgeon: Toledo, Boykin Nearing, MD;  Location: [facility];  Service: Gastroenterology;  Laterality: N/A;   KNEE ARTHROSCOPY     2018   LEFT HEART CATH AND CORONARY ANGIOGRAPHY N/A 08/05/2017   Procedure: LEFT HEART CATH AND CORONARY ANGIOGRAPHY;  Surgeon: Lamar Blinks, MD;  Location: [facility];  Service: Cardiovascular;  Laterality: N/A;   TONSILECTOMY/ADENOIDECTOMY WITH MYRINGOTOMY     Patient Active Problem List   Diagnosis Date Noted   GAD (generalized anxiety disorder) 07/04/2018   PTSD (post-traumatic stress disorder) 07/04/2018   Stable angina 08/03/2017    ONSET DATE: 2022  REFERRING DIAG: Parkinson's Disease  THERAPY DIAG:  Difficulty in walking, not elsewhere  classified  Unsteadiness on feet  Rationale for Evaluation and Treatment: Rehabilitation  SUBJECTIVE:                                                                                                                                                                                             SUBJECTIVE STATEMENT: Pt reports he is doing well. Is going on vacation all next week.    Pt accompanied by:  self  PERTINENT HISTORY:   Patient presents to PT for evaluation of Parkinson's Disease. He has additional L shoulder pain s/p ACDF 11/2022. His Parkinson's has L sided resting tremor with some bilateral tremors. He has limited R knee ROM at baseline. PMH includes Depression, anxiety, arthritis, bilateral sciatica, insomnia, obesity, OSA, seizures, spinal stenosis, spondylolisthesis at L4-5, PTSD, and lymphadenopathy retroperitoneal. Patient reports primary issue on L side, difficulty picking L foot up, drop foot of L foot and heel, stumbles but no falls. Pivots are challenging. Has Dystonia with L foot, starting in R side in past two weeks. 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  PAIN:  Are you having pain?  Dystonia of L foot   PRECAUTIONS: Fall   WEIGHT BEARING RESTRICTIONS: No  FALLS: Has patient fallen in last 6 months?  Stumbles but no falls to ground.   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 Prior C4-C6 ACDF without evidence of hardware complication. No residual high-grade stenosis. 2. Unchanged multilevel cervical spondylosis as described  above. Unchanged moderate to severe right neuroforaminal stenosis at C3-C4. Unchanged mild left neuroforaminal stenosis at C2-C3 and C3-C4.   COGNITION: Overall cognitive status: Within functional limits for tasks assessed   SENSATION: WFL  COORDINATION: Heel slide: L slower Finger to nose test: L dysmetria and slower speed   POSTURE: rounded shoulders, increased thoracic kyphosis, and weight shift right   LOWER EXTREMITY MMT:    MMT Right Eval Left Eval  Hip flexion 5 4  Hip abduction 4 4-  Hip adduction 5 4  Knee flexion 5 4-  Knee extension 5 4-  Ankle dorsiflexion 5 4  Ankle plantarflexion 5 4  (Blank rows = not tested)    TRANSFERS: Assistive device utilized: None  Sit to stand: SBA Stand to sit: SBA   STAIRS: Level of Assistance: SBA Stair Negotiation Technique: Alternating Pattern  with Single Rail on Right Number of Stairs: 4  Height of Stairs: 6 inch  Comments: decreased clearance of LLE  GAIT: Gait pattern:    decreased L ankle DF, R trunk lean during R stance, limited L arm swing, narrow BOS, B decreased step length Distance walked: 1050' Assistive device utilized: None Level of assistance: CGA Comments: see above; L foot collapse with weightbearing.   FUNCTIONAL TESTS:  5 times sit to stand: 14.7 6 minute walk test: 1025 Functional gait assessment: 17/30  PATIENT SURVEYS:  FOTO 60  TODAY'S TREATMENT:  DATE: 05/27/23   Neuro Re-ed:  Waylan Boga fitness x 6 min level 3 for aerobic priming as well as reciprocal movement training   Seated PWR! Moves x 15 of PWR! Up - x 10 ea side twist, and rock - PWR! Step, STS PWR! Up, then repeat to other side x 10 total reps ( 5 ea side)   Transitioned to performing all standing PWR! Moves on airex beam working on both frontal plane and sagital plane balance challenges across the  various PWR! Moves. X 10 ea, cues for maintaining appropriate foot placement for optimal balance.   PWR! Up works on postural strengthening and antigravity extension, PRW! Rock working on [employer] shifting, PRW! Twist targeting trunk rotation and PRW! Step targeting transition movements. PWR! Moves target bradykinesia, rigidity, and dyskinesia through targeted functional movements that address four core movement difficulties for people with Parkinson's disease.  TherEx:  GTB ER 15x, 2 sets GTB DF 20x 2 sets  Heel raises on 1/2 foam roll incline degrees 3 x 12 reps   PATIENT EDUCATION: Education details: goals, POC Person educated: Patient Education method: Explanation, Demonstration, Tactile cues, and Verbal cues Education comprehension: verbalized understanding, returned demonstration, verbal cues required, and tactile cues required  HOME EXERCISE PROGRAM: Access Code: [email or web address] URL: [email or web address] Date: 04/27/2023 Prepared by: Precious Bard  Exercises - Single Leg Stance with Support  - 1 x daily - 7 x weekly - 2 sets - 2 reps - 30 hold - Seated Ankle Alphabet  - 1 x daily - 7 x weekly - 2 sets - 1 reps - 5 hold - Forward T with Counter Support  - 1 x daily - 7 x weekly - 2 sets - 10 reps - 5 hold  GOALS: Goals reviewed with patient? Yes  SHORT TERM GOALS: Target date: 05/13/2023    Patient will be independent in home exercise program to improve strength/mobility for better functional independence with ADLs. Baseline:7/31: give next session Goal status: INITIAL    LONG TERM GOALS: Target date: 07/08/2023   Patient will increase FOTO score to equal to or greater than  70%   to demonstrate statistically significant improvement in mobility and quality of life.  Baseline: 7/31: 60% Goal status: INITIAL  2.  Patient will increase Functional Gait Assessment score to >28/30 as to reduce fall risk and improve dynamic gait safety with community  ambulation. Baseline: 7/31: 17/30 Goal status: INITIAL  3.  Patient will increase six minute walk test distance to >1500 for progression to age norm community ambulator and improve gait ability Baseline: 7/31: 1025 ft  Goal status: INITIAL  4.  Patient will return to independent walking and gym program for return to PLOF. Baseline: 7/31: not consisently walking, not going to gym.  Goal status: INITIAL    ASSESSMENT:  CLINICAL IMPRESSION:  Patient arrived with good motivation form completion of pt activities.  Continued with current plan of care as laid out in evaluation and recent prior sessions. Pt remains motivated to advance progress toward goals in order to maximize independence and safety at home. Pt requires high level assistance and cuing for completion of exercises in order to provide adequate level of stimulation challenge while minimizing pain and discomfort when possible. Pt closely monitored throughout session pt response and to maximize patient safety during interventions. Pt continues to demonstrate progress toward goals AEB progression of interventions this date either in volume or intensity.  Pt will continue to benefit from skilled physical therapy intervention to address impairments, improve  QOL, and attain therapy goals.    OBJECTIVE IMPAIRMENTS: Abnormal gait, decreased activity tolerance, decreased balance, decreased coordination, decreased endurance, decreased mobility, difficulty walking, decreased strength, impaired flexibility, improper body mechanics, postural dysfunction, and pain.   ACTIVITY LIMITATIONS: carrying, lifting, bending, standing, squatting, stairs, transfers, dressing, locomotion level, and caring for others  PARTICIPATION LIMITATIONS: meal prep, cleaning, laundry, shopping, community activity, occupation, and yard work  PERSONAL FACTORS: Age, Fitness, Past/current experiences, Time since onset of injury/illness/exacerbation, and 3+ comorbidities:  Depression, anxiety, arthritis, bilateral sciatica, insomnia, obesity, OSA, seizures, spinal stenosis, spondylolisthesis at L4-5, PTSD, and lymphadenopathy retroperitoneal  are also affecting patient's functional outcome.   REHAB POTENTIAL: Good  CLINICAL DECISION MAKING: Evolving/moderate complexity  EVALUATION COMPLEXITY: Moderate  PLAN:  PT FREQUENCY: 2x/week  PT DURATION: 12 weeks  PLANNED INTERVENTIONS: Therapeutic exercises, Therapeutic activity, Neuromuscular re-education, Balance training, Gait training, Patient/Family education, Self Care, Joint mobilization, Stair training, Vestibular training, Canalith repositioning, Visual/preceptual remediation/compensation, Orthotic/Fit training, Dry Needling, Electrical stimulation, Spinal mobilization, Cryotherapy, Moist heat, Splintting, Taping, Traction, Manual therapy, and Re-evaluation  PLAN FOR NEXT SESSION: dual task, L foot stabilization, ankle righting reactions    Norman Herrlich, PT 05/27/2023, 8:09 AM

## 2023-06-01 ENCOUNTER — Ambulatory Visit: Payer: BC Managed Care – PPO | Admitting: Physical Therapy

## 2023-06-03 ENCOUNTER — Ambulatory Visit: Payer: BC Managed Care – PPO | Admitting: Physical Therapy

## 2023-06-03 ENCOUNTER — Other Ambulatory Visit: Payer: Self-pay | Admitting: Student

## 2023-06-03 DIAGNOSIS — R519 Headache, unspecified: Secondary | ICD-10-CM

## 2023-06-08 ENCOUNTER — Ambulatory Visit: Payer: BC Managed Care – PPO | Admitting: Physical Therapy

## 2023-06-08 DIAGNOSIS — R2681 Unsteadiness on feet: Secondary | ICD-10-CM

## 2023-06-08 DIAGNOSIS — R262 Difficulty in walking, not elsewhere classified: Secondary | ICD-10-CM | POA: Diagnosis not present

## 2023-06-08 NOTE — Therapy (Signed)
OUTPATIENT PHYSICAL THERAPY NEURO TREATMENT/ Physical Therapy Progress Note   /Dates of reporting period  04/15/23   to   06/08/23    Patient Name: Dakota Davis MRN: [id number] DOB:[birth year], 57 y.o., male Today's Date: 06/08/2023   PCP: Lauro Regulus  REFERRING PROVIDER: Lonell Face MD   END OF SESSION:  PT End of Session - 06/08/23 0807     Visit Number 10    Number of Visits 24    Date for PT Re-Evaluation 07/08/23    Progress Note Due on Visit 10    PT Start Time 0804    PT Stop Time 0844    PT Time Calculation (min) 40 min    Equipment Utilized During Treatment Gait belt    Activity Tolerance Patient tolerated treatment well    Behavior During Therapy WFL for tasks assessed/performed                    Past Medical History:  Diagnosis Date   Anxiety    Anxiety    Arthritis    Depression    Hyperlipidemia    Insomnia    Lymphadenopathy, retroperitoneal    OSA (obstructive sleep apnea)    Parkinson disease    Sciatica    Seizures (HCC)    Spinal stenosis    Spinal stenosis    Spondylisthesis    Past Surgical History:  Procedure Laterality Date   BACK SURGERY     BACK SURGERY     10/31/2016,12/21/2015   CARDIAC CATHETERIZATION     ESOPHAGOGASTRODUODENOSCOPY N/A 11/27/2021   Procedure: ESOPHAGOGASTRODUODENOSCOPY (EGD);  Surgeon: Toledo, Boykin Nearing, MD;  Location: [facility];  Service: Gastroenterology;  Laterality: N/A;   KNEE ARTHROSCOPY     2018   LEFT HEART CATH AND CORONARY ANGIOGRAPHY N/A 08/05/2017   Procedure: LEFT HEART CATH AND CORONARY ANGIOGRAPHY;  Surgeon: Lamar Blinks, MD;  Location: [facility];  Service: Cardiovascular;  Laterality: N/A;   TONSILECTOMY/ADENOIDECTOMY WITH MYRINGOTOMY     Patient Active Problem List   Diagnosis Date Noted   GAD (generalized anxiety disorder) 07/04/2018   PTSD (post-traumatic stress disorder) 07/04/2018   Stable angina 08/03/2017    ONSET DATE: 2022  REFERRING  DIAG: Parkinson's Disease  THERAPY DIAG:  Difficulty in walking, not elsewhere classified  Unsteadiness on feet  Rationale for Evaluation and Treatment: Rehabilitation  SUBJECTIVE:                                                                                                                                                                                             SUBJECTIVE  STATEMENT: Pt reports he is doing well. Is going on vacation all next week.    Pt accompanied by: self  PERTINENT HISTORY:   Patient presents to PT for evaluation of Parkinson's Disease. He has additional L shoulder pain s/p ACDF 11/2022. His Parkinson's has L sided resting tremor with some bilateral tremors. He has limited R knee ROM at baseline. PMH includes Depression, anxiety, arthritis, bilateral sciatica, insomnia, obesity, OSA, seizures, spinal stenosis, spondylolisthesis at L4-5, PTSD, and lymphadenopathy retroperitoneal. Patient reports primary issue on L side, difficulty picking L foot up, drop foot of L foot and heel, stumbles but no falls. Pivots are challenging. Has Dystonia with L foot, starting in R side in past two weeks. 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  PAIN:  Are you having pain?  Dystonia of L foot   PRECAUTIONS: Fall   WEIGHT BEARING RESTRICTIONS: No  FALLS: Has patient fallen in last 6 months?  Stumbles but no falls to ground.   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 Prior C4-C6 ACDF without evidence of hardware complication. No residual  high-grade stenosis. 2. Unchanged multilevel cervical spondylosis as described above. Unchanged moderate to severe right neuroforaminal stenosis at C3-C4. Unchanged mild left neuroforaminal stenosis at C2-C3 and C3-C4.   COGNITION: Overall cognitive status: Within functional limits for tasks assessed   SENSATION: WFL  COORDINATION: Heel slide: L slower Finger to nose test: L dysmetria and slower speed   POSTURE: rounded shoulders, increased thoracic kyphosis, and weight shift right   LOWER EXTREMITY MMT:    MMT Right Eval Left Eval  Hip flexion 5 4  Hip abduction 4 4-  Hip adduction 5 4  Knee flexion 5 4-  Knee extension 5 4-  Ankle dorsiflexion 5 4  Ankle plantarflexion 5 4  (Blank rows = not tested)    TRANSFERS: Assistive device utilized: None  Sit to stand: SBA Stand to sit: SBA   STAIRS: Level of Assistance: SBA Stair Negotiation Technique: Alternating Pattern  with Single Rail on Right Number of Stairs: 4  Height of Stairs: 6 inch  Comments: decreased clearance of LLE  GAIT: Gait pattern:    decreased L ankle DF, R trunk lean during R stance, limited L arm swing, narrow BOS, B decreased step length Distance walked: 1050' Assistive device utilized: None Level of assistance: CGA Comments: see above; L foot collapse with weightbearing.   FUNCTIONAL TESTS:  5 times sit to stand: 14.7 6 minute walk test: 1025 Functional gait assessment: 17/30  PATIENT SURVEYS:  FOTO 60  TODAY'S TREATMENT:  DATE: 06/08/23  Physical therapy treatment session today consisted of completing assessment of goals and administration of testing as demonstrated and documented in flow sheet, treatment, and goals section of this note. Addition treatments may be found below.      Neuro Re-ed:    [facility] PT Assessment - 06/08/23 0001       Functional  Gait  Assessment   Gait assessed  Yes    Gait Level Surface Walks 20 ft in less than 7 sec but greater than 5.5 sec, uses assistive device, slower speed, mild gait deviations, or deviates 6-10 in outside of the 12 in walkway width.    Change in Gait Speed Able to smoothly change walking speed without loss of balance or gait deviation. Deviate no more than 6 in outside of the 12 in walkway width.    Gait with Horizontal Head Turns Performs head turns smoothly with slight change in gait velocity (eg, minor disruption to smooth gait path), deviates 6-10 in outside 12 in walkway width, or uses an assistive device.    Gait with Vertical Head Turns Performs task with slight change in gait velocity (eg, minor disruption to smooth gait path), deviates 6 - 10 in outside 12 in walkway width or uses assistive device    Gait and Pivot Turn Pivot turns safely in greater than 3 sec and stops with no loss of balance, or pivot turns safely within 3 sec and stops with mild imbalance, requires small steps to catch balance.    Step Over Obstacle Is able to step over one shoe box (4.5 in total height) but must slow down and adjust steps to clear box safely. May require verbal cueing.    Gait with Narrow Base of Support Is able to ambulate for 10 steps heel to toe with no staggering.    Gait with Eyes Closed Walks 20 ft, uses assistive device, slower speed, mild gait deviations, deviates 6-10 in outside 12 in walkway width. Ambulates 20 ft in less than 9 sec but greater than 7 sec.    Ambulating Backwards Walks 20 ft, uses assistive device, slower speed, mild gait deviations, deviates 6-10 in outside 12 in walkway width.    Steps Alternating feet, must use rail.    Total Score 21            Walking in hallway with 3# AW  -ball toss x 4 laps ( following ball with eyes)  - ball handoff x 2 laps - ball toss walking backwards ( hard)      PATIENT EDUCATION: Education details: goals, POC Person educated:  Patient Education method: Explanation, Demonstration, Tactile cues, and Verbal cues Education comprehension: verbalized understanding, returned demonstration, verbal cues required, and tactile cues required  HOME EXERCISE PROGRAM: Access Code: [email or web address] URL: [email or web address] Date: 04/27/2023 Prepared by: Precious Bard  Exercises - Single Leg Stance with Support  - 1 x daily - 7 x weekly - 2 sets - 2 reps - 30 hold - Seated Ankle Alphabet  - 1 x daily - 7 x weekly - 2 sets - 1 reps - 5 hold - Forward T with Counter Support  - 1 x daily - 7 x weekly - 2 sets - 10 reps - 5 hold  GOALS: Goals reviewed with patient? Yes  SHORT TERM GOALS: Target date: 05/13/2023    Patient will be independent in home exercise program to improve strength/mobility for better functional independence with ADLs. Baseline:7/31: give next session 9/23: completing daily  Goal status:  INITIAL    LONG TERM GOALS: Target date: 07/08/2023   Patient will increase FOTO score to equal to or greater than  70%   to demonstrate statistically significant improvement in mobility and quality of life.  Baseline: 7/31: 60% 9/23: 73% Goal status: MET  2.  Patient will increase Functional Gait Assessment score to >28/30 as to reduce fall risk and improve dynamic gait safety with community ambulation. Baseline: 7/31: 17/30 9/23: 21/30 Goal status: ONGOING  3.  Patient will increase six minute walk test distance to >1500 for progression to age norm community ambulator and improve gait ability Baseline: 7/31: 1025 ft  9/23: 1485 ft  Goal status: ONGOING   4.  Patient will return to independent walking and gym program for return to PLOF. Baseline: 7/31: not consisently walking, not going to gym. 9/23: Not returned yet, is doing HEP regularly.  Goal status: INITIAL    ASSESSMENT:  CLINICAL IMPRESSION:  Patient arrived to PT for progress note this date. Pt shows progress with FGA balance test  showing improved objective balance measures with ambulatory tasks. Pt has started and diligently worked on his HEP but has still yet to begin a formal walking or gym based regimen. Pt progressed with nearly reaching this goal. Will progress with gym based activities in future session for further progression to independent exercise. Patient's condition has the potential to improve in response to therapy. Maximum improvement is yet to be obtained. The anticipated improvement is attainable and reasonable in a generally predictable time.  Pt will continue to benefit from skilled physical therapy intervention to address impairments, improve QOL, and attain therapy goals.     OBJECTIVE IMPAIRMENTS: Abnormal gait, decreased activity tolerance, decreased balance, decreased coordination, decreased endurance, decreased mobility, difficulty walking, decreased strength, impaired flexibility, improper body mechanics, postural dysfunction, and pain.   ACTIVITY LIMITATIONS: carrying, lifting, bending, standing, squatting, stairs, transfers, dressing, locomotion level, and caring for others  PARTICIPATION LIMITATIONS: meal prep, cleaning, laundry, shopping, community activity, occupation, and yard work  PERSONAL FACTORS: Age, Fitness, Past/current experiences, Time since onset of injury/illness/exacerbation, and 3+ comorbidities: Depression, anxiety, arthritis, bilateral sciatica, insomnia, obesity, OSA, seizures, spinal stenosis, spondylolisthesis at L4-5, PTSD, and lymphadenopathy retroperitoneal  are also affecting patient's functional outcome.   REHAB POTENTIAL: Good  CLINICAL DECISION MAKING: Evolving/moderate complexity  EVALUATION COMPLEXITY: Moderate  PLAN:  PT FREQUENCY: 2x/week  PT DURATION: 12 weeks  PLANNED INTERVENTIONS: Therapeutic exercises, Therapeutic activity, Neuromuscular re-education, Balance training, Gait training, Patient/Family education, Self Care, Joint mobilization, Stair  training, Vestibular training, Canalith repositioning, Visual/preceptual remediation/compensation, Orthotic/Fit training, Dry Needling, Electrical stimulation, Spinal mobilization, Cryotherapy, Moist heat, Splintting, Taping, Traction, Manual therapy, and Re-evaluation  PLAN FOR NEXT SESSION: dual task, L foot stabilization, ankle righting reactions    Norman Herrlich, PT 06/08/2023, 9:40 AM

## 2023-06-09 ENCOUNTER — Ambulatory Visit
Admission: RE | Admit: 2023-06-09 | Discharge: 2023-06-09 | Disposition: A | Discharge status: Home / Self Care | Payer: BC Managed Care – PPO | Source: Ambulatory Visit | Attending: Student | Admitting: Student

## 2023-06-09 DIAGNOSIS — R519 Headache, unspecified: Secondary | ICD-10-CM | POA: Insufficient documentation

## 2023-06-09 MED ORDER — GADOBUTROL 1 MMOL/ML IV SOLN
10.0000 mL | Freq: Once | INTRAVENOUS | Status: AC | PRN
  Administered 2023-06-09: 10 mL via INTRAVENOUS

## 2023-06-11 ENCOUNTER — Ambulatory Visit: Payer: BC Managed Care – PPO | Admitting: Physical Therapy

## 2023-06-11 DIAGNOSIS — R262 Difficulty in walking, not elsewhere classified: Secondary | ICD-10-CM

## 2023-06-11 DIAGNOSIS — R2681 Unsteadiness on feet: Secondary | ICD-10-CM

## 2023-06-11 NOTE — Therapy (Signed)
OUTPATIENT PHYSICAL THERAPY NEURO TREATMENT    Patient Name: Dakota Davis MRN: [id number] DOB:[birth year], 57 y.o., male Today's Date: 06/11/2023   PCP: Lauro Regulus  REFERRING PROVIDER: Lonell Face MD   END OF SESSION:  PT End of Session - 06/11/23 0807     Visit Number 11    Number of Visits 24    Date for PT Re-Evaluation 07/08/23    Progress Note Due on Visit 10    PT Start Time 0805    PT Stop Time 0844    PT Time Calculation (min) 39 min    Equipment Utilized During Treatment Gait belt    Activity Tolerance Patient tolerated treatment well    Behavior During Therapy WFL for tasks assessed/performed                    Past Medical History:  Diagnosis Date   Anxiety    Anxiety    Arthritis    Depression    Hyperlipidemia    Insomnia    Lymphadenopathy, retroperitoneal    OSA (obstructive sleep apnea)    Parkinson disease    Sciatica    Seizures (HCC)    Spinal stenosis    Spinal stenosis    Spondylisthesis    Past Surgical History:  Procedure Laterality Date   BACK SURGERY     BACK SURGERY     10/31/2016,12/21/2015   CARDIAC CATHETERIZATION     ESOPHAGOGASTRODUODENOSCOPY N/A 11/27/2021   Procedure: ESOPHAGOGASTRODUODENOSCOPY (EGD);  Surgeon: Toledo, Boykin Nearing, MD;  Location: [facility];  Service: Gastroenterology;  Laterality: N/A;   KNEE ARTHROSCOPY     2018   LEFT HEART CATH AND CORONARY ANGIOGRAPHY N/A 08/05/2017   Procedure: LEFT HEART CATH AND CORONARY ANGIOGRAPHY;  Surgeon: Lamar Blinks, MD;  Location: [facility];  Service: Cardiovascular;  Laterality: N/A;   TONSILECTOMY/ADENOIDECTOMY WITH MYRINGOTOMY     Patient Active Problem List   Diagnosis Date Noted   GAD (generalized anxiety disorder) 07/04/2018   PTSD (post-traumatic stress disorder) 07/04/2018   Stable angina 08/03/2017    ONSET DATE: 2022  REFERRING DIAG: Parkinson's Disease  THERAPY DIAG:  Difficulty in walking, not elsewhere  classified  Unsteadiness on feet  Rationale for Evaluation and Treatment: Rehabilitation  SUBJECTIVE:                                                                                                                                                                                             SUBJECTIVE STATEMENT: Pt reports he is doing well. Feels a little tight in his back today but is not  having any pain.     Pt accompanied by: self  PERTINENT HISTORY:   Patient presents to PT for evaluation of Parkinson's Disease. He has additional L shoulder pain s/p ACDF 11/2022. His Parkinson's has L sided resting tremor with some bilateral tremors. He has limited R knee ROM at baseline. PMH includes Depression, anxiety, arthritis, bilateral sciatica, insomnia, obesity, OSA, seizures, spinal stenosis, spondylolisthesis at L4-5, PTSD, and lymphadenopathy retroperitoneal. Patient reports primary issue on L side, difficulty picking L foot up, drop foot of L foot and heel, stumbles but no falls. Pivots are challenging. Has Dystonia with L foot, starting in R side in past two weeks. 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  PAIN:  Are you having pain?  Dystonia of L foot   PRECAUTIONS: Fall   WEIGHT BEARING RESTRICTIONS: No  FALLS: Has patient fallen in last 6 months?  Stumbles but no falls to ground.   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 Prior C4-C6 ACDF without evidence of hardware complication. No residual high-grade stenosis. 2. Unchanged multilevel cervical  spondylosis as described above. Unchanged moderate to severe right neuroforaminal stenosis at C3-C4. Unchanged mild left neuroforaminal stenosis at C2-C3 and C3-C4.   COGNITION: Overall cognitive status: Within functional limits for tasks assessed   SENSATION: WFL  COORDINATION: Heel slide: L slower Finger to nose test: L dysmetria and slower speed   POSTURE: rounded shoulders, increased thoracic kyphosis, and weight shift right   LOWER EXTREMITY MMT:    MMT Right Eval Left Eval  Hip flexion 5 4  Hip abduction 4 4-  Hip adduction 5 4  Knee flexion 5 4-  Knee extension 5 4-  Ankle dorsiflexion 5 4  Ankle plantarflexion 5 4  (Blank rows = not tested)    TRANSFERS: Assistive device utilized: None  Sit to stand: SBA Stand to sit: SBA   STAIRS: Level of Assistance: SBA Stair Negotiation Technique: Alternating Pattern  with Single Rail on Right Number of Stairs: 4  Height of Stairs: 6 inch  Comments: decreased clearance of LLE  GAIT: Gait pattern:    decreased L ankle DF, R trunk lean during R stance, limited L arm swing, narrow BOS, B decreased step length Distance walked: 1050' Assistive device utilized: None Level of assistance: CGA Comments: see above; L foot collapse with weightbearing.   FUNCTIONAL TESTS:  5 times sit to stand: 14.7 6 minute walk test: 1025 Functional gait assessment: 17/30  PATIENT SURVEYS:  FOTO 60  TODAY'S TREATMENT:  DATE: 06/11/23  NMR Octane fitness for reciprocal UE and LE movement level 3 x 7 min   Seated PWR! Up with resistance  Standing PWR! Flow from step to twist x 10 ea side  Walking in hallway with 3# AW   -ball toss x 2 laps ( following ball with eyes)  - ball handoff x 2 laps - ball toss walking backwards ( hard)   3/4 romberg stance x 30 sec ea LE on airex pad  TE  Well zone Row  machine level 6 3 x 10  Lat pull down machine level 6 3 x 10 reps  Leg press machine 15 reps at level 7, 12 reps at level 8 and 2 x 10 reps at level 11   Heel raise on incline board 3 x 15 reps, UE support for balance.     PATIENT EDUCATION: Education details: goals, POC Person educated: Patient Education method: Explanation, Demonstration, Tactile cues, and Verbal cues Education comprehension: verbalized understanding, returned demonstration, verbal cues required, and tactile cues required  HOME EXERCISE PROGRAM: Access Code: [email or web address] URL: [email or web address] Date: 04/27/2023 Prepared by: Precious Bard  Exercises - Single Leg Stance with Support  - 1 x daily - 7 x weekly - 2 sets - 2 reps - 30 hold - Seated Ankle Alphabet  - 1 x daily - 7 x weekly - 2 sets - 1 reps - 5 hold - Forward T with Counter Support  - 1 x daily - 7 x weekly - 2 sets - 10 reps - 5 hold  GOALS: Goals reviewed with patient? Yes  SHORT TERM GOALS: Target date: 05/13/2023    Patient will be independent in home exercise program to improve strength/mobility for better functional independence with ADLs. Baseline:7/31: give next session 9/23: completing daily  Goal status: INITIAL    LONG TERM GOALS: Target date: 07/08/2023   Patient will increase FOTO score to equal to or greater than  70%   to demonstrate statistically significant improvement in mobility and quality of life.  Baseline: 7/31: 60% 9/23: 73% Goal status: MET  2.  Patient will increase Functional Gait Assessment score to >28/30 as to reduce fall risk and improve dynamic gait safety with community ambulation. Baseline: 7/31: 17/30 9/23: 21/30 Goal status: ONGOING  3.  Patient will increase six minute walk test distance to >1500 for progression to age norm community ambulator and improve gait ability Baseline: 7/31: 1025 ft  9/23: 1485 ft  Goal status: ONGOING   4.  Patient will return to independent walking and gym  program for return to PLOF. Baseline: 7/31: not consisently walking, not going to gym. 9/23: Not returned yet, is doing HEP regularly.  Goal status: INITIAL    ASSESSMENT:  CLINICAL IMPRESSION:  Patient arrived with good motivation form completion of pt activities.  Pt completes several PD specific movement sequences for posture and balance with good ability to complete. Pt introduced to gym based exercises as early prep for eventual discharge from PT. Pt will continue to benefit from skilled physical therapy intervention to address impairments, improve QOL, and attain therapy goals.     OBJECTIVE IMPAIRMENTS: Abnormal gait, decreased activity tolerance, decreased balance, decreased coordination, decreased endurance, decreased mobility, difficulty walking, decreased strength, impaired flexibility, improper body mechanics, postural dysfunction, and pain.   ACTIVITY LIMITATIONS: carrying, lifting, bending, standing, squatting, stairs, transfers, dressing, locomotion level, and caring for others  PARTICIPATION LIMITATIONS: meal prep, cleaning, laundry, shopping, community activity, occupation, and yard work  PERSONAL FACTORS: Age,  Fitness, Past/current experiences, Time since onset of injury/illness/exacerbation, and 3+ comorbidities: Depression, anxiety, arthritis, bilateral sciatica, insomnia, obesity, OSA, seizures, spinal stenosis, spondylolisthesis at L4-5, PTSD, and lymphadenopathy retroperitoneal  are also affecting patient's functional outcome.   REHAB POTENTIAL: Good  CLINICAL DECISION MAKING: Evolving/moderate complexity  EVALUATION COMPLEXITY: Moderate  PLAN:  PT FREQUENCY: 2x/week  PT DURATION: 12 weeks  PLANNED INTERVENTIONS: Therapeutic exercises, Therapeutic activity, Neuromuscular re-education, Balance training, Gait training, Patient/Family education, Self Care, Joint mobilization, Stair training, Vestibular training, Canalith repositioning, Visual/preceptual  remediation/compensation, Orthotic/Fit training, Dry Needling, Electrical stimulation, Spinal mobilization, Cryotherapy, Moist heat, Splintting, Taping, Traction, Manual therapy, and Re-evaluation  PLAN FOR NEXT SESSION: dual task, L foot stabilization, ankle righting reactions    Norman Herrlich, PT 06/11/2023, 8:08 AM

## 2023-06-15 ENCOUNTER — Ambulatory Visit: Payer: BC Managed Care – PPO | Admitting: Physical Therapy

## 2023-06-17 ENCOUNTER — Ambulatory Visit: Payer: BC Managed Care – PPO | Admitting: Physical Therapy

## 2023-06-22 ENCOUNTER — Ambulatory Visit: Payer: BC Managed Care – PPO | Attending: Neurology | Admitting: Physical Therapy

## 2023-06-22 DIAGNOSIS — R262 Difficulty in walking, not elsewhere classified: Secondary | ICD-10-CM | POA: Diagnosis present

## 2023-06-22 DIAGNOSIS — R2681 Unsteadiness on feet: Secondary | ICD-10-CM | POA: Diagnosis present

## 2023-06-22 NOTE — Therapy (Signed)
OUTPATIENT PHYSICAL THERAPY NEURO TREATMENT    Patient Name: Dakota Davis MRN: [id number] DOB:[birth year], 57 y.o., male Today's Date: 06/22/2023   PCP: Lauro Regulus  REFERRING PROVIDER: Lonell Face MD   END OF SESSION:  PT End of Session - 06/22/23 0809     Visit Number 12    Number of Visits 24    Date for PT Re-Evaluation 07/08/23    Progress Note Due on Visit 20    PT Start Time 0805    PT Stop Time 0844    PT Time Calculation (min) 39 min    Equipment Utilized During Treatment Gait belt    Activity Tolerance Patient tolerated treatment well    Behavior During Therapy WFL for tasks assessed/performed                    Past Medical History:  Diagnosis Date   Anxiety    Anxiety    Arthritis    Depression    Hyperlipidemia    Insomnia    Lymphadenopathy, retroperitoneal    OSA (obstructive sleep apnea)    Parkinson disease    Sciatica    Seizures (HCC)    Spinal stenosis    Spinal stenosis    Spondylisthesis    Past Surgical History:  Procedure Laterality Date   BACK SURGERY     BACK SURGERY     10/31/2016,12/21/2015   CARDIAC CATHETERIZATION     ESOPHAGOGASTRODUODENOSCOPY N/A 11/27/2021   Procedure: ESOPHAGOGASTRODUODENOSCOPY (EGD);  Surgeon: Toledo, Boykin Nearing, MD;  Location: [facility];  Service: Gastroenterology;  Laterality: N/A;   KNEE ARTHROSCOPY     2018   LEFT HEART CATH AND CORONARY ANGIOGRAPHY N/A 08/05/2017   Procedure: LEFT HEART CATH AND CORONARY ANGIOGRAPHY;  Surgeon: Lamar Blinks, MD;  Location: [facility];  Service: Cardiovascular;  Laterality: N/A;   TONSILECTOMY/ADENOIDECTOMY WITH MYRINGOTOMY     Patient Active Problem List   Diagnosis Date Noted   GAD (generalized anxiety disorder) 07/04/2018   PTSD (post-traumatic stress disorder) 07/04/2018   Stable angina (HCC) 08/03/2017    ONSET DATE: 2022  REFERRING DIAG: Parkinson's Disease  THERAPY DIAG:  Difficulty in walking, not elsewhere  classified  Unsteadiness on feet  Rationale for Evaluation and Treatment: Rehabilitation  SUBJECTIVE:                                                                                                                                                                                             SUBJECTIVE STATEMENT: Pt reports tightness this date. He is looking into PD class and considering gym membership as  well.    Pt accompanied by: self  PERTINENT HISTORY:   Patient presents to PT for evaluation of Parkinson's Disease. He has additional L shoulder pain s/p ACDF 11/2022. His Parkinson's has L sided resting tremor with some bilateral tremors. He has limited R knee ROM at baseline. PMH includes Depression, anxiety, arthritis, bilateral sciatica, insomnia, obesity, OSA, seizures, spinal stenosis, spondylolisthesis at L4-5, PTSD, and lymphadenopathy retroperitoneal. Patient reports primary issue on L side, difficulty picking L foot up, drop foot of L foot and heel, stumbles but no falls. Pivots are challenging. Has Dystonia with L foot, starting in R side in past two weeks. 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  PAIN:  Are you having pain?  Dystonia of L foot   PRECAUTIONS: Fall   WEIGHT BEARING RESTRICTIONS: No  FALLS: Has patient fallen in last 6 months?  Stumbles but no falls to ground.   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 Prior C4-C6 ACDF without evidence of hardware complication. No residual high-grade stenosis. 2. Unchanged multilevel cervical  spondylosis as described above. Unchanged moderate to severe right neuroforaminal stenosis at C3-C4. Unchanged mild left neuroforaminal stenosis at C2-C3 and C3-C4.   COGNITION: Overall cognitive status: Within functional limits for tasks assessed   SENSATION: WFL  COORDINATION: Heel slide: L slower Finger to nose test: L dysmetria and slower speed   POSTURE: rounded shoulders, increased thoracic kyphosis, and weight shift right   LOWER EXTREMITY MMT:    MMT Right Eval Left Eval  Hip flexion 5 4  Hip abduction 4 4-  Hip adduction 5 4  Knee flexion 5 4-  Knee extension 5 4-  Ankle dorsiflexion 5 4  Ankle plantarflexion 5 4  (Blank rows = not tested)    TRANSFERS: Assistive device utilized: None  Sit to stand: SBA Stand to sit: SBA   STAIRS: Level of Assistance: SBA Stair Negotiation Technique: Alternating Pattern  with Single Rail on Right Number of Stairs: 4  Height of Stairs: 6 inch  Comments: decreased clearance of LLE  GAIT: Gait pattern:    decreased L ankle DF, R trunk lean during R stance, limited L arm swing, narrow BOS, B decreased step length Distance walked: 1050' Assistive device utilized: None Level of assistance: CGA Comments: see above; L foot collapse with weightbearing.   FUNCTIONAL TESTS:  5 times sit to stand: 14.7 6 minute walk test: 1025 Functional gait assessment: 17/30  PATIENT SURVEYS:  FOTO 60  TODAY'S TREATMENT:  DATE: 06/22/23  NMR Octane fitness for reciprocal UE and LE movement level 3 x 7 min   Seated PWR! Up x 15  Standing PWR! Flow from step to twist x 10 ea side  -increased difficulty with right side  Supine PWR! Twist x 10 ea side   Walking in hallway with 4# AW  -ball toss x 2 laps ( following ball with eyes)  - ball toss walking backwards  -holding wall scanning laterally walking  backwards  - sidestepping with ball toss with direction changes intermittently   TE  Well zone Chest press 3 x 10 level 6 plate Knee extensions 3 x 10 level 6 plate Hamstring curls 3 x 10 level 8 plate Leg press machine with heel raise 3 x 15 level 7 plate     PATIENT EDUCATION: Education details: goals, POC Person educated: Patient Education method: Explanation, Demonstration, Tactile cues, and Verbal cues Education comprehension: verbalized understanding, returned demonstration, verbal cues required, and tactile cues required  HOME EXERCISE PROGRAM: Access Code: [email or web address] URL: [email or web address] Date: 04/27/2023 Prepared by: Precious Bard  Exercises - Single Leg Stance with Support  - 1 x daily - 7 x weekly - 2 sets - 2 reps - 30 hold - Seated Ankle Alphabet  - 1 x daily - 7 x weekly - 2 sets - 1 reps - 5 hold - Forward T with Counter Support  - 1 x daily - 7 x weekly - 2 sets - 10 reps - 5 hold  GOALS: Goals reviewed with patient? Yes  SHORT TERM GOALS: Target date: 05/13/2023    Patient will be independent in home exercise program to improve strength/mobility for better functional independence with ADLs. Baseline:7/31: give next session 9/23: completing daily  Goal status: INITIAL    LONG TERM GOALS: Target date: 07/08/2023   Patient will increase FOTO score to equal to or greater than  70%   to demonstrate statistically significant improvement in mobility and quality of life.  Baseline: 7/31: 60% 9/23: 73% Goal status: MET  2.  Patient will increase Functional Gait Assessment score to >28/30 as to reduce fall risk and improve dynamic gait safety with community ambulation. Baseline: 7/31: 17/30 9/23: 21/30 Goal status: ONGOING  3.  Patient will increase six minute walk test distance to >1500 for progression to age norm community ambulator and improve gait ability Baseline: 7/31: 1025 ft  9/23: 1485 ft  Goal status: ONGOING   4.  Patient  will return to independent walking and gym program for return to PLOF. Baseline: 7/31: not consisently walking, not going to gym. 9/23: Not returned yet, is doing HEP regularly.  Goal status: INITIAL    ASSESSMENT:  CLINICAL IMPRESSION:  Patient arrived with good motivation form completion of pt activities.  Pt completes several PD specific movement sequences for posture and balance with good ability to complete. Pt continues with gym based exercises as early prep for eventual discharge from PT. Pt will continue to benefit from skilled physical therapy intervention to address impairments, improve QOL, and attain therapy goals.     OBJECTIVE IMPAIRMENTS: Abnormal gait, decreased activity tolerance, decreased balance, decreased coordination, decreased endurance, decreased mobility, difficulty walking, decreased strength, impaired flexibility, improper body mechanics, postural dysfunction, and pain.   ACTIVITY LIMITATIONS: carrying, lifting, bending, standing, squatting, stairs, transfers, dressing, locomotion level, and caring for others  PARTICIPATION LIMITATIONS: meal prep, cleaning, laundry, shopping, community activity, occupation, and yard work  PERSONAL FACTORS: Age, Fitness, Past/current experiences, Time since onset of injury/illness/exacerbation,  and 3+ comorbidities: Depression, anxiety, arthritis, bilateral sciatica, insomnia, obesity, OSA, seizures, spinal stenosis, spondylolisthesis at L4-5, PTSD, and lymphadenopathy retroperitoneal  are also affecting patient's functional outcome.   REHAB POTENTIAL: Good  CLINICAL DECISION MAKING: Evolving/moderate complexity  EVALUATION COMPLEXITY: Moderate  PLAN:  PT FREQUENCY: 2x/week  PT DURATION: 12 weeks  PLANNED INTERVENTIONS: Therapeutic exercises, Therapeutic activity, Neuromuscular re-education, Balance training, Gait training, Patient/Family education, Self Care, Joint mobilization, Stair training, Vestibular training,  Canalith repositioning, Visual/preceptual remediation/compensation, Orthotic/Fit training, Dry Needling, Electrical stimulation, Spinal mobilization, Cryotherapy, Moist heat, Splintting, Taping, Traction, Manual therapy, and Re-evaluation  PLAN FOR NEXT SESSION: dual task, L foot stabilization, ankle righting reactions    Norman Herrlich, PT 06/22/2023, 8:11 AM

## 2023-06-24 ENCOUNTER — Ambulatory Visit: Payer: BC Managed Care – PPO | Admitting: Physical Therapy

## 2023-06-24 DIAGNOSIS — R262 Difficulty in walking, not elsewhere classified: Secondary | ICD-10-CM

## 2023-06-24 DIAGNOSIS — R2681 Unsteadiness on feet: Secondary | ICD-10-CM

## 2023-06-24 NOTE — Therapy (Signed)
OUTPATIENT PHYSICAL THERAPY NEURO TREATMENT    Patient Name: Matson Welch MRN: [id number] DOB:September 09, 1966, 57 y.o., male Today's Date: 06/24/2023   PCP: Lauro Regulus  REFERRING PROVIDER: Lonell Face MD   END OF SESSION:  PT End of Session - 06/24/23 0823     Visit Number 13    Number of Visits 24    Date for PT Re-Evaluation 07/08/23    Progress Note Due on Visit 20    PT Start Time 0817    PT Stop Time 0844    PT Time Calculation (min) 27 min    Equipment Utilized During Treatment Gait belt    Activity Tolerance Patient tolerated treatment well    Behavior During Therapy WFL for tasks assessed/performed                    Past Medical History:  Diagnosis Date   Anxiety    Anxiety    Arthritis    Depression    Hyperlipidemia    Insomnia    Lymphadenopathy, retroperitoneal    OSA (obstructive sleep apnea)    Parkinson disease    Sciatica    Seizures (HCC)    Spinal stenosis    Spinal stenosis    Spondylisthesis    Past Surgical History:  Procedure Laterality Date   BACK SURGERY     BACK SURGERY     10/31/2016,12/21/2015   CARDIAC CATHETERIZATION     ESOPHAGOGASTRODUODENOSCOPY N/A 11/27/2021   Procedure: ESOPHAGOGASTRODUODENOSCOPY (EGD);  Surgeon: Toledo, Boykin Nearing, MD;  Location: [facility];  Service: Gastroenterology;  Laterality: N/A;   KNEE ARTHROSCOPY     2018   LEFT HEART CATH AND CORONARY ANGIOGRAPHY N/A 08/05/2017   Procedure: LEFT HEART CATH AND CORONARY ANGIOGRAPHY;  Surgeon: Lamar Blinks, MD;  Location: [facility];  Service: Cardiovascular;  Laterality: N/A;   TONSILECTOMY/ADENOIDECTOMY WITH MYRINGOTOMY     Patient Active Problem List   Diagnosis Date Noted   GAD (generalized anxiety disorder) 07/04/2018   PTSD (post-traumatic stress disorder) 07/04/2018   Stable angina (HCC) 08/03/2017    ONSET DATE: 2022  REFERRING DIAG: Parkinson's Disease  THERAPY DIAG:  Difficulty in walking, not elsewhere  classified  Unsteadiness on feet  Rationale for Evaluation and Treatment: Rehabilitation  SUBJECTIVE:                                                                                                                                                                                             SUBJECTIVE STATEMENT: Pt reports tightness this date. He is looking into PD class and considering gym membership as  well.    Pt accompanied by: self  PERTINENT HISTORY:   Patient presents to PT for evaluation of Parkinson's Disease. He has additional L shoulder pain s/p ACDF 11/2022. His Parkinson's has L sided resting tremor with some bilateral tremors. He has limited R knee ROM at baseline. PMH includes Depression, anxiety, arthritis, bilateral sciatica, insomnia, obesity, OSA, seizures, spinal stenosis, spondylolisthesis at L4-5, PTSD, and lymphadenopathy retroperitoneal. Patient reports primary issue on L side, difficulty picking L foot up, drop foot of L foot and heel, stumbles but no falls. Pivots are challenging. Has Dystonia with L foot, starting in R side in past two weeks. 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  PAIN:  Are you having pain?  Dystonia of L foot   PRECAUTIONS: Fall   WEIGHT BEARING RESTRICTIONS: No  FALLS: Has patient fallen in last 6 months?  Stumbles but no falls to ground.   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 Prior C4-C6 ACDF without evidence of hardware complication. No residual high-grade stenosis. 2. Unchanged multilevel cervical  spondylosis as described above. Unchanged moderate to severe right neuroforaminal stenosis at C3-C4. Unchanged mild left neuroforaminal stenosis at C2-C3 and C3-C4.   COGNITION: Overall cognitive status: Within functional limits for tasks assessed   SENSATION: WFL  COORDINATION: Heel slide: L slower Finger to nose test: L dysmetria and slower speed   POSTURE: rounded shoulders, increased thoracic kyphosis, and weight shift right   LOWER EXTREMITY MMT:    MMT Right Eval Left Eval  Hip flexion 5 4  Hip abduction 4 4-  Hip adduction 5 4  Knee flexion 5 4-  Knee extension 5 4-  Ankle dorsiflexion 5 4  Ankle plantarflexion 5 4  (Blank rows = not tested)    TRANSFERS: Assistive device utilized: None  Sit to stand: SBA Stand to sit: SBA   STAIRS: Level of Assistance: SBA Stair Negotiation Technique: Alternating Pattern  with Single Rail on Right Number of Stairs: 4  Height of Stairs: 6 inch  Comments: decreased clearance of LLE  GAIT: Gait pattern:    decreased L ankle DF, R trunk lean during R stance, limited L arm swing, narrow BOS, B decreased step length Distance walked: 1050' Assistive device utilized: None Level of assistance: CGA Comments: see above; L foot collapse with weightbearing.   FUNCTIONAL TESTS:  5 times sit to stand: 14.7 6 minute walk test: 1025 Functional gait assessment: 17/30  PATIENT SURVEYS:  FOTO 60  TODAY'S TREATMENT:  DATE: 06/24/23 Pt session was cut a little short today due to pt arriving late to scheduled appointment time  NMR Octane fitness for reciprocal UE and LE movement level 3 x 6 min   All 4s PWR! rock on mat table x 15  All 4s PWR! Twist on mat table x 10 ea side  LTR for lower thoracic mobility 10 x ea side with 5 sec holds   Seated PWR! Up x 15  Standing PWR! Flow from step to twist x 10  ea side  -increased difficulty with right side  Supine PWR! Twist x 10 ea side   Standing on airex beam  -PWR! Up with GTB resistance for hABD -PWR! Step x 10 ea  -PWR! Twist x 10 ea       PATIENT EDUCATION: Education details: goals, POC Person educated: Patient Education method: Explanation, Demonstration, Tactile cues, and Verbal cues Education comprehension: verbalized understanding, returned demonstration, verbal cues required, and tactile cues required  HOME EXERCISE PROGRAM: Access Code: [email or web address] URL: [email or web address] Date: 04/27/2023 Prepared by: Precious Bard  Exercises - Single Leg Stance with Support  - 1 x daily - 7 x weekly - 2 sets - 2 reps - 30 hold - Seated Ankle Alphabet  - 1 x daily - 7 x weekly - 2 sets - 1 reps - 5 hold - Forward T with Counter Support  - 1 x daily - 7 x weekly - 2 sets - 10 reps - 5 hold  GOALS: Goals reviewed with patient? Yes  SHORT TERM GOALS: Target date: 05/13/2023    Patient will be independent in home exercise program to improve strength/mobility for better functional independence with ADLs. Baseline:7/31: give next session 9/23: completing daily  Goal status: INITIAL    LONG TERM GOALS: Target date: 07/08/2023   Patient will increase FOTO score to equal to or greater than  70%   to demonstrate statistically significant improvement in mobility and quality of life.  Baseline: 7/31: 60% 9/23: 73% Goal status: MET  2.  Patient will increase Functional Gait Assessment score to >28/30 as to reduce fall risk and improve dynamic gait safety with community ambulation. Baseline: 7/31: 17/30 9/23: 21/30 Goal status: ONGOING  3.  Patient will increase six minute walk test distance to >1500 for progression to age norm community ambulator and improve gait ability Baseline: 7/31: 1025 ft  9/23: 1485 ft  Goal status: ONGOING   4.  Patient will return to independent walking and gym program for return to  PLOF. Baseline: 7/31: not consisently walking, not going to gym. 9/23: Not returned yet, is doing HEP regularly.  Goal status: INITIAL    ASSESSMENT:  CLINICAL IMPRESSION:  Patient arrived with good motivation form completion of pt activities.  Pt session was cut a little short today due to pt arriving late to scheduled appointment time. Pt completes several PD specific movement sequences for posture and balance with good ability to complete. Pt provided with a few new activities to assist with thoracic tightness and mobility limitations.  Pt will continue to benefit from skilled physical therapy intervention to address impairments, improve QOL, and attain therapy goals.     OBJECTIVE IMPAIRMENTS: Abnormal gait, decreased activity tolerance, decreased balance, decreased coordination, decreased endurance, decreased mobility, difficulty walking, decreased strength, impaired flexibility, improper body mechanics, postural dysfunction, and pain.   ACTIVITY LIMITATIONS: carrying, lifting, bending, standing, squatting, stairs, transfers, dressing, locomotion level, and caring for others  PARTICIPATION LIMITATIONS: meal prep, cleaning, laundry, shopping, community activity,  occupation, and yard work  PERSONAL FACTORS: Age, Fitness, Past/current experiences, Time since onset of injury/illness/exacerbation, and 3+ comorbidities: Depression, anxiety, arthritis, bilateral sciatica, insomnia, obesity, OSA, seizures, spinal stenosis, spondylolisthesis at L4-5, PTSD, and lymphadenopathy retroperitoneal  are also affecting patient's functional outcome.   REHAB POTENTIAL: Good  CLINICAL DECISION MAKING: Evolving/moderate complexity  EVALUATION COMPLEXITY: Moderate  PLAN:  PT FREQUENCY: 2x/week  PT DURATION: 12 weeks  PLANNED INTERVENTIONS: Therapeutic exercises, Therapeutic activity, Neuromuscular re-education, Balance training, Gait training, Patient/Family education, Self Care, Joint mobilization,  Stair training, Vestibular training, Canalith repositioning, Visual/preceptual remediation/compensation, Orthotic/Fit training, Dry Needling, Electrical stimulation, Spinal mobilization, Cryotherapy, Moist heat, Splintting, Taping, Traction, Manual therapy, and Re-evaluation  PLAN FOR NEXT SESSION: dual task, L foot stabilization, ankle righting reactions    Norman Herrlich, PT 06/24/2023, 8:25 AM

## 2023-06-29 ENCOUNTER — Other Ambulatory Visit: Payer: Self-pay | Admitting: Psychiatry

## 2023-06-29 ENCOUNTER — Ambulatory Visit: Payer: BC Managed Care – PPO

## 2023-06-29 ENCOUNTER — Encounter: Payer: Self-pay | Admitting: Physical Therapy

## 2023-06-29 DIAGNOSIS — R262 Difficulty in walking, not elsewhere classified: Secondary | ICD-10-CM

## 2023-06-29 DIAGNOSIS — R2681 Unsteadiness on feet: Secondary | ICD-10-CM

## 2023-06-29 NOTE — Therapy (Signed)
OUTPATIENT PHYSICAL THERAPY NEURO TREATMENT    Patient Name: Dakota Davis MRN: [id number] DOB:02-14-1966, 57 y.o., male Today's Date: 06/29/2023   PCP: Lauro Regulus  REFERRING PROVIDER: Lonell Face MD   END OF SESSION:  PT End of Session - 06/29/23 0806     Visit Number 14    Number of Visits 24    Date for PT Re-Evaluation 07/08/23    Progress Note Due on Visit 20    PT Start Time 0806    PT Stop Time 0844    PT Time Calculation (min) 38 min    Equipment Utilized During Treatment Gait belt    Activity Tolerance Patient tolerated treatment well    Behavior During Therapy WFL for tasks assessed/performed             Past Medical History:  Diagnosis Date   Anxiety    Anxiety    Arthritis    Depression    Hyperlipidemia    Insomnia    Lymphadenopathy, retroperitoneal    OSA (obstructive sleep apnea)    Parkinson disease (HCC)    Sciatica    Seizures (HCC)    Spinal stenosis    Spinal stenosis    Spondylisthesis    Past Surgical History:  Procedure Laterality Date   BACK SURGERY     BACK SURGERY     10/31/2016,12/21/2015   CARDIAC CATHETERIZATION     ESOPHAGOGASTRODUODENOSCOPY N/A 11/27/2021   Procedure: ESOPHAGOGASTRODUODENOSCOPY (EGD);  Surgeon: Toledo, Boykin Nearing, MD;  Location: [facility];  Service: Gastroenterology;  Laterality: N/A;   KNEE ARTHROSCOPY     2018   LEFT HEART CATH AND CORONARY ANGIOGRAPHY N/A 08/05/2017   Procedure: LEFT HEART CATH AND CORONARY ANGIOGRAPHY;  Surgeon: Lamar Blinks, MD;  Location: [facility];  Service: Cardiovascular;  Laterality: N/A;   TONSILECTOMY/ADENOIDECTOMY WITH MYRINGOTOMY     Patient Active Problem List   Diagnosis Date Noted   GAD (generalized anxiety disorder) 07/04/2018   PTSD (post-traumatic stress disorder) 07/04/2018   Stable angina (HCC) 08/03/2017    ONSET DATE: 2022  REFERRING DIAG: Parkinson's Disease  THERAPY DIAG:  Difficulty in walking, not elsewhere  classified  Unsteadiness on feet  Rationale for Evaluation and Treatment: Rehabilitation  SUBJECTIVE:                                                                                                                                                                                             SUBJECTIVE STATEMENT:   He reports he has the paperwork filled out for the PD class, just waiting on MD signature. Patient reports tightness  this date.    Pt accompanied by: self  PERTINENT HISTORY:   Patient presents to PT for evaluation of Parkinson's Disease. He has additional L shoulder pain s/p ACDF 11/2022. His Parkinson's has L sided resting tremor with some bilateral tremors. He has limited R knee ROM at baseline. PMH includes Depression, anxiety, arthritis, bilateral sciatica, insomnia, obesity, OSA, seizures, spinal stenosis, spondylolisthesis at L4-5, PTSD, and lymphadenopathy retroperitoneal. Patient reports primary issue on L side, difficulty picking L foot up, drop foot of L foot and heel, stumbles but no falls. Pivots are challenging. Has Dystonia with L foot, starting in R side in past two weeks. 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  PAIN:  Are you having pain?  Dystonia of L foot   PRECAUTIONS: Fall   WEIGHT BEARING RESTRICTIONS: No  FALLS: Has patient fallen in last 6 months?  Stumbles but no falls to ground.   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 Prior C4-C6 ACDF without evidence of hardware complication. No residual high-grade stenosis. 2.  Unchanged multilevel cervical spondylosis as described above. Unchanged moderate to severe right neuroforaminal stenosis at C3-C4. Unchanged mild left neuroforaminal stenosis at C2-C3 and C3-C4.   COGNITION: Overall cognitive status: Within functional limits for tasks assessed   SENSATION: WFL  COORDINATION: Heel slide: L slower Finger to nose test: L dysmetria and slower speed   POSTURE: rounded shoulders, increased thoracic kyphosis, and weight shift right   LOWER EXTREMITY MMT:    MMT Right Eval Left Eval  Hip flexion 5 4  Hip abduction 4 4-  Hip adduction 5 4  Knee flexion 5 4-  Knee extension 5 4-  Ankle dorsiflexion 5 4  Ankle plantarflexion 5 4  (Blank rows = not tested)    TRANSFERS: Assistive device utilized: None  Sit to stand: SBA Stand to sit: SBA   STAIRS: Level of Assistance: SBA Stair Negotiation Technique: Alternating Pattern  with Single Rail on Right Number of Stairs: 4  Height of Stairs: 6 inch  Comments: decreased clearance of LLE  GAIT: Gait pattern:    decreased L ankle DF, R trunk lean during R stance, limited L arm swing, narrow BOS, B decreased step length Distance walked: 1050' Assistive device utilized: None Level of assistance: CGA Comments: see above; L foot collapse with weightbearing.   FUNCTIONAL TESTS:  5 times sit to stand: 14.7 6 minute walk test: 1025 Functional gait assessment: 17/30  PATIENT SURVEYS:  FOTO 60  TODAY'S TREATMENT:  DATE: 06/29/23   Pt session was cut a little short today due to pt arriving late to scheduled appointment time  NMR Octane fitness for reciprocal UE and LE movement level 3 x 6 min   Large amplitude shoulder horizontal abduction with forward step while standing on airex balance beam 2 x 15 leading with each LE  Large amplitude shoulder horizontal abduction with  lateral step while standing on airex balance beam 2 x 15 leading with each LE  Sit to stand with feet on airex with overhead reach 3 x 10   TE   Well zone Chest press 3 x 10 level 6 plate Knee extensions 3 x 10 level 6 plate Hamstring curls 3 x 10 level 8 plate   PATIENT EDUCATION: Education details: goals, POC Person educated: Patient Education method: Explanation, Demonstration, Tactile cues, and Verbal cues Education comprehension: verbalized understanding, returned demonstration, verbal cues required, and tactile cues required  HOME EXERCISE PROGRAM: Access Code: [email or web address] URL: [email or web address] Date: 04/27/2023 Prepared by: Precious Bard  Exercises - Single Leg Stance with Support  - 1 x daily - 7 x weekly - 2 sets - 2 reps - 30 hold - Seated Ankle Alphabet  - 1 x daily - 7 x weekly - 2 sets - 1 reps - 5 hold - Forward T with Counter Support  - 1 x daily - 7 x weekly - 2 sets - 10 reps - 5 hold  GOALS: Goals reviewed with patient? Yes  SHORT TERM GOALS: Target date: 05/13/2023    Patient will be independent in home exercise program to improve strength/mobility for better functional independence with ADLs. Baseline:7/31: give next session 9/23: completing daily  Goal status: INITIAL    LONG TERM GOALS: Target date: 07/08/2023   Patient will increase FOTO score to equal to or greater than  70%   to demonstrate statistically significant improvement in mobility and quality of life.  Baseline: 7/31: 60% 9/23: 73% Goal status: MET  2.  Patient will increase Functional Gait Assessment score to >28/30 as to reduce fall risk and improve dynamic gait safety with community ambulation. Baseline: 7/31: 17/30 9/23: 21/30 Goal status: ONGOING  3.  Patient will increase six minute walk test distance to >1500 for progression to age norm community ambulator and improve gait ability Baseline: 7/31: 1025 ft  9/23: 1485 ft  Goal status: ONGOING   4.  Patient  will return to independent walking and gym program for return to PLOF. Baseline: 7/31: not consisently walking, not going to gym. 9/23: Not returned yet, is doing HEP regularly.  Goal status: INITIAL    ASSESSMENT:  CLINICAL IMPRESSION:   Patient arrives motivated to participate with complaints of tightness. Session focused on large amplitude movements on unstable surfaces and general strengthening. Patient tolerated session well with no complaints. Pt will continue to benefit from skilled physical therapy intervention to address impairments, improve QOL, and attain therapy goals.     OBJECTIVE IMPAIRMENTS: Abnormal gait, decreased activity tolerance, decreased balance, decreased coordination, decreased endurance, decreased mobility, difficulty walking, decreased strength, impaired flexibility, improper body mechanics, postural dysfunction, and pain.   ACTIVITY LIMITATIONS: carrying, lifting, bending, standing, squatting, stairs, transfers, dressing, locomotion level, and caring for others  PARTICIPATION LIMITATIONS: meal prep, cleaning, laundry, shopping, community activity, occupation, and yard work  PERSONAL FACTORS: Age, Fitness, Past/current experiences, Time since onset of injury/illness/exacerbation, and 3+ comorbidities: Depression, anxiety, arthritis, bilateral sciatica, insomnia, obesity, OSA, seizures, spinal stenosis, spondylolisthesis at L4-5, PTSD, and lymphadenopathy retroperitoneal  are also affecting patient's functional outcome.   REHAB POTENTIAL: Good  CLINICAL DECISION MAKING: Evolving/moderate complexity  EVALUATION COMPLEXITY: Moderate  PLAN:  PT FREQUENCY: 2x/week  PT DURATION: 12 weeks  PLANNED INTERVENTIONS: Therapeutic exercises, Therapeutic activity, Neuromuscular re-education, Balance training, Gait training, Patient/Family education, Self Care, Joint mobilization, Stair training, Vestibular training, Canalith repositioning, Visual/preceptual  remediation/compensation, Orthotic/Fit training, Dry Needling, Electrical stimulation, Spinal mobilization, Cryotherapy, Moist heat, Splintting, Taping, Traction, Manual therapy, and Re-evaluation  PLAN FOR NEXT SESSION: dual task, L foot stabilization, ankle righting reactions    Viviann Spare, PT, DPT  06/29/2023, 8:07 AM

## 2023-06-30 NOTE — Telephone Encounter (Signed)
Rf appropriate.  Lf 05/21/23; nv 07/08/23; lv 05/05/23

## 2023-06-30 NOTE — Therapy (Unsigned)
OUTPATIENT PHYSICAL THERAPY NEURO TREATMENT    Patient Name: Dakota Davis MRN: [id number] DOB:May 27, 1966, 57 y.o., male Today's Date: 07/01/2023   PCP: Lauro Regulus  REFERRING PROVIDER: Lonell Face MD   END OF SESSION:  PT End of Session - 07/01/23 0808     Visit Number 15    Number of Visits 24    Date for PT Re-Evaluation 07/08/23    Progress Note Due on Visit 20    PT Start Time 0806    PT Stop Time 0844    PT Time Calculation (min) 38 min    Equipment Utilized During Treatment Gait belt    Activity Tolerance Patient tolerated treatment well    Behavior During Therapy WFL for tasks assessed/performed              Past Medical History:  Diagnosis Date   Anxiety    Anxiety    Arthritis    Depression    Hyperlipidemia    Insomnia    Lymphadenopathy, retroperitoneal    OSA (obstructive sleep apnea)    Parkinson disease (HCC)    Sciatica    Seizures (HCC)    Spinal stenosis    Spinal stenosis    Spondylisthesis    Past Surgical History:  Procedure Laterality Date   BACK SURGERY     BACK SURGERY     10/31/2016,12/21/2015   CARDIAC CATHETERIZATION     ESOPHAGOGASTRODUODENOSCOPY N/A 11/27/2021   Procedure: ESOPHAGOGASTRODUODENOSCOPY (EGD);  Surgeon: Toledo, Boykin Nearing, MD;  Location: [facility];  Service: Gastroenterology;  Laterality: N/A;   KNEE ARTHROSCOPY     2018   LEFT HEART CATH AND CORONARY ANGIOGRAPHY N/A 08/05/2017   Procedure: LEFT HEART CATH AND CORONARY ANGIOGRAPHY;  Surgeon: Lamar Blinks, MD;  Location: [facility];  Service: Cardiovascular;  Laterality: N/A;   TONSILECTOMY/ADENOIDECTOMY WITH MYRINGOTOMY     Patient Active Problem List   Diagnosis Date Noted   GAD (generalized anxiety disorder) 07/04/2018   PTSD (post-traumatic stress disorder) 07/04/2018   Stable angina (HCC) 08/03/2017    ONSET DATE: 2022  REFERRING DIAG: Parkinson's Disease  THERAPY DIAG:  Difficulty in walking, not elsewhere  classified  Unsteadiness on feet  Rationale for Evaluation and Treatment: Rehabilitation  SUBJECTIVE:                                                                                                                                                                                             SUBJECTIVE STATEMENT:   Pt reports feeling good today, no reports of new tightness at this time.    Pt accompanied by:  self  PERTINENT HISTORY:   Patient presents to PT for evaluation of Parkinson's Disease. He has additional L shoulder pain s/p ACDF 11/2022. His Parkinson's has L sided resting tremor with some bilateral tremors. He has limited R knee ROM at baseline. PMH includes Depression, anxiety, arthritis, bilateral sciatica, insomnia, obesity, OSA, seizures, spinal stenosis, spondylolisthesis at L4-5, PTSD, and lymphadenopathy retroperitoneal. Patient reports primary issue on L side, difficulty picking L foot up, drop foot of L foot and heel, stumbles but no falls. Pivots are challenging. Has Dystonia with L foot, starting in R side in past two weeks. 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  PAIN:  Are you having pain?  Dystonia of L foot   PRECAUTIONS: Fall   WEIGHT BEARING RESTRICTIONS: No  FALLS: Has patient fallen in last 6 months?  Stumbles but no falls to ground.   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 Prior C4-C6 ACDF without evidence of hardware complication. No residual high-grade stenosis. 2. Unchanged multilevel cervical spondylosis as described  above. Unchanged moderate to severe right neuroforaminal stenosis at C3-C4. Unchanged mild left neuroforaminal stenosis at C2-C3 and C3-C4.   COGNITION: Overall cognitive status: Within functional limits for tasks assessed   SENSATION: WFL  COORDINATION: Heel slide: L slower Finger to nose test: L dysmetria and slower speed   POSTURE: rounded shoulders, increased thoracic kyphosis, and weight shift right   LOWER EXTREMITY MMT:    MMT Right Eval Left Eval  Hip flexion 5 4  Hip abduction 4 4-  Hip adduction 5 4  Knee flexion 5 4-  Knee extension 5 4-  Ankle dorsiflexion 5 4  Ankle plantarflexion 5 4  (Blank rows = not tested)    TRANSFERS: Assistive device utilized: None  Sit to stand: SBA Stand to sit: SBA   STAIRS: Level of Assistance: SBA Stair Negotiation Technique: Alternating Pattern  with Single Rail on Right Number of Stairs: 4  Height of Stairs: 6 inch  Comments: decreased clearance of LLE  GAIT: Gait pattern:    decreased L ankle DF, R trunk lean during R stance, limited L arm swing, narrow BOS, B decreased step length Distance walked: 1050' Assistive device utilized: None Level of assistance: CGA Comments: see above; L foot collapse with weightbearing.   FUNCTIONAL TESTS:  5 times sit to stand: 14.7 6 minute walk test: 1025 Functional gait assessment: 17/30  PATIENT SURVEYS:  FOTO 60  TODAY'S TREATMENT:  DATE: 07/01/23    NMR [employer] for reciprocal UE and LE movement level 3 x 6 min   Pt challenged to independently complete some exercises to improve his tightness in his T and L spine, provided with cues and instruciton form PT 1: All 4s PWR! Rock x 15  2: Open book stretch / PWR! Twist in prone position x15 ea side  3: LTR x 10 to ea side   PWR! Up in seated with GTB resistance 2 x 10 reps  PWR! Twist  seated x 10 to ea side   On airex beam PWR! Standing  -x 10 ea rock  -x 10 ea step   Ambulation x 150 ft with variable ball toss and handoff ( tosses forward / backward/ side hand offs      PATIENT EDUCATION: Education details: goals, POC Person educated: Patient Education method: Explanation, Demonstration, Tactile cues, and Verbal cues Education comprehension: verbalized understanding, returned demonstration, verbal cues required, and tactile cues required  HOME EXERCISE PROGRAM: Access Code: [email or web address] URL: [email or web address] Date: 04/27/2023 Prepared by: Precious Bard  Exercises - Single Leg Stance with Support  - 1 x daily - 7 x weekly - 2 sets - 2 reps - 30 hold - Seated Ankle Alphabet  - 1 x daily - 7 x weekly - 2 sets - 1 reps - 5 hold - Forward T with Counter Support  - 1 x daily - 7 x weekly - 2 sets - 10 reps - 5 hold  GOALS: Goals reviewed with patient? Yes  SHORT TERM GOALS: Target date: 05/13/2023    Patient will be independent in home exercise program to improve strength/mobility for better functional independence with ADLs. Baseline:7/31: give next session 9/23: completing daily  Goal status: INITIAL    LONG TERM GOALS: Target date: 07/08/2023   Patient will increase FOTO score to equal to or greater than  70%   to demonstrate statistically significant improvement in mobility and quality of life.  Baseline: 7/31: 60% 9/23: 73% Goal status: MET  2.  Patient will increase Functional Gait Assessment score to >28/30 as to reduce fall risk and improve dynamic gait safety with community ambulation. Baseline: 7/31: 17/30 9/23: 21/30 Goal status: ONGOING  3.  Patient will increase six minute walk test distance to >1500 for progression to age norm community ambulator and improve gait ability Baseline: 7/31: 1025 ft  9/23: 1485 ft  Goal status: ONGOING   4.  Patient will return to independent walking and gym program for return to  PLOF. Baseline: 7/31: not consisently walking, not going to gym. 9/23: Not returned yet, is doing HEP regularly.  Goal status: INITIAL    ASSESSMENT:  CLINICAL IMPRESSION:   Patient arrives motivated to participate with complaints of tightness. Session focused on large amplitude movements on unstable surfaces and training to alleviate tightness with simple home exercises he can complete daily. Patient tolerated session well with no complaints. Pt will continue to benefit from skilled physical therapy intervention to address impairments, improve QOL, and attain therapy goals.     OBJECTIVE IMPAIRMENTS: Abnormal gait, decreased activity tolerance, decreased balance, decreased coordination, decreased endurance, decreased mobility, difficulty walking, decreased strength, impaired flexibility, improper body mechanics, postural dysfunction, and pain.   ACTIVITY LIMITATIONS: carrying, lifting, bending, standing, squatting, stairs, transfers, dressing, locomotion level, and caring for others  PARTICIPATION LIMITATIONS: meal prep, cleaning, laundry, shopping, community activity, occupation, and yard work  PERSONAL FACTORS: Age, Fitness, Past/current experiences, Time since onset of injury/illness/exacerbation, and 3+  comorbidities: Depression, anxiety, arthritis, bilateral sciatica, insomnia, obesity, OSA, seizures, spinal stenosis, spondylolisthesis at L4-5, PTSD, and lymphadenopathy retroperitoneal  are also affecting patient's functional outcome.   REHAB POTENTIAL: Good  CLINICAL DECISION MAKING: Evolving/moderate complexity  EVALUATION COMPLEXITY: Moderate  PLAN:  PT FREQUENCY: 2x/week  PT DURATION: 12 weeks  PLANNED INTERVENTIONS: Therapeutic exercises, Therapeutic activity, Neuromuscular re-education, Balance training, Gait training, Patient/Family education, Self Care, Joint mobilization, Stair training, Vestibular training, Canalith repositioning, Visual/preceptual  remediation/compensation, Orthotic/Fit training, Dry Needling, Electrical stimulation, Spinal mobilization, Cryotherapy, Moist heat, Splintting, Taping, Traction, Manual therapy, and Re-evaluation  PLAN FOR NEXT SESSION: dual task, L foot stabilization, ankle righting reactions    Norman Herrlich, PT, DPT  07/01/2023, 8:09 AM

## 2023-07-01 ENCOUNTER — Ambulatory Visit: Payer: BC Managed Care – PPO | Admitting: Physical Therapy

## 2023-07-01 DIAGNOSIS — R262 Difficulty in walking, not elsewhere classified: Secondary | ICD-10-CM | POA: Diagnosis not present

## 2023-07-01 DIAGNOSIS — R2681 Unsteadiness on feet: Secondary | ICD-10-CM

## 2023-07-06 ENCOUNTER — Ambulatory Visit: Payer: BC Managed Care – PPO | Admitting: Physical Therapy

## 2023-07-08 ENCOUNTER — Ambulatory Visit: Payer: BC Managed Care – PPO | Admitting: Physical Therapy

## 2023-07-08 ENCOUNTER — Ambulatory Visit: Payer: BC Managed Care – PPO | Admitting: Psychiatry

## 2023-07-08 DIAGNOSIS — R2681 Unsteadiness on feet: Secondary | ICD-10-CM

## 2023-07-08 DIAGNOSIS — R262 Difficulty in walking, not elsewhere classified: Secondary | ICD-10-CM

## 2023-07-08 NOTE — Therapy (Signed)
OUTPATIENT PHYSICAL THERAPY NEURO TREATMENT/ DISCHARGE THERAPY    Patient Name: Dakota Davis MRN: [id number] DOB:Jun 22, 1966, 57 y.o., male Today's Date: 07/08/2023   PCP: Lauro Regulus  REFERRING PROVIDER: Lonell Face MD   END OF SESSION:  PT End of Session - 07/08/23 0806     Visit Number 16    Number of Visits 24    Date for PT Re-Evaluation 07/08/23    Progress Note Due on Visit 20    PT Start Time 0804    PT Stop Time 0844    PT Time Calculation (min) 40 min    Equipment Utilized During Treatment Gait belt    Activity Tolerance Patient tolerated treatment well    Behavior During Therapy WFL for tasks assessed/performed              Past Medical History:  Diagnosis Date   Anxiety    Anxiety    Arthritis    Depression    Hyperlipidemia    Insomnia    Lymphadenopathy, retroperitoneal    OSA (obstructive sleep apnea)    Parkinson disease (HCC)    Sciatica    Seizures (HCC)    Spinal stenosis    Spinal stenosis    Spondylisthesis    Past Surgical History:  Procedure Laterality Date   BACK SURGERY     BACK SURGERY     10/31/2016,12/21/2015   CARDIAC CATHETERIZATION     ESOPHAGOGASTRODUODENOSCOPY N/A 11/27/2021   Procedure: ESOPHAGOGASTRODUODENOSCOPY (EGD);  Surgeon: Toledo, Boykin Nearing, MD;  Location: [facility];  Service: Gastroenterology;  Laterality: N/A;   KNEE ARTHROSCOPY     2018   LEFT HEART CATH AND CORONARY ANGIOGRAPHY N/A 08/05/2017   Procedure: LEFT HEART CATH AND CORONARY ANGIOGRAPHY;  Surgeon: Lamar Blinks, MD;  Location: [facility];  Service: Cardiovascular;  Laterality: N/A;   TONSILECTOMY/ADENOIDECTOMY WITH MYRINGOTOMY     Patient Active Problem List   Diagnosis Date Noted   GAD (generalized anxiety disorder) 07/04/2018   PTSD (post-traumatic stress disorder) 07/04/2018   Stable angina (HCC) 08/03/2017    ONSET DATE: 2022  REFERRING DIAG: Parkinson's Disease  THERAPY DIAG:  No diagnosis  found.  Rationale for Evaluation and Treatment: Rehabilitation  SUBJECTIVE:                                                                                                                                                                                             SUBJECTIVE STATEMENT:   Pt reports feeling good today, no reports of new tightness at this time. Pt did mobility exercises this morning to help with his tightness.  Pt has no questions regarding home, gym based or rock steady program.    Pt accompanied by: self  PERTINENT HISTORY:   Patient presents to PT for evaluation of Parkinson's Disease. He has additional L shoulder pain s/p ACDF 11/2022. His Parkinson's has L sided resting tremor with some bilateral tremors. He has limited R knee ROM at baseline. PMH includes Depression, anxiety, arthritis, bilateral sciatica, insomnia, obesity, OSA, seizures, spinal stenosis, spondylolisthesis at L4-5, PTSD, and lymphadenopathy retroperitoneal. Patient reports primary issue on L side, difficulty picking L foot up, drop foot of L foot and heel, stumbles but no falls. Pivots are challenging. Has Dystonia with L foot, starting in R side in past two weeks. 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  PAIN:  Are you having pain?  Dystonia of L foot   PRECAUTIONS: Fall   WEIGHT BEARING RESTRICTIONS: No  FALLS: Has patient fallen in last 6 months?  Stumbles but no falls to ground.   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 Prior C4-C6 ACDF without evidence of  hardware complication. No residual high-grade stenosis. 2. Unchanged multilevel cervical spondylosis as described above. Unchanged moderate to severe right neuroforaminal stenosis at C3-C4. Unchanged mild left neuroforaminal stenosis at C2-C3 and C3-C4.   COGNITION: Overall cognitive status: Within functional limits for tasks assessed   SENSATION: WFL  COORDINATION: Heel slide: L slower Finger to nose test: L dysmetria and slower speed   POSTURE: rounded shoulders, increased thoracic kyphosis, and weight shift right   LOWER EXTREMITY MMT:    MMT Right Eval Left Eval  Hip flexion 5 4  Hip abduction 4 4-  Hip adduction 5 4  Knee flexion 5 4-  Knee extension 5 4-  Ankle dorsiflexion 5 4  Ankle plantarflexion 5 4  (Blank rows = not tested)    TRANSFERS: Assistive device utilized: None  Sit to stand: SBA Stand to sit: SBA   STAIRS: Level of Assistance: SBA Stair Negotiation Technique: Alternating Pattern  with Single Rail on Right Number of Stairs: 4  Height of Stairs: 6 inch  Comments: decreased clearance of LLE  GAIT: Gait pattern:    decreased L ankle DF, R trunk lean during R stance, limited L arm swing, narrow BOS, B decreased step length Distance walked: 1050' Assistive device utilized: None Level of assistance: CGA Comments: see above; L foot collapse with weightbearing.   FUNCTIONAL TESTS:  5 times sit to stand: 14.7 6 minute walk test: 1025 Functional gait assessment: 17/30  PATIENT SURVEYS:  FOTO 60  TODAY'S TREATMENT:  DATE: 07/08/23    Physical therapy treatment session today consisted of completing assessment of goals and administration of testing as demonstrated and documented in flow sheet, treatment, and goals section of this note. Addition treatments may be found below.   TRX lunges forward with chest stretch each  rep  2 x 10 ea LE  Horizontal abduction with cable machine 7.5# ea UE 2 x 10 reps    [facility] PT Assessment - 07/08/23 0001       Functional Gait  Assessment   Gait assessed  Yes    Gait Level Surface Walks 20 ft in less than 5.5 sec, no assistive devices, good speed, no evidence for imbalance, normal gait pattern, deviates no more than 6 in outside of the 12 in walkway width.    Change in Gait Speed Able to smoothly change walking speed without loss of balance or gait deviation. Deviate no more than 6 in outside of the 12 in walkway width.    Gait with Horizontal Head Turns Performs head turns smoothly with no change in gait. Deviates no more than 6 in outside 12 in walkway width    Gait with Vertical Head Turns Performs head turns with no change in gait. Deviates no more than 6 in outside 12 in walkway width.    Gait and Pivot Turn Pivot turns safely within 3 sec and stops quickly with no loss of balance.    Step Over Obstacle Is able to step over 2 stacked shoe boxes taped together (9 in total height) without changing gait speed. No evidence of imbalance.    Gait with Narrow Base of Support Is able to ambulate for 10 steps heel to toe with no staggering.    Gait with Eyes Closed Walks 20 ft, uses assistive device, slower speed, mild gait deviations, deviates 6-10 in outside 12 in walkway width. Ambulates 20 ft in less than 9 sec but greater than 7 sec.    Ambulating Backwards Walks 20 ft, no assistive devices, good speed, no evidence for imbalance, normal gait    Steps Alternating feet, must use rail.    Total Score 28                PATIENT EDUCATION: Education details: goals, POC Person educated: Patient Education method: Explanation, Demonstration, Tactile cues, and Verbal cues Education comprehension: verbalized understanding, returned demonstration, verbal cues required, and tactile cues required  HOME EXERCISE PROGRAM: Access Code: [email or web address] URL:  [email or web address] Date: 04/27/2023 Prepared by: Precious Bard  Exercises - Single Leg Stance with Support  - 1 x daily - 7 x weekly - 2 sets - 2 reps - 30 hold - Seated Ankle Alphabet  - 1 x daily - 7 x weekly - 2 sets - 1 reps - 5 hold - Forward T with Counter Support  - 1 x daily - 7 x weekly - 2 sets - 10 reps - 5 hold  GOALS: Goals reviewed with patient? Yes  SHORT TERM GOALS: Target date: 05/13/2023    Patient will be independent in home exercise program to improve strength/mobility for better functional independence with ADLs. Baseline:7/31: give next session 9/23: completing daily  Goal status: MET    LONG TERM GOALS: Target date: 07/08/2023   Patient will increase FOTO score to equal to or greater than  70%   to demonstrate statistically significant improvement in mobility and quality of life.  Baseline: 7/31: 60% 9/23: 73% Goal status: MET  2.  Patient will increase Functional Gait  Assessment score to >28/30 as to reduce fall risk and improve dynamic gait safety with community ambulation. Baseline: 7/31: 17/30 9/23: 21/30 10/23: 28 Goal status: MET  3.  Patient will increase six minute walk test distance to >1500 for progression to age norm community ambulator and improve gait ability Baseline: 7/31: 1025 ft  9/23: 1485 ft  10/23: 1540 ft  Goal status: MET  4.  Patient will return to independent walking and gym program for return to PLOF.  Baseline: 7/31: not consisently walking, not going to gym. 9/23: Not returned yet, is doing HEP regularly.  10/23: Pt is waiting for MD signature to start PD class, pt has been instructed in gym based HEP.  Goal status: PARTIALLY MET    ASSESSMENT:  CLINICAL IMPRESSION:   Pt presents for discharge therapy this date. Pt has met or nearly met all his goals and is comfortable with advanced Hep and advanced d/c plan for maintenance of his progress through gym program and rock steady boxing program. Pt to be  discharged from formal PT this date.     OBJECTIVE IMPAIRMENTS: Abnormal gait, decreased activity tolerance, decreased balance, decreased coordination, decreased endurance, decreased mobility, difficulty walking, decreased strength, impaired flexibility, improper body mechanics, postural dysfunction, and pain.   ACTIVITY LIMITATIONS: carrying, lifting, bending, standing, squatting, stairs, transfers, dressing, locomotion level, and caring for others  PARTICIPATION LIMITATIONS: meal prep, cleaning, laundry, shopping, community activity, occupation, and yard work  PERSONAL FACTORS: Age, Fitness, Past/current experiences, Time since onset of injury/illness/exacerbation, and 3+ comorbidities: Depression, anxiety, arthritis, bilateral sciatica, insomnia, obesity, OSA, seizures, spinal stenosis, spondylolisthesis at L4-5, PTSD, and lymphadenopathy retroperitoneal  are also affecting patient's functional outcome.   REHAB POTENTIAL: Good  CLINICAL DECISION MAKING: Evolving/moderate complexity  EVALUATION COMPLEXITY: Moderate  PLAN:  PT FREQUENCY: 2x/week  PT DURATION: 12 weeks  PLANNED INTERVENTIONS: Therapeutic exercises, Therapeutic activity, Neuromuscular re-education, Balance training, Gait training, Patient/Family education, Self Care, Joint mobilization, Stair training, Vestibular training, Canalith repositioning, Visual/preceptual remediation/compensation, Orthotic/Fit training, Dry Needling, Electrical stimulation, Spinal mobilization, Cryotherapy, Moist heat, Splintting, Taping, Traction, Manual therapy, and Re-evaluation  PLAN FOR NEXT SESSION: d/c all goals met or nearly met    Norman Herrlich, PT, DPT  07/08/2023, 8:08 AM

## 2023-07-14 ENCOUNTER — Ambulatory Visit: Payer: BC Managed Care – PPO | Admitting: Psychiatry

## 2023-07-17 ENCOUNTER — Other Ambulatory Visit: Payer: Self-pay | Admitting: Psychiatry

## 2023-07-17 NOTE — Telephone Encounter (Signed)
LF 10/7; LV  09/3

## 2023-07-27 ENCOUNTER — Other Ambulatory Visit: Payer: Self-pay | Admitting: Psychiatry

## 2023-09-14 ENCOUNTER — Encounter: Payer: Self-pay | Admitting: Psychiatry

## 2023-09-14 ENCOUNTER — Ambulatory Visit (INDEPENDENT_AMBULATORY_CARE_PROVIDER_SITE_OTHER): Payer: BC Managed Care – PPO | Admitting: Psychiatry

## 2023-09-14 DIAGNOSIS — F4310 Post-traumatic stress disorder, unspecified: Secondary | ICD-10-CM | POA: Diagnosis not present

## 2023-09-14 DIAGNOSIS — F3341 Major depressive disorder, recurrent, in partial remission: Secondary | ICD-10-CM

## 2023-09-14 DIAGNOSIS — G20C Parkinsonism, unspecified: Secondary | ICD-10-CM

## 2023-09-14 DIAGNOSIS — F411 Generalized anxiety disorder: Secondary | ICD-10-CM | POA: Diagnosis not present

## 2023-09-14 DIAGNOSIS — F5105 Insomnia due to other mental disorder: Secondary | ICD-10-CM

## 2023-09-14 MED ORDER — BUPROPION HCL ER (XL) 150 MG PO TB24: ORAL_TABLET | ORAL | 0 refills | Status: DC

## 2023-09-14 MED ORDER — SERTRALINE HCL 100 MG PO TABS: 200.0000 mg | ORAL_TABLET | Freq: Every day | ORAL | 0 refills | Status: DC

## 2023-09-14 NOTE — Patient Instructions (Signed)
Stop Rexulti Start bupropion 1 in the AM for 5-7 then 2 in the AM =300 mg in the AM

## 2023-09-14 NOTE — Progress Notes (Signed)
Dakota Davis [id number] April 23, 1966 57 y.o.  Subjective:   Patient ID:  Dakota Davis is a 57 y.o. (DOB [birth year]) male.  Chief Complaint:  Chief Complaint  Patient presents with   Follow-up   Depression   Anxiety   Stress    Health issues    Depression        Associated symptoms include no headaches.  Past medical history includes anxiety.   Anxiety Patient reports no dizziness, nervous/anxious behavior or palpitations.     Dakota Davis presents to the office today for follow-up of generalized anxiety disorder with elements of PTSD and major depression.  seen October 2020.  No meds were changed.  He remained on sertraline 200, aripiprazole 10 mg and Sonata or trazodone as needed insomnia.  January 11, 2020 appointment the following is noted: Had Covid Feb with flu-like sx and fatigue lasted several days. At times feels he's slipping into a bit more depression with less motivation and isolating.  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  Patient reports stable mood and denies depressed or irritable moods.  Patient with residual anxiety which sometimes interferes with sleep.  Sonata helps prn.  2-3 days a week can be hard to shut off brain.  Occ EFA.Marland Kitchen Usually 7-8 hour of sleep.  OCc daytime tiredness. Denies appetite disturbance.  Patient reports that energy and motivation have been good.  Patient denies any difficulty with concentration.  Patient denies any suicidal ideation. Plan:  He wanted to try Rexulti 2 mg augmentation  05/14/20 appt with the following noted: Rexulti for 2 mos helped but cost $250/mo copay and couldn't afford.  Felt worse off of it. Anxiety out the roof with uncertain cause. Seems worse than ever.  Varying subjects for worry.  Can't seem to get a hold of it.  Wife notices he's distant for weeks. Rexulti helped significantly.   Plan: sampled Rexulti  07/11/2020 appointment with the following noted: Been  able to stay on Rexulti and is better on it.  Clear difference.  Anxiety is better and manageable.  Mild depression comes and goes and manageable. No SE. Sleep not great with trouble staying asleep.  EFA with variable times.   Plan: Clearly better with Rexulti 2 mg in addition to sertraline 200 mg daily. Recommend a trial of trazodone at 100 250 mg nightly for insomnia  09/21/2020 phone call from patient stating he could no longer afford Rexulti.  He had already failed Abilify.  He is likely to be unable to afford the other partial dopamine agonist Vraylar.  Therefore initiated off label trial of pramipexole to take the place of the Rexulti.  Patient has a pending appointment in 3 weeks.  10/11/2020 appointment with the following noted: Ran out of Rexulti bc cost with new year was going to be $1000.   Overall still doing fine and has been since last seen.  Not noticed any changes so far with switch from Rexulti to pramipexole. Sleep is better.  Awakens but back to sleep.  Depression and anxiety are manageable. Patient reports stable mood and denies depressed or irritable moods.  Patient denies any recent difficulty with anxiety.  Patient denies difficulty with sleep initiation or maintenance. Denies appetite disturbance.  Patient reports that energy and motivation have been good.  Patient denies any difficulty with concentration.  Patient denies any suicidal ideation. No SE Plan: Clearly better with Rexulti 2 mg in addition to sertraline 200 mg daily. So far  has been OK with 3 week change to pramipexole but he could still relaps bc of long half life of Rexulti. Switch for sleep to trazodone.  01/07/21 appt noted: Ok overall.  Noticed some things he just wrote off like finger twitching and now resting tremor on R hand and when does something it goes away.  Went to PCP and awaiting referral to neurologist.  Also had some balance issues for some months also and wonders about PD.   Depression is OK.   Caffeine as late as bedtime.  Coke Zero and diet Mtn Dew. Plan: No med changes  03/19/2021 appointment with the following noted: Reviewed neuro note 02/21/2021 Dr. Sherryll Burger who suspects Parkinson's disease and started carbidopa levodopa. Maybe less stiffness with Sinemet. Pretty good re: mental health since here.  Trazodone helps some with sleep. No SE Plan: Continue pramipexole 0.25 mg BID Sertraline 200 daily trazodone 100 -200 mg HS helps go asleep but doesn't stay asleep..   Switch to decaf by 3 PM.  Emphasized this  07/22/21 appt noted: Still pretty well.  Depression and anxiety pretty under control. Continue meds as above. No SE CO EFA/EMA. Awakens after 3 hours. Sometimes back to sleep.  Has OSA and on CPAP. Usually works daytime so little napping. Plan: Continue pramipexole 0.25 mg BID Sertraline 200 daily Switch trazodone bc doesn't stay asleep to hydroxyzine 25-50 mg HS.Marland Kitchen  Also gave RX Belsomra samples in case it failed. 10-20 Switch to decaf by 3 PM.  Emphasized this  He's done this  11/12/21 appt noted: Hydroxyzine works but hangover  and lingers through the day with 25 mg. Belsomra also hangover but did keep him asleep better. Sleep same ith decent effect initially with trazodone.  Sleep is not as good as needed and some irritability.   Mood pretty fair without major problems. Patient reports stable mood and denies depressed or irritable moods.  Patient denies any recent difficulty with anxiety other than mild background levels.  His wife and her family are close and create stress.   Denies appetite disturbance.  Patient reports that energy and motivation have been good.  Patient denies any difficulty with concentration.  Patient denies any suicidal ideation. Plan: Continue pramipexole 0.25 mg BID Sertraline 200 daily Switch trazodone bc doesn't stay asleep to hydroxyzine 25-50 mg HS..  Trial Quiviviq 50 mg HS Switch to decaf by 3 PM.  Emphasized this  He's done  this  02/11/2022 appointment with the following noted: Can't get anything to help stay asleep.  Trazodone best so far bc without hangover. Not sure how much sleep.  Varies.  Last week 3 AM wide awake. Anxiety is higher than it was and more irritable without trigger.  Hard to shut off mind at night. Plan: Yes trial Restoril 15 mg 1-2 HS for EMA  04/14/22 appt noted: On temazepam  30 mg HS and off trazodone. On sertraline 200 mg  and pramipexole 0.25 mg BID and Sinemet. Still sleep px avg 3 hours.  Sleep terrible.  Trouble getting back to sleep. Back pain will awaken him and goes to LR and into chair.  Not always the cause.  No pain meds. Work up pending with hx fusion.  Also has nausea. 25th wedding anniversary and to [place] this week. Son 3 strokes as child from heart px as a kid. Plan: Yes trial clonazepam 0.5-1.0 mg HS Good to have switched DT recent dx PD.  Disc pramipexole in reference to dx PD.  Dr. Sherryll Burger ok  with it's low dose added on for off-label treatment of depression. Continue pramipexole 0.25 mg BID Sertraline 200 daily  07/01/2022 appointment noted: Continues above meds.  Also on Sinemet. Taking clonazepam 0.5 mg HS for sleep.  No hangover; no SE.  Still trouble staying asleep and EMA.  Really bad.  To bed 11 and up 230 for couple hours.  The next day not overy tired or sleep  4 hours at night without naps.Marland Kitchen  asks to increase clonazepam. Sometimes fuse a little shorter than should be but recognizes it and can manage it. Not sig depressed noor anxious Can tell PD is progressing somewhat.  Some dystonia at night with cramps at night.  Also more right tremor. Handling it with grace and information.  10/02/22 appt noted: Pretty good except still dealing with severe neck pain for a year.  Sees neuro for PD.  Tried lots of med and PT.  C spine MRI is abnormal.  First injection wihtout help.  Likely NS referral.  Pain interferes with sleep. Meds sertraline 200, clonazepam  1.0 mg HS, pramipexole 0.25 BID with PD. Mood dysphoric over the chronic pain but otherwise satisfied with current meds. No SE with meds.   Plan: Yes Nortriptyline 10 mg HS for neck pain.    03/03/23 appt noted: 3/4 Cervical fusion and didn't relieve pain.  And may need surg again on higher level.   Meds as above and nortriptyline 10 HS, and Rexulti 2 mg daily added. Ongoing neck pain and work up.   Knee pain too.  Hx a number of ortho surgeries.   Mood is more stable with Rexulti.   Sometimes too short tempered and knows he is doing it.  No outbursts of anger but can have time periods of agitation and sometimes without a reason.  Wife will notice it.  Probably for a couple of mos.   PD sx are generally mild.   Still some sleep benefit with clonazepam 1 mg HS but not as much as before. Avg 5 hour sleep, 7 more typical.  Pain can interfere.  Not always pain.   Not napping during the day. Asks about Cymbalta Plan: DC nortriptyline bc not helpful and polypharmacy. Plan: Stop nortriptyline. Reduce sertraline to 1 and 1/2 tablets daily and start duloxetine 1 daily for 5-7 days, Then reduce sertraline to 1 daily and increase duloxetine to 2 daily for 5/7 days, Then reduce sertraline 1/2 daily and increase duloxetine 3 daily for 5-7 days, Then stop sertraline. If it helps then stop the Rexulti. Increase clonazepam 1.5 mg HS but after a week if not better then reduce to 1 mg HS.  05/05/23 appt noted: Switched sertraline to duloxetine 90 mg  to see if it could help pain.  No problems with duloxetine except more sweating.  Always sweating pretty bad but worse.   Pain is coming back after cervical injection.  Still tingling side of face too. Stopped Rexulti with some worsening of emotional stability that wife notices. Some worsening of depression or anxiety but some anxiety over special needs son in senior year and his future makes him anxious and worries over it.    When anxious Parkinson's tremor  gets worse.  Will get tearful over this, gets overwhelmed bc don't know the answer.   Never tried clonazepam 1.5 mg HS and still has EMA 3 AM with 90 min awake time until back to sleep. No cause for awakening.   No SE with meds.   Neck pain 4-5/10.  No sig low back pain.  Not sure if emotions dulled with Zoloft but felt maybe it wasn't helping as much as in the past.  Hard to tell if more emotionality is related to med change or situation.    09/14/23 appt noted: Dx PD since here.   Re: med: switched back to sertraline from duloxetine And 2 weeks ago increased to sertraline 200, Rexulti 2, clonazepam 1.5 HS, pramipexole 0.25 BID, sinemet 25-100 TID, gabapentin 600 TID Neck operation again, C fusion Nov 20.  Recovery slower.  Just back to eating most foods. Dep been more heavy lately and not wanting to get OOB.  Wanting to go to bed early in the PM.  Feels like in a hole. Going on to a degree even before surgery without obvious cause. Wife notices the dep.   Friends from work noticed.   PT helped PD.    Past Psychiatric Medication Trials:  Sertraline 200, venlafaxine 225, paroxetine, duloxetine 90 sweating. Abilify 10, Rexulti 2,  Pramipexole 0.25 mg BID buspirone 30 twice daily,  doxazosin 8,   Sonata ? effect, trazodone 200 EMA, hydroxyzine 25 helped a little. hangover, Belsomra hangover, Quvivik NR & hangover, [place]igo NR, temazepam 30 NR Temazepam NR Clonazepam 1 mg HS no SE min response  Review of Systems:  Review of Systems  Cardiovascular:  Negative for palpitations.  Gastrointestinal:  Negative for abdominal pain.  Genitourinary:  Positive for frequency.  Musculoskeletal:  Positive for back pain and neck pain.  Neurological:  Positive for tremors. Negative for dizziness, weakness and headaches.       Balance and some stiffness  Psychiatric/Behavioral:  Positive for dysphoric mood and sleep disturbance. The patient is not nervous/anxious.     Medications: I have reviewed  the patient's current medications.  Current Outpatient Medications  Medication Sig Dispense Refill   buPROPion (WELLBUTRIN XL) 150 MG 24 hr tablet 1 in the AM for 1 week then 2 in the AM 30 tablet 0   carbidopa-levodopa (SINEMET IR) 25-100 MG tablet Take 1 tablet by mouth 3 (three) times daily.     clonazePAM (KLONOPIN) 1 MG tablet Take 1.5 tablets (1.5 mg total) by mouth at bedtime. 45 tablet 1   gabapentin (NEURONTIN) 300 MG capsule Take 600 mg by mouth 3 (three) times daily.     ondansetron (ZOFRAN-ODT) 4 MG disintegrating tablet Take 1 tablet (4 mg total) by mouth every 6 (six) hours as needed for nausea or vomiting. 20 tablet 0   pramipexole (MIRAPEX) 0.25 MG tablet Take 1 tablet (0.25 mg total) by mouth 2 (two) times daily. 180 tablet 0   rosuvastatin (CRESTOR) 20 MG tablet Take 20 mg by mouth daily.     dicyclomine (BENTYL) 20 MG tablet Take 1 tablet (20 mg total) by mouth 4 (four) times daily -  before meals and at bedtime for 10 days. 40 tablet 0   sertraline (ZOLOFT) 100 MG tablet Take 2 tablets (200 mg total) by mouth daily. 180 tablet 0   No current facility-administered medications for this visit.    Medication Side Effects: None  Allergies:  Allergies  Allergen Reactions   Adhesive [Tape] Other (See Comments)    Blisters    Codeine Nausea And Vomiting   Latex Other (See Comments)    blisters    Past Medical History:  Diagnosis Date   Anxiety    Anxiety    Arthritis    Depression    Hyperlipidemia    Insomnia    Lymphadenopathy,  retroperitoneal    OSA (obstructive sleep apnea)    Parkinson disease (HCC)    Sciatica    Seizures (HCC)    Spinal stenosis    Spinal stenosis    Spondylisthesis     History reviewed. No pertinent family history.  Social History   Socioeconomic History   Marital status: Married    Spouse name: Not on file   Number of children: Not on file   Years of education: Not on file   Highest education level: Not on file   Occupational History   Not on file  Tobacco Use   Smoking status: Never   Smokeless tobacco: Never  Vaping Use   Vaping status: Never Used  Substance and Sexual Activity   Alcohol use: Yes    Alcohol/week: 2.0 standard drinks of alcohol    Types: 2 Cans of beer per week    Comment: NONE LAST 24 HRS   Drug use: No   Sexual activity: Yes  Other Topics Concern   Not on file  Social History Narrative   Not on file   Social Drivers of Health   Financial Resource Strain: Low Risk  (06/30/2023)   Received from [facility]   Overall Financial Resource Strain (CARDIA)    Difficulty of Paying Living Expenses: Not hard at all  Food Insecurity: No Food Insecurity (06/30/2023)   Received from [facility]   Hunger Vital Sign    Worried About Running Out of Food in the Last Year: Never true    Ran Out of Food in the Last Year: Never true  Transportation Needs: No Transportation Needs (06/30/2023)   Received from [facility] - Transportation    In the past 12 months, has lack of transportation kept you from medical appointments or from getting medications?: No    Lack of Transportation (Non-Medical): No  Physical Activity: Not on file  Stress: Not on file  Social Connections: Not on file  Intimate Partner Violence: Not on file    Past Medical History, Surgical history, Social history, and Family history were reviewed and updated as appropriate.   Please see review of systems for further details on the patient's review from today.   Objective:   Physical Exam:  There were no vitals taken for this visit.  Physical Exam Constitutional:      General: He is not in acute distress.    Appearance: He is well-developed. He is obese.  Musculoskeletal:        General: No deformity.  Neurological:     Mental Status: He is alert and oriented to person, place, and time.     Coordination: Coordination normal.  Psychiatric:         Attention and Perception: Attention and perception normal. He does not perceive auditory or visual hallucinations.        Mood and Affect: Mood is anxious and depressed. Affect is not labile, angry, tearful or inappropriate.        Speech: Speech normal.        Behavior: Behavior normal.        Thought Content: Thought content normal. Thought content is not delusional. Thought content does not include homicidal or suicidal ideation. Thought content does not include suicidal plan.        Cognition and Memory: Cognition and memory normal.        Judgment: Judgment normal.     Comments: Insight intact. No delusions.  More dep talkative     Lab Review:     Component Value Date/Time   NA 136 08/31/2022 2334   K 3.4 (L) 08/31/2022 2334   CL 101 08/31/2022 2334   CO2 26 08/31/2022 2334   GLUCOSE 106 (H) 08/31/2022 2334   BUN 12 08/31/2022 2334   CREATININE 1.07 08/31/2022 2334   CALCIUM 8.9 08/31/2022 2334   PROT 7.4 08/31/2022 2334   ALBUMIN 4.3 08/31/2022 2334   AST 23 08/31/2022 2334   ALT 6 08/31/2022 2334   ALKPHOS 65 08/31/2022 2334   BILITOT 1.0 08/31/2022 2334   GFRNONAA >60 08/31/2022 2334       Component Value Date/Time   WBC 6.8 08/31/2022 2334   RBC 4.30 08/31/2022 2334   HGB 13.0 08/31/2022 2334   HCT 39.5 08/31/2022 2334   PLT 168 08/31/2022 2334   MCV 91.9 08/31/2022 2334   MCH 30.2 08/31/2022 2334   MCHC 32.9 08/31/2022 2334   RDW 13.0 08/31/2022 2334   LYMPHSABS 0.6 (L) 08/31/2022 2334   MONOABS 0.7 08/31/2022 2334   EOSABS 0.2 08/31/2022 2334   BASOSABS 0.1 08/31/2022 2334    No results found for: "POCLITH", "LITHIUM"   No results found for: "PHENYTOIN", "PHENOBARB", "VALPROATE", "CBMZ"   .res Assessment: Plan:    Dakota Davis" was seen today for follow-up, depression, anxiety and stress.  Diagnoses and all orders for this visit:  Depression, major, recurrent, in partial remission (HCC) -     buPROPion (WELLBUTRIN XL) 150 MG 24 hr  tablet; 1 in the AM for 1 week then 2 in the AM -     sertraline (ZOLOFT) 100 MG tablet; Take 2 tablets (200 mg total) by mouth daily.  Generalized anxiety disorder -     sertraline (ZOLOFT) 100 MG tablet; Take 2 tablets (200 mg total) by mouth daily.  PTSD (post-traumatic stress disorder) -     sertraline (ZOLOFT) 100 MG tablet; Take 2 tablets (200 mg total) by mouth daily.  Insomnia due to mental condition  Primary parkinsonism (HCC)      43 min face to face time with patient was spent on counseling and coordination of care.  Mood and anxiety are some worse lately.  Discussed potential metabolic side effects associated with atypical antipsychotics, as well as potential risk for movement side effects. Advised pt to contact office if movement side effects occur.  responded to Rexulti 2 mg with resolution of sx. Had to switch to pramipexole for cost reasonsbut now able to get Rexulti which  worked better and he's been able to get it again and pleased with it.     We discussed the short-term risks associated with benzodiazepines including sedation and increased fall risk among others.  Discussed long-term side effect risk including dependence, potential withdrawal symptoms, and the potential eventual dose-related risk of dementia.  But recent studies from 2020 dispute this association between benzodiazepines and dementia risk. Newer studies in 2020 do not support an association with dementia. Disc Bz risk with sleep bc anxiety with it and doesn't feel well and on max SSRI.  Disc pros and cons given failure of non-BZ for sleep.    Good to have switched DT recent dx PD.  Disc pramipexole in reference to dx PD.  Dr. Sherryll Burger ok with it's low dose added on for off-label treatment of depression. Continue pramipexole 0.25 mg BID  Continue sertraline 200 mg daily.   DC Rexulti. Trial Wellbutrin 150 Am for a week then 300 mg  AM  Increased clonazepam 1.5 mg HS but after a week if not better then  reduce to 1 mg HS to deal with EMA  decaf by 3 PM and He's done this   Disc SE in detail and SSRI withdrawal sx.  Supportive therapy dealing with son's future and fear of the future.   Supportive therapy dealing with neck  pain.    FU 2 mos.  Meredith Staggers, MD, DFAPA    Please see After Visit Summary for patient specific instructions.  Future Appointments  Date Time Provider Department Center  11/09/2023  9:00 AM Cottle, Steva Ready., MD [facility] None      No orders of the defined types were placed in this encounter.    -------------------------------

## 2023-09-15 ENCOUNTER — Other Ambulatory Visit: Payer: Self-pay | Admitting: Psychiatry

## 2023-09-15 DIAGNOSIS — F5105 Insomnia due to other mental disorder: Secondary | ICD-10-CM

## 2023-09-15 NOTE — Telephone Encounter (Signed)
Lv 12/30 lf 12/2  Please send for CC.

## 2023-09-28 ENCOUNTER — Other Ambulatory Visit: Payer: Self-pay | Admitting: Psychiatry

## 2023-09-28 DIAGNOSIS — F3341 Major depressive disorder, recurrent, in partial remission: Secondary | ICD-10-CM

## 2023-10-02 ENCOUNTER — Telehealth: Payer: Self-pay | Admitting: Psychiatry

## 2023-10-02 MED ORDER — BUPROPION HCL ER (XL) 300 MG PO TB24: 300.0000 mg | ORAL_TABLET | Freq: Every day | ORAL | 1 refills | Status: DC

## 2023-10-02 NOTE — Telephone Encounter (Signed)
Sent 300 mg XL to [facility].

## 2023-10-02 NOTE — Telephone Encounter (Signed)
Dakota Davis called at 9:35 to request a prescription for the 300mg  Wellbutrin.  He finished the 150mg  you gave him and now needs the new dose.  Appt 2/24.  Send to  [facility] - [place], [place] - [address]

## 2023-10-30 ENCOUNTER — Other Ambulatory Visit: Payer: Self-pay | Admitting: Psychiatry

## 2023-11-09 ENCOUNTER — Ambulatory Visit (INDEPENDENT_AMBULATORY_CARE_PROVIDER_SITE_OTHER): Payer: 59 | Admitting: Psychiatry

## 2023-11-09 ENCOUNTER — Encounter: Payer: Self-pay | Admitting: Psychiatry

## 2023-11-09 DIAGNOSIS — F4310 Post-traumatic stress disorder, unspecified: Secondary | ICD-10-CM

## 2023-11-09 DIAGNOSIS — M542 Cervicalgia: Secondary | ICD-10-CM

## 2023-11-09 DIAGNOSIS — F5105 Insomnia due to other mental disorder: Secondary | ICD-10-CM | POA: Diagnosis not present

## 2023-11-09 DIAGNOSIS — F3341 Major depressive disorder, recurrent, in partial remission: Secondary | ICD-10-CM

## 2023-11-09 DIAGNOSIS — F411 Generalized anxiety disorder: Secondary | ICD-10-CM

## 2023-11-09 DIAGNOSIS — G20C Parkinsonism, unspecified: Secondary | ICD-10-CM

## 2023-11-09 MED ORDER — CLONAZEPAM 1 MG PO TABS: 1.5000 mg | ORAL_TABLET | Freq: Every day | ORAL | 1 refills | Status: DC

## 2023-11-09 MED ORDER — SERTRALINE HCL 100 MG PO TABS: 200.0000 mg | ORAL_TABLET | Freq: Every day | ORAL | 1 refills | Status: DC

## 2023-11-09 MED ORDER — BUPROPION HCL ER (XL) 300 MG PO TB24: 300.0000 mg | ORAL_TABLET | Freq: Every day | ORAL | 1 refills | Status: DC

## 2023-11-09 NOTE — Progress Notes (Signed)
 Dakota Davis [id number] Aug 05, 1966 58 y.o.  Subjective:   Patient ID:  Dakota Davis is a 58 y.o. (DOB 1966-03-12) male.  Chief Complaint:  Chief Complaint  Patient presents with   Follow-up   Depression   Anxiety   Sleeping Problem    Depression        Associated symptoms include no headaches.  Past medical history includes anxiety.   Anxiety Patient reports no dizziness, nervous/anxious behavior or palpitations.     Dakota Davis presents to the office today for follow-up of generalized anxiety disorder with elements of PTSD and major depression.  seen October 2020.  No meds were changed.  He remained on sertraline 200, aripiprazole 10 mg and Sonata or trazodone as needed insomnia.  January 11, 2020 appointment the following is noted: Had Covid Feb with flu-like sx and fatigue lasted several days. At times feels he's slipping into a bit more depression with less motivation and isolating.  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  Patient reports stable mood and denies depressed or irritable moods.  Patient with residual anxiety which sometimes interferes with sleep.  Sonata helps prn.  2-3 days a week can be hard to shut off brain.  Occ EFA.Marland Kitchen Usually 7-8 hour of sleep.  OCc daytime tiredness. Denies appetite disturbance.  Patient reports that energy and motivation have been good.  Patient denies any difficulty with concentration.  Patient denies any suicidal ideation. Plan:  He wanted to try Rexulti 2 mg augmentation  05/14/20 appt with the following noted: Rexulti for 2 mos helped but cost $250/mo copay and couldn't afford.  Felt worse off of it. Anxiety out the roof with uncertain cause. Seems worse than ever.  Varying subjects for worry.  Can't seem to get a hold of it.  Wife notices he's distant for weeks. Rexulti helped significantly.   Plan: sampled Rexulti  07/11/2020 appointment with the following noted: Been able to  stay on Rexulti and is better on it.  Clear difference.  Anxiety is better and manageable.  Mild depression comes and goes and manageable. No SE. Sleep not great with trouble staying asleep.  EFA with variable times.   Plan: Clearly better with Rexulti 2 mg in addition to sertraline 200 mg daily. Recommend a trial of trazodone at 100 250 mg nightly for insomnia  09/21/2020 phone call from patient stating he could no longer afford Rexulti.  He had already failed Abilify.  He is likely to be unable to afford the other partial dopamine agonist Vraylar.  Therefore initiated off label trial of pramipexole to take the place of the Rexulti.  Patient has a pending appointment in 3 weeks.  10/11/2020 appointment with the following noted: Ran out of Rexulti bc cost with new year was going to be $1000.   Overall still doing fine and has been since last seen.  Not noticed any changes so far with switch from Rexulti to pramipexole. Sleep is better.  Awakens but back to sleep.  Depression and anxiety are manageable. Patient reports stable mood and denies depressed or irritable moods.  Patient denies any recent difficulty with anxiety.  Patient denies difficulty with sleep initiation or maintenance. Denies appetite disturbance.  Patient reports that energy and motivation have been good.  Patient denies any difficulty with concentration.  Patient denies any suicidal ideation. No SE Plan: Clearly better with Rexulti 2 mg in addition to sertraline 200 mg daily. So far has been OK with  3 week change to pramipexole but he could still relaps bc of long half life of Rexulti. Switch for sleep to trazodone.  01/07/21 appt noted: Ok overall.  Noticed some things he just wrote off like finger twitching and now resting tremor on R hand and when does something it goes away.  Went to PCP and awaiting referral to neurologist.  Also had some balance issues for some months also and wonders about PD.   Depression is OK.  Caffeine as  late as bedtime.  Coke Zero and diet Mtn Dew. Plan: No med changes  03/19/2021 appointment with the following noted: Reviewed neuro note 02/21/2021 Dr. Sherryll Burger who suspects Parkinson's disease and started carbidopa levodopa. Maybe less stiffness with Sinemet. Pretty good re: mental health since here.  Trazodone helps some with sleep. No SE Plan: Continue pramipexole 0.25 mg BID Sertraline 200 daily trazodone 100 -200 mg HS helps go asleep but doesn't stay asleep..   Switch to decaf by 3 PM.  Emphasized this  07/22/21 appt noted: Still pretty well.  Depression and anxiety pretty under control. Continue meds as above. No SE CO EFA/EMA. Awakens after 3 hours. Sometimes back to sleep.  Has OSA and on CPAP. Usually works daytime so little napping. Plan: Continue pramipexole 0.25 mg BID Sertraline 200 daily Switch trazodone bc doesn't stay asleep to hydroxyzine 25-50 mg HS.Marland Kitchen  Also gave RX Belsomra samples in case it failed. 10-20 Switch to decaf by 3 PM.  Emphasized this  He's done this  11/12/21 appt noted: Hydroxyzine works but hangover  and lingers through the day with 25 mg. Belsomra also hangover but did keep him asleep better. Sleep same ith decent effect initially with trazodone.  Sleep is not as good as needed and some irritability.   Mood pretty fair without major problems. Patient reports stable mood and denies depressed or irritable moods.  Patient denies any recent difficulty with anxiety other than mild background levels.  His wife and her family are close and create stress.   Denies appetite disturbance.  Patient reports that energy and motivation have been good.  Patient denies any difficulty with concentration.  Patient denies any suicidal ideation. Plan: Continue pramipexole 0.25 mg BID Sertraline 200 daily Switch trazodone bc doesn't stay asleep to hydroxyzine 25-50 mg HS..  Trial Quiviviq 50 mg HS Switch to decaf by 3 PM.  Emphasized this  He's done this  02/11/2022  appointment with the following noted: Can't get anything to help stay asleep.  Trazodone best so far bc without hangover. Not sure how much sleep.  Varies.  Last week 3 AM wide awake. Anxiety is higher than it was and more irritable without trigger.  Hard to shut off mind at night. Plan: Yes trial Restoril 15 mg 1-2 HS for EMA  04/14/22 appt noted: On temazepam  30 mg HS and off trazodone. On sertraline 200 mg  and pramipexole 0.25 mg BID and Sinemet. Still sleep px avg 3 hours.  Sleep terrible.  Trouble getting back to sleep. Back pain will awaken him and goes to LR and into chair.  Not always the cause.  No pain meds. Work up pending with hx fusion.  Also has nausea. 25th wedding anniversary and to [place] this week. Son 3 strokes as child from heart px as a kid. Plan: Yes trial clonazepam 0.5-1.0 mg HS Good to have switched DT recent dx PD.  Disc pramipexole in reference to dx PD.  Dr. Sherryll Burger ok with it's low dose  added on for off-label treatment of depression. Continue pramipexole 0.25 mg BID Sertraline 200 daily  07/01/2022 appointment noted: Continues above meds.  Also on Sinemet. Taking clonazepam 0.5 mg HS for sleep.  No hangover; no SE.  Still trouble staying asleep and EMA.  Really bad.  To bed 11 and up 230 for couple hours.  The next day not overy tired or sleep  4 hours at night without naps.Marland Kitchen  asks to increase clonazepam. Sometimes fuse a little shorter than should be but recognizes it and can manage it. Not sig depressed noor anxious Can tell PD is progressing somewhat.  Some dystonia at night with cramps at night.  Also more right tremor. Handling it with grace and information.  10/02/22 appt noted: Pretty good except still dealing with severe neck pain for a year.  Sees neuro for PD.  Tried lots of med and PT.  C spine MRI is abnormal.  First injection wihtout help.  Likely NS referral.  Pain interferes with sleep. Meds sertraline 200, clonazepam 1.0 mg HS,  pramipexole 0.25 BID with PD. Mood dysphoric over the chronic pain but otherwise satisfied with current meds. No SE with meds.   Plan: Yes Nortriptyline 10 mg HS for neck pain.    03/03/23 appt noted: 3/4 Cervical fusion and didn't relieve pain.  And may need surg again on higher level.   Meds as above and nortriptyline 10 HS, and Rexulti 2 mg daily added. Ongoing neck pain and work up.   Knee pain too.  Hx a number of ortho surgeries.   Mood is more stable with Rexulti.   Sometimes too short tempered and knows he is doing it.  No outbursts of anger but can have time periods of agitation and sometimes without a reason.  Wife will notice it.  Probably for a couple of mos.   PD sx are generally mild.   Still some sleep benefit with clonazepam 1 mg HS but not as much as before. Avg 5 hour sleep, 7 more typical.  Pain can interfere.  Not always pain.   Not napping during the day. Asks about Cymbalta Plan: DC nortriptyline bc not helpful and polypharmacy. Plan: Stop nortriptyline. Reduce sertraline to 1 and 1/2 tablets daily and start duloxetine 1 daily for 5-7 days, Then reduce sertraline to 1 daily and increase duloxetine to 2 daily for 5/7 days, Then reduce sertraline 1/2 daily and increase duloxetine 3 daily for 5-7 days, Then stop sertraline. If it helps then stop the Rexulti. Increase clonazepam 1.5 mg HS but after a week if not better then reduce to 1 mg HS.  05/05/23 appt noted: Switched sertraline to duloxetine 90 mg  to see if it could help pain.  No problems with duloxetine except more sweating.  Always sweating pretty bad but worse.   Pain is coming back after cervical injection.  Still tingling side of face too. Stopped Rexulti with some worsening of emotional stability that wife notices. Some worsening of depression or anxiety but some anxiety over special needs son in senior year and his future makes him anxious and worries over it.    When anxious Parkinson's tremor gets worse.   Will get tearful over this, gets overwhelmed bc don't know the answer.   Never tried clonazepam 1.5 mg HS and still has EMA 3 AM with 90 min awake time until back to sleep. No cause for awakening.   No SE with meds.   Neck pain 4-5/10.  No sig low  back pain.  Not sure if emotions dulled with Zoloft but felt maybe it wasn't helping as much as in the past.  Hard to tell if more emotionality is related to med change or situation.    09/14/23 appt noted: Dx PD since here.   Re: med: switched back to sertraline from duloxetine And 2 weeks ago increased to sertraline 200, Rexulti 2, clonazepam 1.5 HS, pramipexole 0.25 BID, sinemet 25-100 TID,  Neck operation again, C fusion Nov 20.  Recovery slower.  Just back to eating most foods. Dep been more heavy lately and not wanting to get OOB.  Wanting to go to bed early in the PM.  Feels like in a hole. Going on to a degree even before surgery without obvious cause. Wife notices the dep.   Friends from work noticed.   PT helped PD.   Plan: DC Rexulti. Trial Wellbutrin 150 Am for a week then 300 mg AM  11/09/23 appt noted: Adding Wellbutrin made a difference with less dep and anxiety. No problems stopping Rexulti.   No SE.   Dep under control.  No panic.  Bout as steady as in a long time. Sleep is better than in a while.  Slep ok.   No change in Sienmet.     Past Psychiatric Medication Trials:  Sertraline 200, venlafaxine 225, paroxetine, duloxetine 90 sweating. Abilify 10, Rexulti 2,  Pramipexole 0.25 mg BID buspirone 30 twice daily,  doxazosin 8,   Sonata ? effect, trazodone 200 EMA, hydroxyzine 25 helped a little. hangover, Belsomra hangover, Quvivik NR & hangover, Davigo NR, temazepam 30 NR Temazepam NR Clonazepam 1 mg HS no SE min response  Review of Systems:  Review of Systems  Cardiovascular:  Negative for palpitations.  Gastrointestinal:  Negative for abdominal pain.  Genitourinary:  Positive for frequency.  Musculoskeletal:   Positive for back pain and neck pain.  Neurological:  Positive for tremors. Negative for dizziness, weakness and headaches.       Balance and some stiffness  Psychiatric/Behavioral:  Positive for depression. Negative for dysphoric mood and sleep disturbance. The patient is not nervous/anxious.     Medications: I have reviewed the patient's current medications.  Current Outpatient Medications  Medication Sig Dispense Refill   carbidopa-levodopa (SINEMET IR) 25-100 MG tablet Take 1 tablet by mouth 3 (three) times daily.     ondansetron (ZOFRAN-ODT) 4 MG disintegrating tablet Take 1 tablet (4 mg total) by mouth every 6 (six) hours as needed for nausea or vomiting. 20 tablet 0   pramipexole (MIRAPEX) 0.25 MG tablet Take 1 tablet (0.25 mg total) by mouth 2 (two) times daily. 180 tablet 0   rosuvastatin (CRESTOR) 20 MG tablet Take 20 mg by mouth daily.     buPROPion (WELLBUTRIN XL) 300 MG 24 hr tablet Take 1 tablet (300 mg total) by mouth daily. 90 tablet 1   clonazePAM (KLONOPIN) 1 MG tablet Take 1.5 tablets (1.5 mg total) by mouth at bedtime. 135 tablet 1   dicyclomine (BENTYL) 20 MG tablet Take 1 tablet (20 mg total) by mouth 4 (four) times daily -  before meals and at bedtime for 10 days. 40 tablet 0   sertraline (ZOLOFT) 100 MG tablet Take 2 tablets (200 mg total) by mouth daily. 180 tablet 1   No current facility-administered medications for this visit.    Medication Side Effects: None  Allergies:  Allergies  Allergen Reactions   Adhesive [Tape] Other (See Comments)    Blisters    Codeine  Nausea And Vomiting   Latex Other (See Comments)    blisters    Past Medical History:  Diagnosis Date   Anxiety    Anxiety    Arthritis    Depression    Hyperlipidemia    Insomnia    Lymphadenopathy, retroperitoneal    OSA (obstructive sleep apnea)    Parkinson disease (HCC)    Sciatica    Seizures (HCC)    Spinal stenosis    Spinal stenosis    Spondylisthesis     History  reviewed. No pertinent family history.  Social History   Socioeconomic History   Marital status: Married    Spouse name: Not on file   Number of children: Not on file   Years of education: Not on file   Highest education level: Not on file  Occupational History   Not on file  Tobacco Use   Smoking status: Never   Smokeless tobacco: Never  Vaping Use   Vaping status: Never Used  Substance and Sexual Activity   Alcohol use: Yes    Alcohol/week: 2.0 standard drinks of alcohol    Types: 2 Cans of beer per week    Comment: NONE LAST 24 HRS   Drug use: No   Sexual activity: Yes  Other Topics Concern   Not on file  Social History Narrative   Not on file   Social Drivers of Health   Financial Resource Strain: Low Risk  (06/30/2023)   Received from [facility]   Overall Financial Resource Strain (CARDIA)    Difficulty of Paying Living Expenses: Not hard at all  Food Insecurity: No Food Insecurity (06/30/2023)   Received from [facility]   Hunger Vital Sign    Worried About Running Out of Food in the Last Year: Never true    Ran Out of Food in the Last Year: Never true  Transportation Needs: No Transportation Needs (06/30/2023)   [facility] - Transportation    In the past 12 months, has lack of transportation kept you from medical appointments or from getting medications?: No    Lack of Transportation (Non-Medical): No  Physical Activity: Not on file  Stress: Not on file  Social Connections: Not on file  Intimate Partner Violence: Not on file    Past Medical History, Surgical history, Social history, and Family history were reviewed and updated as appropriate.   Please see review of systems for further details on the patient's review from today.   Objective:   Physical Exam:  There were no vitals taken for this visit.  Physical Exam Constitutional:      General: He is not in acute  distress.    Appearance: He is well-developed. He is obese.  Musculoskeletal:        General: No deformity.  Neurological:     Mental Status: He is alert and oriented to person, place, and time.     Coordination: Coordination normal.  Psychiatric:        Attention and Perception: Attention and perception normal. He does not perceive auditory or visual hallucinations.        Mood and Affect: Mood is anxious. Mood is not depressed. Affect is not labile, angry, tearful or inappropriate.        Speech: Speech normal.        Behavior: Behavior normal.        Thought Content: Thought content normal. Thought content is not  delusional. Thought content does not include homicidal or suicidal ideation. Thought content does not include suicidal plan.        Cognition and Memory: Cognition and memory normal.        Judgment: Judgment normal.     Comments: Insight intact. No delusions.  Less dep talkative     Lab Review:     Component Value Date/Time   NA 136 08/31/2022 2334   K 3.4 (L) 08/31/2022 2334   CL 101 08/31/2022 2334   CO2 26 08/31/2022 2334   GLUCOSE 106 (H) 08/31/2022 2334   BUN 12 08/31/2022 2334   CREATININE 1.07 08/31/2022 2334   CALCIUM 8.9 08/31/2022 2334   PROT 7.4 08/31/2022 2334   ALBUMIN 4.3 08/31/2022 2334   AST 23 08/31/2022 2334   ALT 6 08/31/2022 2334   ALKPHOS 65 08/31/2022 2334   BILITOT 1.0 08/31/2022 2334   GFRNONAA >60 08/31/2022 2334       Component Value Date/Time   WBC 6.8 08/31/2022 2334   RBC 4.30 08/31/2022 2334   HGB 13.0 08/31/2022 2334   HCT 39.5 08/31/2022 2334   PLT 168 08/31/2022 2334   MCV 91.9 08/31/2022 2334   MCH 30.2 08/31/2022 2334   MCHC 32.9 08/31/2022 2334   RDW 13.0 08/31/2022 2334   LYMPHSABS 0.6 (L) 08/31/2022 2334   MONOABS 0.7 08/31/2022 2334   EOSABS 0.2 08/31/2022 2334   BASOSABS 0.1 08/31/2022 2334    No results found for: "POCLITH", "LITHIUM"   No results found for: "PHENYTOIN", "PHENOBARB", "VALPROATE", "CBMZ"    .res Assessment: Plan:    Dakota Davis" was seen today for follow-up, depression, anxiety and sleeping problem.  Diagnoses and all orders for this visit:  Depression, major, recurrent, in partial remission (HCC) -     buPROPion (WELLBUTRIN XL) 300 MG 24 hr tablet; Take 1 tablet (300 mg total) by mouth daily. -     sertraline (ZOLOFT) 100 MG tablet; Take 2 tablets (200 mg total) by mouth daily.  Generalized anxiety disorder -     sertraline (ZOLOFT) 100 MG tablet; Take 2 tablets (200 mg total) by mouth daily.  PTSD (post-traumatic stress disorder) -     buPROPion (WELLBUTRIN XL) 300 MG 24 hr tablet; Take 1 tablet (300 mg total) by mouth daily. -     sertraline (ZOLOFT) 100 MG tablet; Take 2 tablets (200 mg total) by mouth daily.  Insomnia due to mental condition -     clonazePAM (KLONOPIN) 1 MG tablet; Take 1.5 tablets (1.5 mg total) by mouth at bedtime.  Primary parkinsonism (HCC)  Neck pain       43 min face to face time with patient was spent on counseling and coordination of care.  Mood and anxiety are some worse lately.  Discussed potential metabolic side effects associated with atypical antipsychotics, as well as potential risk for movement side effects. Advised pt to contact office if movement side effects occur.  responded to Rexulti 2 mg with resolution of sx. Had to switch to pramipexole for cost reasonsbut now able to get Rexulti which  worked better and he's been able to get it again and pleased with it.     We discussed the short-term risks associated with benzodiazepines including sedation and increased fall risk among others.  Discussed long-term side effect risk including dependence, potential withdrawal symptoms, and the potential eventual dose-related risk of dementia.  But recent studies from 2020 dispute this association between benzodiazepines and dementia risk. Newer studies in  2020 do not support an association with dementia. Disc Bz risk with sleep bc  anxiety with it and doesn't feel well and on max SSRI.  Disc pros and cons given failure of non-BZ for sleep.    Good to have switched DT recent dx PD.  Disc pramipexole in reference to dx PD.  Dr. Sherryll Burger ok with it's low dose added on for off-label treatment of depression. Continue pramipexole 0.25 mg BID  Continue sertraline 200 mg daily.   DC Rexulti. Trial Wellbutrin 150 Am for a week then 300 mg AM  Increased clonazepam 1.5 mg HS but after a week if not better then reduce to 1 mg HS to deal with EMA  decaf by 3 PM and He's done this   Disc SE in detail and SSRI withdrawal sx.  Supportive therapy dealing with son's future and fear of the future.   Supportive therapy dealing with neck  pain.    FU 2 mos.  Meredith Staggers, MD, DFAPA    Please see After Visit Summary for patient specific instructions.  No future appointments.     No orders of the defined types were placed in this encounter.    -------------------------------

## 2024-03-07 ENCOUNTER — Other Ambulatory Visit: Payer: Self-pay | Admitting: Psychiatry

## 2024-03-07 DIAGNOSIS — F4310 Post-traumatic stress disorder, unspecified: Secondary | ICD-10-CM

## 2024-03-07 DIAGNOSIS — F411 Generalized anxiety disorder: Secondary | ICD-10-CM

## 2024-03-07 DIAGNOSIS — F3341 Major depressive disorder, recurrent, in partial remission: Secondary | ICD-10-CM

## 2024-03-08 ENCOUNTER — Ambulatory Visit: Payer: 59 | Admitting: Psychiatry

## 2024-04-14 ENCOUNTER — Encounter: Payer: Self-pay | Admitting: Psychiatry

## 2024-04-14 ENCOUNTER — Ambulatory Visit (INDEPENDENT_AMBULATORY_CARE_PROVIDER_SITE_OTHER): Admitting: Psychiatry

## 2024-04-14 DIAGNOSIS — F411 Generalized anxiety disorder: Secondary | ICD-10-CM

## 2024-04-14 DIAGNOSIS — F3341 Major depressive disorder, recurrent, in partial remission: Secondary | ICD-10-CM | POA: Diagnosis not present

## 2024-04-14 DIAGNOSIS — G20C Parkinsonism, unspecified: Secondary | ICD-10-CM

## 2024-04-14 DIAGNOSIS — F5105 Insomnia due to other mental disorder: Secondary | ICD-10-CM

## 2024-04-14 DIAGNOSIS — F4310 Post-traumatic stress disorder, unspecified: Secondary | ICD-10-CM | POA: Diagnosis not present

## 2024-04-14 MED ORDER — CLONAZEPAM 1 MG PO TABS: 1.5000 mg | ORAL_TABLET | Freq: Every day | ORAL | 1 refills | Status: AC

## 2024-04-14 MED ORDER — SERTRALINE HCL 100 MG PO TABS: 200.0000 mg | ORAL_TABLET | Freq: Every day | ORAL | 1 refills | Status: AC

## 2024-04-14 MED ORDER — BUPROPION HCL ER (XL) 300 MG PO TB24: 300.0000 mg | ORAL_TABLET | Freq: Every day | ORAL | 1 refills | Status: AC

## 2024-04-14 NOTE — Progress Notes (Signed)
 Dakota Davis [id number] Aug 04, 1966 58 y.o.  Subjective:   Patient ID:  Dakota Davis is a 58 y.o. (DOB [birth year]) male.  Chief Complaint:  Chief Complaint  Patient presents with   Follow-up   Depression   Anxiety    Dakota Davis presents to the office today for follow-up of generalized anxiety disorder with elements of PTSD and major depression.  seen October 2020.  No meds were changed.  He remained on sertraline  200, aripiprazole  10 mg and Sonata  or trazodone  as needed insomnia.  January 11, 2020 appointment the following is noted: Had Covid Feb with flu-like sx and fatigue lasted several days. At times feels he's slipping into a bit more depression with less motivation and isolating.  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  Patient reports stable mood and denies depressed or irritable moods.  Patient with residual anxiety which sometimes interferes with sleep.  Sonata  helps prn.  2-3 days a week can be hard to shut off brain.  Occ EFA.[name] Usually 7-8 hour of sleep.  OCc daytime tiredness. Denies appetite disturbance.  Patient reports that energy and motivation have been good.  Patient denies any difficulty with concentration.  Patient denies any suicidal ideation. Plan:  He wanted to try Rexulti  2 mg augmentation  05/14/20 appt with the following noted: Rexulti  for 2 mos helped but cost $250/mo copay and couldn't afford.  Felt worse off of it. Anxiety out the roof with uncertain cause. Seems worse than ever.  Varying subjects for worry.  Can't seem to get a hold of it.  Wife notices he's distant for weeks. Rexulti  helped significantly.   Plan: sampled Rexulti   07/11/2020 appointment with the following noted: Been able to stay on Rexulti  and is better on it.  Clear difference.  Anxiety is better and manageable.  Mild depression comes and goes and manageable. No SE. Sleep not great with trouble staying asleep.  EFA with variable  times.   Plan: Clearly better with Rexulti  2 mg in addition to sertraline  200 mg daily. Recommend a trial of trazodone  at 100 250 mg nightly for insomnia  09/21/2020 phone call from patient stating he could no longer afford Rexulti .  He had already failed Abilify .  He is likely to be unable to afford the other partial dopamine agonist Vraylar.  Therefore initiated off label trial of pramipexole  to take the place of the Rexulti .  Patient has a pending appointment in 3 weeks.  10/11/2020 appointment with the following noted: Ran out of Rexulti  bc cost with new year was going to be $1000.   Overall still doing fine and has been since last seen.  Not noticed any changes so far with switch from Rexulti  to pramipexole . Sleep is better.  Awakens but back to sleep.  Depression and anxiety are manageable. Patient reports stable mood and denies depressed or irritable moods.  Patient denies any recent difficulty with anxiety.  Patient denies difficulty with sleep initiation or maintenance. Denies appetite disturbance.  Patient reports that energy and motivation have been good.  Patient denies any difficulty with concentration.  Patient denies any suicidal ideation. No SE Plan: Clearly better with Rexulti  2 mg in addition to sertraline  200 mg daily. So far has been OK with 3 week change to pramipexole  but he could still relaps bc of long half life of Rexulti . Switch for sleep to trazodone .  01/07/21 appt noted: Ok overall.  Noticed some things he just wrote off like finger  twitching and now resting tremor on R hand and when does something it goes away.  Went to PCP and awaiting referral to neurologist.  Also had some balance issues for some months also and wonders about PD.   Depression is OK.  Caffeine as late as bedtime.  Coke Zero and diet Mtn Dew. Plan: No med changes  03/19/2021 appointment with the following noted: Reviewed neuro note 02/21/2021 Dr. Maree who suspects Parkinson's disease and started carbidopa  levodopa. Maybe less stiffness with Sinemet. Pretty good re: mental health since here.  Trazodone  helps some with sleep. No SE Plan: Continue pramipexole  0.25 mg BID Sertraline  200 daily trazodone  100 -200 mg HS helps go asleep but doesn't stay asleep..   Switch to decaf by 3 PM.  Emphasized this  07/22/21 appt noted: Still pretty well.  Depression and anxiety pretty under control. Continue meds as above. No SE CO EFA/EMA. Awakens after 3 hours. Sometimes back to sleep.  Has OSA and on CPAP. Usually works daytime so little napping. Plan: Continue pramipexole  0.25 mg BID Sertraline  200 daily Switch trazodone  bc doesn't stay asleep to hydroxyzine  25-50 mg HS.[name]  Also gave RX Belsomra samples in case it failed. 10-20 Switch to decaf by 3 PM.  Emphasized this  He's done this  11/12/21 appt noted: Hydroxyzine  works but hangover  and lingers through the day with 25 mg. Belsomra also hangover but did keep him asleep better. Sleep same ith decent effect initially with trazodone .  Sleep is not as good as needed and some irritability.   Mood pretty fair without major problems. Patient reports stable mood and denies depressed or irritable moods.  Patient denies any recent difficulty with anxiety other than mild background levels.  His wife and her family are close and create stress.   Denies appetite disturbance.  Patient reports that energy and motivation have been good.  Patient denies any difficulty with concentration.  Patient denies any suicidal ideation. Plan: Continue pramipexole  0.25 mg BID Sertraline  200 daily Switch trazodone  bc doesn't stay asleep to hydroxyzine  25-50 mg HS..  Trial Quiviviq 50 mg HS Switch to decaf by 3 PM.  Emphasized this  He's done this  02/11/2022 appointment with the following noted: Can't get anything to help stay asleep.  Trazodone  best so far bc without hangover. Not sure how much sleep.  Varies.  Last week 3 AM wide awake. Anxiety is higher than it was and  more irritable without trigger.  Hard to shut off mind at night. Plan: Yes trial Restoril  15 mg 1-2 HS for EMA  04/14/22 appt noted: On temazepam   30 mg HS and off trazodone . On sertraline  200 mg  and pramipexole  0.25 mg BID and Sinemet. Still sleep px avg 3 hours.  Sleep terrible.  Trouble getting back to sleep. Back pain will awaken him and goes to LR and into chair.  Not always the cause.  No pain meds. Work up pending with hx fusion.  Also has nausea. 25th wedding anniversary and to [place] this week. Son 3 strokes as child from heart px as a kid. Plan: Yes trial clonazepam  0.5-1.0 mg HS Good to have switched DT recent dx PD.  Disc pramipexole  in reference to dx PD.  Dr. Maree ok with it's low dose added on for off-label treatment of depression. Continue pramipexole  0.25 mg BID Sertraline  200 daily  07/01/2022 appointment noted: Continues above meds.  Also on Sinemet. Taking clonazepam  0.5 mg HS for sleep.  No hangover; no SE.  Still trouble staying asleep and EMA.  Really bad.  To bed 11 and up 230 for couple hours.  The next day not overy tired or sleep  4 hours at night without naps.[name]  asks to increase clonazepam . Sometimes fuse a little shorter than should be but recognizes it and can manage it. Not sig depressed noor anxious Can tell PD is progressing somewhat.  Some dystonia at night with cramps at night.  Also more right tremor. Handling it with grace and information.  10/02/22 appt noted: Pretty good except still dealing with severe neck pain for a year.  Sees neuro for PD.  Tried lots of med and PT.  C spine MRI is abnormal.  First injection wihtout help.  Likely NS referral.  Pain interferes with sleep. Meds sertraline  200, clonazepam  1.0 mg HS, pramipexole  0.25 BID with PD. Mood dysphoric over the chronic pain but otherwise satisfied with current meds. No SE with meds.   Plan: Yes Nortriptyline  10 mg HS for neck pain.    03/03/23 appt noted: 3/4 Cervical fusion  and didn't relieve pain.  And may need surg again on higher level.   Meds as above and nortriptyline  10 HS, and Rexulti  2 mg daily added. Ongoing neck pain and work up.   Knee pain too.  Hx a number of ortho surgeries.   Mood is more stable with Rexulti .   Sometimes too short tempered and knows he is doing it.  No outbursts of anger but can have time periods of agitation and sometimes without a reason.  Wife will notice it.  Probably for a couple of mos.   PD sx are generally mild.   Still some sleep benefit with clonazepam  1 mg HS but not as much as before. Avg 5 hour sleep, 7 more typical.  Pain can interfere.  Not always pain.   Not napping during the day. Asks about Cymbalta  Plan: DC nortriptyline  bc not helpful and polypharmacy. Plan: Stop nortriptyline . Reduce sertraline  to 1 and 1/2 tablets daily and start duloxetine  1 daily for 5-7 days, Then reduce sertraline  to 1 daily and increase duloxetine  to 2 daily for 5/7 days, Then reduce sertraline  1/2 daily and increase duloxetine  3 daily for 5-7 days, Then stop sertraline . If it helps then stop the Rexulti . Increase clonazepam  1.5 mg HS but after a week if not better then reduce to 1 mg HS.  05/05/23 appt noted: Switched sertraline  to duloxetine  90 mg  to see if it could help pain.  No problems with duloxetine  except more sweating.  Always sweating pretty bad but worse.   Pain is coming back after cervical injection.  Still tingling side of face too. Stopped Rexulti  with some worsening of emotional stability that wife notices. Some worsening of depression or anxiety but some anxiety over special needs son in senior year and his future makes him anxious and worries over it.    When anxious Parkinson's tremor gets worse.  Will get tearful over this, gets overwhelmed bc don't know the answer.   Never tried clonazepam  1.5 mg HS and still has EMA 3 AM with 90 min awake time until back to sleep. No cause for awakening.   No SE with meds.    Neck pain 4-5/10.  No sig low back pain.  Not sure if emotions dulled with Zoloft  but felt maybe it wasn't helping as much as in the past.  Hard to tell if more emotionality is related to med change or situation.  09/14/23 appt noted: Dx PD since here.   Re: med: switched back to sertraline  from duloxetine  And 2 weeks ago increased to sertraline  200, Rexulti  2, clonazepam  1.5 HS, pramipexole  0.25 BID, sinemet 25-100 TID,  Neck operation again, C fusion Nov 20.  Recovery slower.  Just back to eating most foods. Dep been more heavy lately and not wanting to get OOB.  Wanting to go to bed early in the PM.  Feels like in a hole. Going on to a degree even before surgery without obvious cause. Wife notices the dep.   Friends from work noticed.   PT helped PD.   Plan: DC Rexulti . Trial Wellbutrin  150 Am for a week then 300 mg AM  11/09/23 appt noted: Adding Wellbutrin  made a difference with less dep and anxiety. No problems stopping Rexulti .   No SE.   Dep under control.  No panic.  Bout as steady as in a long time. Sleep is better than in a while.  Slep ok.   No change in Sinemet.    04/14/24 appt noted: with son Briton Med: Wellbutrin  XL 300, sertraline  200, clonazepam  1.5 MG HS, also on  Sinemet for PD About as steady as it's been in a long time.  Dep and anxiety managed.  Really happy with that since Wellbutrin  300.   Sleep is not as good in last couple mos.  EMA 330 AM.  Might lay for an hour before back to sleep.  No reason for awakening.  Not usually drowsy until late afternoon sometimes.   No SE.   L sided HA with light sensitivity.  Remote hx migraine.  No NV.  No Aura.  HA a couple per week.  Can't tolerate heat.    Past Psychiatric Medication Trials:  Sertraline  200, venlafaxine 225, paroxetine, duloxetine  90 sweating. Abilify  10, Rexulti  2,  Pramipexole  0.25 mg BID buspirone 30 twice daily,  doxazosin 8,   Sonata  ? effect,  trazodone  200 EMA, hydroxyzine  25 helped a  little. hangover,  Belsomra hangover, Quvivik NR & hangover, [place] NR Temazepam  NR Clonazepam  1 mg HS no SE min response  Review of Systems:  Review of Systems  Constitutional:  Positive for diaphoresis.  Cardiovascular:  Negative for palpitations.  Gastrointestinal:  Negative for abdominal pain.  Genitourinary:  Positive for frequency.  Musculoskeletal:  Positive for back pain and neck pain.  Neurological:  Positive for tremors and headaches. Negative for dizziness and weakness.       Balance and some stiffness  Psychiatric/Behavioral:  Negative for dysphoric mood and sleep disturbance. The patient is not nervous/anxious.     Medications: I have reviewed the patient's current medications.  Current Outpatient Medications  Medication Sig Dispense Refill   carbidopa-levodopa (SINEMET IR) 25-100 MG tablet Take 1 tablet by mouth 3 (three) times daily.     dicyclomine  (BENTYL ) 20 MG tablet Take 1 tablet (20 mg total) by mouth 4 (four) times daily -  before meals and at bedtime for 10 days. 40 tablet 0   ondansetron  (ZOFRAN -ODT) 4 MG disintegrating tablet Take 1 tablet (4 mg total) by mouth every 6 (six) hours as needed for nausea or vomiting. 20 tablet 0   rosuvastatin (CRESTOR) 20 MG tablet Take 20 mg by mouth daily.     buPROPion  (WELLBUTRIN  XL) 300 MG 24 hr tablet Take 1 tablet (300 mg total) by mouth daily. 90 tablet 1   clonazePAM  (KLONOPIN ) 1 MG tablet Take 1.5 tablets (1.5 mg total) by mouth at bedtime. 135  tablet 1   sertraline  (ZOLOFT ) 100 MG tablet Take 2 tablets (200 mg total) by mouth daily. 180 tablet 1   No current facility-administered medications for this visit.    Medication Side Effects: None  Allergies:  Allergies  Allergen Reactions   Adhesive [Tape] Other (See Comments)    Blisters    Codeine Nausea And Vomiting   Latex Other (See Comments)    blisters    Past Medical History:  Diagnosis Date   Anxiety    Anxiety    Arthritis    Depression     Hyperlipidemia    Insomnia    Lymphadenopathy, retroperitoneal    OSA (obstructive sleep apnea)    Parkinson disease (HCC)    Sciatica    Seizures (HCC)    Spinal stenosis    Spinal stenosis    Spondylisthesis     History reviewed. No pertinent family history.  Social History   Socioeconomic History   Marital status: Married    Spouse name: Not on file   Number of children: Not on file   Years of education: Not on file   Highest education level: Not on file  Occupational History   Not on file  Tobacco Use   Smoking status: Never   Smokeless tobacco: Never  Vaping Use   Vaping status: Never Used  Substance and Sexual Activity   Alcohol use: Yes    Alcohol/week: 2.0 standard drinks of alcohol    Types: 2 Cans of beer per week    Comment: NONE LAST 24 HRS   Drug use: No   Sexual activity: Yes  Other Topics Concern   Not on file  Social History Narrative   Not on file   Social Drivers of Health   Financial Resource Strain: Low Risk  (06/30/2023)   Received from [facility]   Overall Financial Resource Strain (CARDIA)    Difficulty of Paying Living Expenses: Not hard at all  Food Insecurity: No Food Insecurity (06/30/2023)   [facility] from [facility]   Hunger Vital Sign    Within the past 12 months, you worried that your food would run out before you got the money to buy more.: Never true    Within the past 12 months, the food you bought just didn't last and you didn't have money to get more.: Never true  Transportation Needs: No Transportation Needs (06/30/2023)   Received from [facility] - Transportation    In the past 12 months, has lack of transportation kept you from medical appointments or from getting medications?: No    Lack of Transportation (Non-Medical): No  Physical Activity: Not on file  Stress: Not on file  Social Connections: Not on file  Intimate Partner Violence: Not on file     Past Medical History, Surgical history, Social history, and Family history were reviewed and updated as appropriate.   Please see review of systems for further details on the patient's review from today.   Objective:   Physical Exam:  There were no vitals taken for this visit.  Physical Exam Constitutional:      General: He is not in acute distress.    Appearance: He is well-developed. He is obese.  Musculoskeletal:        General: No deformity.  Neurological:     Mental Status: He is alert and oriented to person, place, and time.     Coordination: Coordination normal.  Psychiatric:  Attention and Perception: Attention and perception normal. He does not perceive auditory or visual hallucinations.        Mood and Affect: Mood is not anxious or depressed. Affect is not labile, angry, tearful or inappropriate.        Speech: Speech normal.        Behavior: Behavior normal.        Thought Content: Thought content normal. Thought content is not delusional. Thought content does not include homicidal or suicidal ideation. Thought content does not include suicidal plan.        Cognition and Memory: Cognition and memory normal.        Judgment: Judgment normal.     Comments: Insight intact. No delusions.  Dep resolved.  talkative     Lab Review:     Component Value Date/Time   NA 136 08/31/2022 2334   K 3.4 (L) 08/31/2022 2334   CL 101 08/31/2022 2334   CO2 26 08/31/2022 2334   GLUCOSE 106 (H) 08/31/2022 2334   BUN 12 08/31/2022 2334   CREATININE 1.07 08/31/2022 2334   CALCIUM 8.9 08/31/2022 2334   PROT 7.4 08/31/2022 2334   ALBUMIN 4.3 08/31/2022 2334   AST 23 08/31/2022 2334   ALT 6 08/31/2022 2334   ALKPHOS 65 08/31/2022 2334   BILITOT 1.0 08/31/2022 2334   GFRNONAA >60 08/31/2022 2334       Component Value Date/Time   WBC 6.8 08/31/2022 2334   RBC 4.30 08/31/2022 2334   HGB 13.0 08/31/2022 2334   HCT 39.5 08/31/2022 2334   PLT 168 08/31/2022 2334    MCV 91.9 08/31/2022 2334   MCH 30.2 08/31/2022 2334   MCHC 32.9 08/31/2022 2334   RDW 13.0 08/31/2022 2334   LYMPHSABS 0.6 (L) 08/31/2022 2334   MONOABS 0.7 08/31/2022 2334   EOSABS 0.2 08/31/2022 2334   BASOSABS 0.1 08/31/2022 2334    No results found for: POCLITH, LITHIUM   No results found for: PHENYTOIN, PHENOBARB, VALPROATE, CBMZ   .res Assessment: Plan:    Dakota Davis was seen today for follow-up, depression and anxiety.  Diagnoses and all orders for this visit:  Depression, major, recurrent, in partial remission (HCC) -     sertraline  (ZOLOFT ) 100 MG tablet; Take 2 tablets (200 mg total) by mouth daily. -     buPROPion  (WELLBUTRIN  XL) 300 MG 24 hr tablet; Take 1 tablet (300 mg total) by mouth daily.  Generalized anxiety disorder -     sertraline  (ZOLOFT ) 100 MG tablet; Take 2 tablets (200 mg total) by mouth daily.  PTSD (post-traumatic stress disorder) -     sertraline  (ZOLOFT ) 100 MG tablet; Take 2 tablets (200 mg total) by mouth daily. -     buPROPion  (WELLBUTRIN  XL) 300 MG 24 hr tablet; Take 1 tablet (300 mg total) by mouth daily.  Insomnia due to mental condition -     clonazePAM  (KLONOPIN ) 1 MG tablet; Take 1.5 tablets (1.5 mg total) by mouth at bedtime.  Primary parkinsonism (HCC)       30 min face to face time with patient was spent on counseling and coordination of care.  Mood and anxiety are smuch better with current med combo.  We discussed the short-term risks associated with benzodiazepines including sedation and increased fall risk among others.  Discussed long-term side effect risk including dependence, potential withdrawal symptoms, and the potential eventual dose-related risk of dementia.  But recent studies from 2020 dispute this association between benzodiazepines and dementia risk.  Newer studies in 2020 do not support an association with dementia. Disc Bz risk with sleep bc anxiety with it and doesn't feel well and on max SSRI.   Disc pros and cons given failure of non-BZ for sleep.    Good to have switched DT recent dx PD.    Continue sertraline  200 mg daily.  Wellbutrin  300 mg AM  Increased clonazepam  1.5 mg HS but after a week if not better then reduce to 1 mg HS to deal with EMA  decaf by 3 PM and He's done this   Disc SE in detail and SSRI withdrawal sx.  Supportive therapy dealing with son's future and fear of the future.   Supportive therapy dealing with neck  pain.    FU 4-6 mos.  Lorene Macintosh, MD, DFAPA    Please see After Visit Summary for patient specific instructions.  No future appointments.      No orders of the defined types were placed in this encounter.    -------------------------------

## 2024-05-09 NOTE — Progress Notes (Signed)
 Dakota Davis is a 58 y.o. male here for follow up of their medical problems  CHIEF COMPLAINT:  Follow up medical problems in the problem list and as discussed in the history and assessment areas as well as new complaints as listed.   Patient Active Problem List  Diagnosis  . Major depressive disorder, recurrent, in remission ()  . Spinal stenosis, lumbar region, with neurogenic claudication  . OSA (obstructive sleep apnea)  . Obesity (BMI 30-39.9), unspecified  . Retroperitoneal lymphadenopathy  . Primary osteoarthritis of left knee  . Vasovagal syncope  . Healthcare maintenance  . Tremor  . Chronic RUQ pain  . Constipation, chronic  . Dysphagia, pharyngeal  . Parkinson disease (CMS/HHS-HCC)  . Prediabetes     HISTORY OF PRESENT ILLNESS:  OSA (obstructive sleep apnea) Continues to use cpap consistently and is continuing to benefit from it's use.    Major depressive disorder, recurrent, in remission (CMS-HCC) Mood is doing well   Parkinson disease Neuro follows and stable on sinemet  Prediabetes Glucose is followed and controlled on meds   Past Medical History:  Diagnosis Date  . Anxiety 1999  . Arthritis   . Bilateral sciatica 12/21/2015  . COVID-19 10/2018  . Degeneration of lumbar intervertebral disc 12/21/2015  . Depression   . Insomnia   . Obesity (BMI 30-39.9), unspecified   . OSA (obstructive sleep apnea)    Diagnosed many years ago but was not able to use CPAP   . Osteoarthritis 12/21/2015  . Pure hypercholesterolemia   . Retroperitoneal lymphadenopathy 2017   Found incidentally.  Suggest repeat CT in 1 yr for further eval.  . Seizures (CMS/HHS-HCC) 07/2012   Though to be psychogenic by Neuro  . Spinal stenosis, lumbar region, with neurogenic claudication 12/21/2015  . Spondylolisthesis at L4-L5 level 10/31/2016  . Syncope and collapse 2019  . 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  Last seen in 2016 by neuro.  . Tremor 2022    Past Surgical History:  Procedure Laterality Date  . BACK SURGERY  2013  . LAMINOTOMY REEXPLORATION POSTERIOR LUMBAR W/NERVE DECOMP & DISCECTOMY Right 12/21/2015   Procedure: CENTRAL RIGHT REEXPLORATION LUMBAR DECOMPRESSION L4-5, L5-S1;  Surgeon: Debby Lorrene Kaufmann, MD;  Location: [facility] OR;  Service: Orthopedics;  Laterality: Right;  . LAMINOTOMY REEXPLORATION POSTERIOR LUMBAR W/NERVE DECOMP & DISCECTOMY Right 12/21/2015   Procedure: LAMINOTOMY REEXPLORATION EACH ADDITIONAL LUMBAR INTERSPACE; 36955 x2;  Surgeon: Debby Lorrene Kaufmann, MD;  Location: [facility] OR;  Service: Orthopedics;  Laterality: Right;  . LAMINECTOMY LUMBAR EXCISON/EVACUATION EXTRADURAL INTRASPINAL LESION Right 12/21/2015   Procedure: LAMINECTOMY FOR EXCISION OR EVACUATION OF INTRASPINAL LESION OTHER THAN NEOPLASM, EXTRADURAL; LUMBAR;  Surgeon: Debby Lorrene Kaufmann, MD;  Location: [facility] OR;  Service: Orthopedics;  Laterality: Right;  . FRACTURE SURGERY  2018   Lower Back  . POSTERIOR LUMBAR SPINE FUSION ONE LEVEL LATERAL TRANSVERSE TECHNIQUE Bilateral 10/31/2016   Procedure: BILATERAL REEXPLORATION LUMBAR DECOMPRESSION STABILIZATION SPINAL FUSION L4-5, L5-S1. LEFT ICBG.;  Surgeon: Debby Lorrene Kaufmann, MD;  Location[facility] OR;  Service: Orthopedics;  Laterality: Bilateral;  . INSTRUMENTATION POSTERIOR SPINE 3 TO 6 VERTEBRAL SEGMENTS Bilateral 10/31/2016   Procedure: POSTERIOR SEGMENTAL INSTRUMENTATION (EG, PEDICLE FIXATION, DUAL RODS WITH MULTIPLE HOOKS AND SUBLAMINAR WIRES); 3 TO 6 VERTEBRAL SEGMENTS (LIST IN ADDITION TO PRIMARY PROCEDURE);  Surgeon: Debby Lorrene Kaufmann, MD;  Location: [facility] OR;  Service: Orthopedics;  Laterality: Bilateral;  . LAMINECTOMY POSTERIOR  LUMBAR FACETECTOMY & FORAMINOTOMY W/DECOMP Bilateral 10/31/2016   Procedure: LAMINECTOMY, FACETECTOMY AND FORAMINOTOMY (UNILATERAL OR BILATERAL WITH DECOMPRESSION OF SPINAL CORD, CAUDA EQUINA AND/OR  NERVE ROOT(S), SINGLE VERTEBRAL SEGMENT; LUMBAR;  Surgeon: Debby Lorrene Kaufmann, MD;  Location: [facility] OR;  Service: Orthopedics;  Laterality: Bilateral;  . LAMINECTOMY POSTERIOR CERVICLE DECOMP W/FACETECTOMY & FORAMINOTOMY Bilateral 10/31/2016   Procedure: LAMINECTOMY, FACETECTOMY AND FORAMINOTOMY, SINGLE VERTEBRAL SEGMENT; EACH ADDITIONAL SEGMENT, CERVICAL, THORACIC, OR LUMBAR (LIST IN ADDITION TO PRIMARY PROCEDURE); 36951 x2;  Surgeon: Debby Lorrene Kaufmann, MD;  Location: [facility] OR;  Service: Orthopedics;  Laterality: Bilateral;  . AUTOGRAFT STRUCTURAL ICBG OBTAINED SEPARATE INCISION FOR SPINE SURGERY Left 10/31/2016   Procedure: AUTOGRAFT FOR SPINE SURGERY ONLY (INCL HARVESTING GRAFT); STRUCTURAL, BICORTICAL OR TRICORTICAL (THROUGH SEPARATE SKIN OR FASCIAL INCISION) (LIST IN ADDITION TO PRIMARY PROCEDURE);  Surgeon: Debby Lorrene Kaufmann, MD;  Location: [facility] OR;  Service: Orthopedics;  Laterality: Left;  . COLONOSCOPY  12/11/2017   Normal/Repeat in 10 yrs/TKT  . EGD  11/27/2021   Gastritis/Esophagus dilated/No repeat/TKT  . ANTERIOR / POSTERIOR COMBINED FUSION CERVICAL SPINE  08/05/2023  . KNEE ARTHROSCOPY Left    x3  . KNEE ARTHROSCOPY Right    x1  . SPINE SURGERY  2013, 2014   2018  . TONSILLECTOMY       No fever chills or sweats   No nausea, vomiting or diarrhea  No chest pain, shortness of breath   Social History   Socioeconomic History  . Marital status: Married  Tobacco Use  . Smoking status: Never  . Smokeless tobacco: Never  Vaping Use  . Vaping status: Never Used  Substance and Sexual Activity  . Alcohol use: Yes    Alcohol/week: 2.0 standard drinks of alcohol    Types: 2 Cans of beer per week  . Drug use: No  . Sexual activity: Yes    Partners: Female   Social Drivers of Corporate Investment Banker Strain: Low Risk  (06/30/2023)   Overall Financial Resource Strain (CARDIA)   . Difficulty of Paying Living Expenses: Not hard at all  Food Insecurity: No Food Insecurity  (06/30/2023)   Hunger Vital Sign   . Worried About Programme Researcher, Broadcasting/film/video in the Last Year: Never true   . Ran Out of Food in the Last Year: Never true  Transportation Needs: No Transportation Needs (06/30/2023)   PRAPARE - Transportation   . Lack of Transportation (Medical): No   . Lack of Transportation (Non-Medical): No  Housing Stability: Unknown (10/20/2023)   Housing Stability Vital Sign   . Unable to Pay for Housing in the Last Year: No   . Homeless in the Last Year: No      Current Outpatient Medications:  .  buPROPion  (WELLBUTRIN  XL) 300 MG XL tablet, Take 1 tablet by mouth once daily, Disp: , Rfl:  .  carbidopa-levodopa (SINEMET CR) 25-100 mg CR tablet, Take 2 tablets by mouth 4 (four) times daily, Disp: 720 tablet, Rfl: 3 .  clonazePAM  (KLONOPIN ) 1 MG tablet, Take 1 mg by mouth at bedtime as needed for Anxiety, Disp: , Rfl:  .  rosuvastatin (CRESTOR) 20 MG tablet, TAKE 1 TABLET BY MOUTH DAILY, Disp: 90 tablet, Rfl: 3 .  semaglutide (WEGOVY) 1.7 mg/0.75 mL pen injector, Inject 0.75 mLs (1.7 mg total) subcutaneously once a week Compounded ok, Disp: 3 mL, Rfl: 0 .  sertraline  (ZOLOFT ) 100 MG tablet, Take 200 mg by mouth once daily., Disp: , Rfl:  .  brexpiprazole  (REXULTI ) 2  mg tablet, Take 1 tablet by mouth once daily (Patient not taking: Reported on 10/22/2023), Disp: , Rfl:  .  carbidopa-levodopa (SINEMET) 25-100 mg tablet, Take 2 tablets by mouth 3 (three) times daily for 90 days (Patient not taking: Reported on 03/07/2024), Disp: 540 tablet, Rfl: 1 .  clonazePAM  (KLONOPIN ) 0.5 MG tablet, Take 0.25 mg by mouth 2 (two) times daily as needed, Disp: , Rfl:   Vitals:   05/09/24 1444  BP: 122/79  Pulse: 78  Resp: 16   Body mass index is 32.08 kg/m. No acute distress Lungs; clear to ascultation Heart; Regular rate and rhythm  Abdomen; Soft and flat, normal bowel sounds Extremities; No clubbing, cyanosis or edema  Appointment on 04/26/2024  Component Date Value Ref Range Status   . Glucose 04/26/2024 90  70 - 110 mg/dL Final  . Sodium 91/87/7974 140  136 - 145 mmol/L Final  . Potassium 04/26/2024 3.9  3.6 - 5.1 mmol/L Final  . Chloride 04/26/2024 105  97 - 109 mmol/L Final  . Carbon Dioxide (CO2) 04/26/2024 28.5  22.0 - 32.0 mmol/L Final  . Urea Nitrogen (BUN) 04/26/2024 11  7 - 25 mg/dL Final  . Creatinine 91/87/7974 1.2  0.7 - 1.3 mg/dL Final  . Glomerular Filtration Rate (eGFR) 04/26/2024 71  >60 mL/min/1.73sq m Final  . Calcium 04/26/2024 9.5  8.7 - 10.3 mg/dL Final  . AST  91/87/7974 18  8 - 39 U/L Final  . ALT  04/26/2024 13  6 - 57 U/L Final  . Alk Phos (alkaline Phosphatase) 04/26/2024 60  34 - 104 U/L Final  . Albumin 04/26/2024 4.5  3.5 - 4.8 g/dL Final  . Bilirubin, Total 04/26/2024 0.4  0.3 - 1.2 mg/dL Final  . Protein, Total 04/26/2024 6.8  6.1 - 7.9 g/dL Final  . A/G Ratio 91/87/7974 2.0  1.0 - 5.0 gm/dL Final  . Hemoglobin J8R 04/26/2024 5.4  4.2 - 5.6 % Final  . Average Blood Glucose (Calc) 04/26/2024 108  mg/dL Final  . Cholesterol, Total 04/26/2024 179  100 - 200 mg/dL Final  . Triglyceride 91/87/7974 294 (H)  35 - 199 mg/dL Final  . HDL (High Density Lipoprotein) Cho* 04/26/2024 42.5  29.0 - 71.0 mg/dL Final  . LDL Calculated 04/26/2024 78  0 - 130 mg/dL Final  . VLDL Cholesterol 04/26/2024 59  mg/dL Final  . Cholesterol/HDL Ratio 04/26/2024 4.2   Final  . PSA (Prostate Specific Antigen), T* 04/26/2024 1.55  0.10 - 4.00 ng/mL Final    ASSESSMENT  AND PLAN:  Diagnoses and all orders for this visit:  OSA (obstructive sleep apnea) Assessment & Plan: Continues to use cpap consistently and is continuing to benefit from it's use.     Major depressive disorder, recurrent, in remission () Assessment & Plan: Mood is doing well   Orders: -     Comprehensive Metabolic Panel (CMP); Future -     Hemoglobin A1C; Future -     Lipid Panel w/calc LDL; Future -     Microalbumin/Creatinine Ratio, Random Urine; Future  Parkinson's disease,  unspecified whether dyskinesia present, unspecified whether manifestations fluctuate (CMS/HHS-HCC) Assessment & Plan: Neuro follows and stable on sinemet   Prediabetes Assessment & Plan: Glucose is followed and controlled on meds   Orders: -     Comprehensive Metabolic Panel (CMP); Future -     Hemoglobin A1C; Future -     Lipid Panel w/calc LDL; Future -     Microalbumin/Creatinine Ratio, Random Urine; Future  Other orders -     semaglutide (WEGOVY) 1.7 mg/0.75 mL pen injector; Inject 0.75 mLs (1.7 mg total) subcutaneously once a week Compounded ok

## 2024-07-20 ENCOUNTER — Ambulatory Visit: Payer: Self-pay | Attending: Gastroenterology | Admitting: Occupational Therapy

## 2024-07-20 DIAGNOSIS — R278 Other lack of coordination: Secondary | ICD-10-CM | POA: Diagnosis present

## 2024-07-20 DIAGNOSIS — R1312 Dysphagia, oropharyngeal phase: Secondary | ICD-10-CM | POA: Diagnosis present

## 2024-07-20 DIAGNOSIS — M6281 Muscle weakness (generalized): Secondary | ICD-10-CM | POA: Diagnosis present

## 2024-07-20 DIAGNOSIS — R471 Dysarthria and anarthria: Secondary | ICD-10-CM | POA: Diagnosis present

## 2024-07-20 NOTE — Therapy (Addendum)
 OUTPATIENT OCCUPATIONAL THERAPY NEURO EVALUATION  Patient Name: Dakota Davis MRN: [id number] DOB:1966-09-03, 58 y.o., male Today's Date: 07/20/2024  PCP: Lenon Layman ORN, MD REFERRING PROVIDER: Onita Elspeth Sharper, MD  END OF SESSION:  OT End of Session - 07/20/24 2243     Visit Number 1    Number of Visits 12    Date for Recertification  10/12/24    OT Start Time 0850    OT Stop Time 0945    OT Time Calculation (min) 55 min    Activity Tolerance Patient tolerated treatment well    Behavior During Therapy [facility] for tasks assessed/performed          Past Medical History:  Diagnosis Date   Anxiety    Anxiety    Arthritis    Depression    Hyperlipidemia    Insomnia    Lymphadenopathy, retroperitoneal    OSA (obstructive sleep apnea)    Parkinson disease (HCC)    Sciatica    Seizures (HCC)    Spinal stenosis    Spinal stenosis    Spondylisthesis    Past Surgical History:  Procedure Laterality Date   BACK SURGERY     BACK SURGERY     10/31/2016,12/21/2015   CARDIAC CATHETERIZATION     ESOPHAGOGASTRODUODENOSCOPY N/A 11/27/2021   Procedure: ESOPHAGOGASTRODUODENOSCOPY (EGD);  Surgeon: Toledo, Ladell POUR, MD;  Location: [facility] ENDOSCOPY;  Service: Gastroenterology;  Laterality: N/A;   KNEE ARTHROSCOPY     2018   LEFT HEART CATH AND CORONARY ANGIOGRAPHY N/A 08/05/2017   Procedure: LEFT HEART CATH AND CORONARY ANGIOGRAPHY;  Surgeon: Hester Wolm PARAS, MD;  Location: [facility] INVASIVE CV LAB;  Service: Cardiovascular;  Laterality: N/A;   TONSILECTOMY/ADENOIDECTOMY WITH MYRINGOTOMY     Patient Active Problem List   Diagnosis Date Noted   GAD (generalized anxiety disorder) 07/04/2018   PTSD (post-traumatic stress disorder) 07/04/2018   Stable angina 08/03/2017    ONSET DATE: 09/2020  REFERRING DIAG: Parkinson's disease  THERAPY DIAG:  Muscle weakness (generalized)  Other lack of coordination  Rationale for Evaluation and Treatment:  Rehabilitation  SUBJECTIVE:   SUBJECTIVE STATEMENT: Reports having some upcoming insurance changes Pt accompanied by: self  PERTINENT HISTORY: Patient is a 58 year old male with a diagnosis of Parkinson's disease.  Patient was referred to occupational therapy for limited fine motor coordination skills and bilateral hand tremors.  Patient's hand tremors started with the left  hand with a gradual progression to the right hand.  [name].  Patient has a recent history of severe right wrist and hand pain and has a upcoming orthopedic consult early next week on 07/25/2024.  Patient reports having a history of a triple cervical fusion last year.  Past medical history includes OSA, major depressive disorder (current), and prediabetes.  PRECAUTIONS: None  WEIGHT BEARING RESTRICTIONS: No  PAIN:  Are you having pain? Right wrist and hand 4/10;  Gets to 9/10 at its worst at home; throbbing pain  FALLS: Has patient fallen in last 6 months? No  LIVING ENVIRONMENT: Lives with: [name] with wife, [name], and youngest son Lives in: House/apartment Stairs:  two levels, 3 little steps from garage Has following equipment at home: None  PLOF: Independent  PATIENT GOALS:  To improve FMC   OBJECTIVE:  Note: Objective measures were completed at Evaluation unless otherwise noted.  HAND DOMINANCE: Right  ADLs:  Transfers/ambulation related to ADLs: Independent Eating: Increased time, difficulty at times cutting meat, Independent holding a  full drink-two hands initially 2/2 onset of right hand pain Grooming: Independent UB Dressing: Independent LB Dressing: Independent; occasionally has to redo tying shoes. 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 Leisure: Being outside, yard work, reading history research    MOBILITY STATUS: Independent  POSTURE COMMENTS:  Intact  ACTIVITY TOLERANCE: Activity tolerance: Depends on the task he is doing, typically approximately 30 min.  FUNCTIONAL OUTCOME MEASURES: MAM-20: 65/80  UPPER EXTREMITY ROM:  *History of left shoulder tears.  Active ROM Right Eval WFL Left Eval [facility]  Shoulder flexion    Shoulder abduction    Shoulder adduction    Shoulder extension    Shoulder internal rotation    Shoulder external rotation    Elbow flexion    Elbow extension    Wrist flexion    Wrist extension    Wrist ulnar deviation    Wrist radial deviation    Wrist pronation    Wrist supination    (Blank rows = not tested)  UPPER EXTREMITY MMT:     MMT Right eval Left eval  Shoulder flexion 5 5  Shoulder abduction 5 5  Shoulder adduction    Shoulder extension    Shoulder internal rotation    Shoulder external rotation    Middle trapezius    Lower trapezius    Elbow flexion 5 5  Elbow extension 5 5  Wrist flexion 4- 5  Wrist extension 4- 5  Wrist ulnar deviation    Wrist radial deviation    Wrist pronation 5 5  Wrist supination 5 5  (Blank rows = not tested)  HAND FUNCTION: Grip strength: Right: 94 lbs; Left: 112 lbs, Lateral pinch: Right: 38 lbs, Left: 37 lbs, and 3 point pinch: Right: 32 lbs, Left: 25 lbs  COORDINATION: 9 Hole Peg test: Right: 28 sec; Left: 37 sec  SENSATION: Light touch: WFL Proprioception: WFL  EDEMA: Intact  MUSCLE TONE: WFL  COGNITION: Overall cognitive status: Patient reports having [name] to concentrate while talking, as he loses his train of thoughts more frequently.  Patient reports concern about the the volume of his voice  has changing. Patient also reports changes in moving food through his mouth in preparation for swallowing.  A a referral for speech therapy consult was requested  VISION: Patient reports planning to follow-up with his eye-care physician                                                                                                                      TREATMENT DATE:07/20/2024  OT initial evaluation was completed, and Pt. education was provided as indicated below.   PATIENT EDUCATION: Education details: OT services, POC, goals and ADL/IADL  functional Status.  Person educated: Patient Education method: Explanation, Demonstration, Tactile cues, and Verbal cues Education comprehension: verbalized understanding, returned demonstration, verbal cues required, tactile cues required, and needs further education  HOME EXERCISE PROGRAM:   Continue to assess ongoing need for HEPs, and provide/upgrade as indicated.    GOALS: Goals reviewed with patient? Yes  SHORT TERM GOALS: Target date: 08/31/2024   Patient will be independent with HEPs for BUE functioning. Baseline: Eval: Current home exercise program Goal status: INITIAL  LONG TERM GOALS: Target date: 10/12/2024  Patient will independently and efficiently be able to cut meat. Baseline: Eval: Patient presents with difficulty cutting meat efficiently Goal status: INITIAL  2.  Patient will independently grasp, and manipulate multiple medication pills efficiently with his right hand Baseline: Eval: Pt. Has difficulty manipulating to pills at a time efficiently in his hand. Goal status: INITIAL  3.  Patient will independently and efficiently be able to clip nails Baseline: Eval: Patient scored 2-3/4 for cutting nails with a nail clipper portion on the Mam-20 Goal status: INITIAL  4.  Patient will independently and efficiently food on his plate Baseline: Eval: Patient has difficulty cutting meat. Goal status: INITIAL  5.  Patient will  grasp 1 item efficiently out of a full pocket Baseline: Eval: Patient has difficulty grasping 1 item efficiently from multiple items in his pocket Goal status: INITIAL  6.  Patient will efficiently write a 3 sentence paragraph with 75% legibility and minimal micrographia  Baseline: Eval: Name only 50% legibility with moderate micrographia Goal status: INITIAL  ASSESSMENT:  CLINICAL IMPRESSION:  Patient is a 58 y.o. male who was seen today for occupational therapy evaluation for Parkinson's disease.  Patient presents with bilateral tremors with the left being worse than the right. Patient presents with limited right dominant wrist strength,  decreased right hip strength, impaired motor control and fine motor coordination skills.  These limitations affect his ability to efficiently manage his medication, cut meat on his plate, efficiently grasp items from his pant pocket, his nails and write legibly.  MAM-20 score: 65/80. Patient will benefit from OT services to work on improving hand function skills in order to maximize overall independence with ADLs and IADL tasks  PERFORMANCE DEFICITS: in functional skills including ADLs, IADLs, coordination, dexterity, ROM, strength, pain, Fine motor control, and Gross motor control, cognitive skills including attention and memory, and psychosocial skills including coping strategies, environmental adaptation, interpersonal interactions, and routines and behaviors.   IMPAIRMENTS: are limiting patient from ADLs, IADLs, and leisure.   CO-MORBIDITIES: may have co-morbidities  that affects occupational performance. Patient will benefit from skilled OT to address above impairments and improve overall function.  MODIFICATION OR ASSISTANCE TO COMPLETE EVALUATION: Min-Moderate modification of tasks or assist with assess necessary to complete an evaluation.  OT OCCUPATIONAL PROFILE AND HISTORY: Detailed assessment: Review of records and additional review of physical,  cognitive, psychosocial history related to current functional performance.  CLINICAL DECISION MAKING: Moderate - several treatment options, min-mod task modification necessary  REHAB POTENTIAL: Good  EVALUATION COMPLEXITY: Moderate    PLAN:  OT FREQUENCY: 1x a week  OT DURATION: 12 weeks  PLANNED INTERVENTIONS: 97168 OT Re-evaluation, 97535 self care/ADL training, 02889 therapeutic exercise, 97530 therapeutic activity, 97112 neuromuscular re-education, 97140 manual therapy, 97018 paraffin, 02989 moist heat, 97034 contrast bath, 97033 iontophoresis, 97129 Cognitive training (first 15 min), 02869 Cognitive training(each additional 15 min), 02239 Orthotic Initial, 97763 Orthotic/Prosthetic subsequent, energy conservation, coping strategies training, patient/family education, and DME and/or AE instructions  RECOMMENDED OTHER SERVICES: ST  CONSULTED AND AGREED WITH PLAN OF CARE: Patient  PLAN FOR NEXT SESSION: Treatment  Javionna Leder, MS, OTR/L  07/20/2024, 11:27 PM

## 2024-07-26 ENCOUNTER — Ambulatory Visit: Payer: Self-pay | Admitting: Occupational Therapy

## 2024-07-27 ENCOUNTER — Other Ambulatory Visit: Payer: Self-pay | Admitting: Gastroenterology

## 2024-07-27 DIAGNOSIS — G8929 Other chronic pain: Secondary | ICD-10-CM

## 2024-07-28 ENCOUNTER — Ambulatory Visit: Payer: Self-pay | Admitting: Occupational Therapy

## 2024-07-28 DIAGNOSIS — M6281 Muscle weakness (generalized): Secondary | ICD-10-CM

## 2024-07-28 NOTE — Therapy (Signed)
 OUTPATIENT OCCUPATIONAL THERAPY NEURO TREATMENT NOTE  Patient Name: Dakota Davis MRN: [id number] DOB:1966-07-03, 58 y.o., male Today's Date: 07/28/2024  PCP: Dakota Davis ORN, MD REFERRING PROVIDER: Onita Elspeth Sharper, MD  END OF SESSION:  OT End of Session - 07/28/24 0928     Visit Number 2    Number of Visits 12    Date for Recertification  10/12/24    OT Start Time 0845    OT Stop Time 0930    OT Time Calculation (min) 45 min    Activity Tolerance Patient tolerated treatment well    Behavior During Therapy [name] for tasks assessed/performed          Past Medical History:  Diagnosis Date   Anxiety    Anxiety    Arthritis    Depression    Hyperlipidemia    Insomnia    Lymphadenopathy, retroperitoneal    OSA (obstructive sleep apnea)    Parkinson Davis (HCC)    Sciatica    Seizures (HCC)    Spinal stenosis    Spinal stenosis    Spondylisthesis    Past Surgical History:  Procedure Laterality Date   BACK SURGERY     BACK SURGERY     10/31/2016,12/21/2015   CARDIAC CATHETERIZATION     ESOPHAGOGASTRODUODENOSCOPY N/A 11/27/2021   Procedure: ESOPHAGOGASTRODUODENOSCOPY (EGD);  Surgeon: Toledo, Ladell POUR, MD;  Location: [facility] ENDOSCOPY;  Service: Gastroenterology;  Laterality: N/A;   KNEE ARTHROSCOPY     2018   LEFT HEART CATH AND CORONARY ANGIOGRAPHY N/A 08/05/2017   Procedure: LEFT HEART CATH AND CORONARY ANGIOGRAPHY;  Surgeon: Dakota Wolm PARAS, MD;  Location: [facility] INVASIVE CV LAB;  Service: Cardiovascular;  Laterality: N/A;   TONSILECTOMY/ADENOIDECTOMY WITH MYRINGOTOMY     Patient Active Problem List   Diagnosis Date Noted   GAD (generalized anxiety disorder) 07/04/2018   PTSD (post-traumatic stress disorder) 07/04/2018   Stable angina 08/03/2017    ONSET DATE: 09/2020  REFERRING DIAG: Dakota Davis  THERAPY DIAG:  Muscle weakness (generalized)  Rationale for Evaluation and Treatment: Rehabilitation  SUBJECTIVE:   SUBJECTIVE  STATEMENT: Pt. Reports receiving an injection for [place] Davis in Dakota right  Pt accompanied by: self  PERTINENT HISTORY: Patient is a 58 year old male with a diagnosis of [place] Davis.  Patient was referred to occupational therapy for limited fine motor coordination skills and bilateral hand tremors.  Patient's hand tremors started with [place] left  hand with a gradual progression to [place] right hand.  She reports tremors are worse in Dakota left hand.  Patient has a recent history of severe right wrist and hand pain and has a upcoming orthopedic consult early next week on 07/25/2024.  Patient reports having a history of a triple cervical fusion last year.  Past medical history includes OSA, major depressive disorder (current), and prediabetes.  PRECAUTIONS: None  WEIGHT BEARING RESTRICTIONS: No  PAIN:  Are you having pain? No reports of pain  FALLS: Has patient fallen in last 6 months? No  LIVING ENVIRONMENT: Lives with: [place] Davis with wife, Dakota Davis, and youngest son Lives in: House/apartment Stairs:  two levels, 3 little steps from garage Has following equipment at home: None  PLOF: Independent  PATIENT GOALS:  To improve FMC   OBJECTIVE:  Note: Objective measures were completed at Evaluation unless otherwise noted.  HAND DOMINANCE: Right  ADLs:  Transfers/ambulation related to ADLs: Independent Eating: Increased time, difficulty at times cutting meat, Independent holding a full drink-two hands initially 2/2 onset of right hand pain Grooming:  Independent UB Dressing: Independent LB Dressing: Independent; occasionally has to redo tying shoes. 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 Leisure: Being outside, yard work, reading history research    MOBILITY STATUS: Independent  POSTURE COMMENTS:  Intact  ACTIVITY TOLERANCE: Activity tolerance: Depends on [place] task he is doing, typically approximately 30 min.  FUNCTIONAL OUTCOME MEASURES: MAM-20: 65/80  UPPER EXTREMITY ROM:  *History of left shoulder tears.  Active ROM Right Eval WFL Left Eval [facility]  Shoulder flexion    Shoulder abduction    Shoulder adduction    Shoulder extension    Shoulder internal rotation    Shoulder external rotation    Elbow flexion    Elbow extension    Wrist flexion    Wrist extension    Wrist ulnar deviation    Wrist radial deviation    Wrist pronation    Wrist supination    (Blank rows = not tested)  UPPER EXTREMITY MMT:     MMT Right eval Left eval  Shoulder flexion 5 5  Shoulder abduction 5 5  Shoulder adduction    Shoulder extension    Shoulder internal rotation    Shoulder external rotation    Middle trapezius    Lower trapezius    Elbow flexion 5 5  Elbow extension 5 5  Wrist flexion 4- 5  Wrist extension 4- 5  Wrist ulnar deviation    Wrist radial deviation    Wrist pronation 5 5  Wrist supination 5 5  (Blank rows = not tested)  HAND FUNCTION: Grip strength: Right: 94 lbs; Left: 112 lbs, Lateral pinch: Right: 38 lbs, Left: 37 lbs, and 3 point pinch: Right: 32 lbs, Left: 25 lbs  COORDINATION: 9 Hole Peg test: Right: 28 sec; Left: 37 sec  SENSATION: Light touch: WFL Proprioception: WFL  EDEMA: Intact  MUSCLE TONE: WFL  COGNITION: Overall cognitive status: Patient reports having [place] to concentrate while talking, as he loses his train of thoughts more frequently.  Patient reports concern about [place] volume of his voice has changing. Patient also reports changes in moving food through his mouth in  preparation for swallowing.  A a referral for speech therapy consult was requested  VISION: Patient reports planning to follow-up with his eye-care physician                                                                                                                      TREATMENT DATE: 07/28/2024  Therapeutic Activities:   -Theraputty bilateral hand strengthening exercises including: gross gripping, gross digit extension, lateral, and 3pt. pinch strengthening, thumb opposition, bilateral alternating twisting motion in opposite directions with supination/pronation, rolling circular spheres within  [place] tips of Dakota fingers, and manipulating putty within Dakota tips of fingers to remove small objects. Pt. Was provided with a visual handout HEP through [employer] with a video access code.   -Facilitated bilateral lateral, and 3pt. Pinch strengthening using yellow, red, green, and blue, and black level resistive clips in combination with translatory movements moving clips from Dakota lateral pinch position to Dakota 3pt. Pinch position in preparation for securely placing them on Dakota dowel to promote hand function skills. -Facilitated [facility]  skills grasping 1 sticks from a shallow dish on [place] Purdue pegboard. Pt. worked on grasping [place] 1 sticks with Dakota 2nd digit and thumb, and storing them within Dakota palm. Pt. worked on translatory movements moving [place] pegs from Dakota palm to Dakota tip of Dakota 2nd digit and thumb in preparation for discarding them. Pt. worked on performing bilateral alternating hand movements. -Pt. was further challenged by adding a speed component to Dakota task.   PATIENT EDUCATION:  Education details:  hand strengthening with blue theraputty Person educated: Patient Education method: Explanation, Demonstration, Tactile cues, and Verbal cues Education comprehension: verbalized understanding, returned demonstration, verbal cues required, tactile cues required, and needs further education  HOME  EXERCISE PROGRAM:  -Hand strengthening with blue theraputty -Condition management: compensatory strategies for tremors stabilizing proximally for forearm support for distal control. -bilateral [facility] skills.   GOALS: Goals reviewed with patient? Yes  SHORT TERM GOALS: Target date: 08/31/2024   Patient will be independent with HEPs for BUE functioning. Baseline: Eval: Current home exercise program Goal status: INITIAL  LONG TERM GOALS: Target date: 10/12/2024  Patient will independently and efficiently be able to cut meat. Baseline: Eval: Patient presents with difficulty cutting meat efficiently Goal status: INITIAL  2.  Patient will independently grasp, and manipulate multiple medication pills efficiently with his right hand Baseline: Eval: Pt. Has difficulty manipulating to pills at a time efficiently in his hand. Goal status: INITIAL  3.  Patient will independently and efficiently be able to clip nails Baseline: Eval: Patient scored 2-3/4 for cutting nails with a nail clipper portion on [place] Mam-20 Goal status: INITIAL  4.  Patient will independently and efficiently food on his plate Baseline: Eval: Patient has difficulty cutting meat. Goal status: INITIAL  5.  Patient will grasp 1 item efficiently out of a full pocket Baseline: Eval: Patient has difficulty grasping 1 item efficiently from multiple items in his pocket Goal status: INITIAL  6.  Patient will efficiently write a 3 sentence paragraph with 75% legibility and minimal micrographia  Baseline: Eval: Name only 50% legibility with moderate micrographia Goal status: INITIAL  ASSESSMENT:  CLINICAL IMPRESSION:  Pt. reports noticing a difference in [place] tolerance for performing pinch grasp with Dakota right hand since receiving injections last week. Pt. required cues for visual demonstration for proper ther. ex. technique, and FMC movement patterns. Pt. presents with difficulty performing translatory movements with [place] right  hand. Patient continues to benefit from OT services to work on improving hand function skills in order to maximize overall independence with ADLs and IADL tasks  PERFORMANCE DEFICITS: in functional skills including ADLs, IADLs, coordination, dexterity, ROM, strength, pain, Fine motor control, and Gross motor control, cognitive skills including attention and memory, and psychosocial skills including coping strategies, environmental adaptation, interpersonal interactions, and routines and behaviors.   IMPAIRMENTS: are limiting patient from ADLs, IADLs, and leisure.   Davis-MORBIDITIES: may have Davis-morbidities  that affects occupational performance. Patient will benefit from skilled OT to address above impairments and  improve overall function.  MODIFICATION OR ASSISTANCE TO COMPLETE EVALUATION: Min-Moderate modification of tasks or assist with assess necessary to complete an evaluation.  OT OCCUPATIONAL PROFILE AND HISTORY: Detailed assessment: Review of records and additional review of physical, cognitive, psychosocial history related to current functional performance.  CLINICAL DECISION MAKING: Moderate - several treatment options, min-mod task modification necessary  REHAB POTENTIAL: Good  EVALUATION COMPLEXITY: Moderate    PLAN:  OT FREQUENCY: 1x a week  OT DURATION: 12 weeks  PLANNED INTERVENTIONS: 97168 OT Re-evaluation, 97535 self care/ADL training, 02889 therapeutic exercise, 97530 therapeutic activity, 97112 neuromuscular re-education, 97140 manual therapy, 97018 paraffin, 02989 moist heat, 97034 contrast bath, 97033 iontophoresis, 97129 Cognitive training (first 15 min), 02869 Cognitive training(each additional 15 min), 02239 Orthotic Initial, 97763 Orthotic/Prosthetic subsequent, energy conservation, coping strategies training, patient/family education, and DME and/or AE instructions  RECOMMENDED OTHER SERVICES: ST  CONSULTED AND AGREED WITH PLAN OF CARE: Patient  PLAN FOR NEXT  SESSION: Treatment  Richardson Otter, MS, OTR/L  07/28/2024, 9:31 AM

## 2024-07-29 ENCOUNTER — Ambulatory Visit
Admission: RE | Admit: 2024-07-29 | Discharge: 2024-07-29 | Disposition: A | Discharge status: Home / Self Care | Source: Ambulatory Visit | Attending: Gastroenterology | Admitting: Gastroenterology

## 2024-07-29 DIAGNOSIS — G8929 Other chronic pain: Secondary | ICD-10-CM | POA: Diagnosis present

## 2024-07-29 DIAGNOSIS — R1011 Right upper quadrant pain: Secondary | ICD-10-CM | POA: Insufficient documentation

## 2024-08-02 ENCOUNTER — Ambulatory Visit: Payer: Self-pay | Admitting: Occupational Therapy

## 2024-08-03 ENCOUNTER — Ambulatory Visit

## 2024-08-04 ENCOUNTER — Ambulatory Visit: Payer: Self-pay | Admitting: Occupational Therapy

## 2024-08-04 ENCOUNTER — Ambulatory Visit: Admitting: Speech Pathology

## 2024-08-04 DIAGNOSIS — R278 Other lack of coordination: Secondary | ICD-10-CM

## 2024-08-04 DIAGNOSIS — M6281 Muscle weakness (generalized): Secondary | ICD-10-CM

## 2024-08-04 DIAGNOSIS — R471 Dysarthria and anarthria: Secondary | ICD-10-CM

## 2024-08-04 DIAGNOSIS — R1312 Dysphagia, oropharyngeal phase: Secondary | ICD-10-CM

## 2024-08-04 NOTE — Therapy (Signed)
 OUTPATIENT OCCUPATIONAL THERAPY NEURO TREATMENT NOTE  Patient Name: Dakota Davis MRN: [id number] DOB:1966/07/19, 58 y.o., male Today's Date: 08/04/2024  PCP: Lenon Layman ORN, MD REFERRING PROVIDER: Onita Elspeth Sharper, MD  END OF SESSION:  OT End of Session - 08/04/24 1508     Visit Number 3    Number of Visits 12    Date for Recertification  10/12/24    OT Start Time 1450    OT Stop Time 1530    OT Time Calculation (min) 40 min    Activity Tolerance Patient tolerated treatment well    Behavior During Therapy [facility] for tasks assessed/performed          Past Medical History:  Diagnosis Date   Anxiety    Anxiety    Arthritis    Depression    Hyperlipidemia    Insomnia    Lymphadenopathy, retroperitoneal    OSA (obstructive sleep apnea)    Parkinson disease (HCC)    Sciatica    Seizures (HCC)    Spinal stenosis    Spinal stenosis    Spondylisthesis    Past Surgical History:  Procedure Laterality Date   BACK SURGERY     BACK SURGERY     10/31/2016,12/21/2015   CARDIAC CATHETERIZATION     ESOPHAGOGASTRODUODENOSCOPY N/A 11/27/2021   Procedure: ESOPHAGOGASTRODUODENOSCOPY (EGD);  Surgeon: Toledo, Ladell POUR, MD;  Location: [facility] ENDOSCOPY;  Service: Gastroenterology;  Laterality: N/A;   KNEE ARTHROSCOPY     2018   LEFT HEART CATH AND CORONARY ANGIOGRAPHY N/A 08/05/2017   Procedure: LEFT HEART CATH AND CORONARY ANGIOGRAPHY;  Surgeon: Hester Wolm PARAS, MD;  Location: [facility] INVASIVE CV LAB;  Service: Cardiovascular;  Laterality: N/A;   TONSILECTOMY/ADENOIDECTOMY WITH MYRINGOTOMY     Patient Active Problem List   Diagnosis Date Noted   GAD (generalized anxiety disorder) 07/04/2018   PTSD (post-traumatic stress disorder) 07/04/2018   Stable angina 08/03/2017    ONSET DATE: 09/2020  REFERRING DIAG: Parkinson's disease  THERAPY DIAG:  Muscle weakness (generalized)  Other lack of coordination  Rationale for Evaluation and Treatment:  Rehabilitation  SUBJECTIVE:   SUBJECTIVE STATEMENT: Pt. Reports that he still has discomfort  in the right  hand Pt accompanied by: self  PERTINENT HISTORY: Patient is a 58 year old male with a diagnosis of Parkinson's disease.  Patient was referred to occupational therapy for limited fine motor coordination skills and bilateral hand tremors.  Patient's hand tremors started with the left  hand with a gradual progression to the right hand.  [name].  Patient has a recent history of severe right wrist and hand pain and has a upcoming orthopedic consult early next week on 07/25/2024.  Patient reports having a history of a triple cervical fusion last year.  Past medical history includes OSA, major depressive disorder (current), and prediabetes.  PRECAUTIONS: None  WEIGHT BEARING RESTRICTIONS: No  PAIN:  Are you having pain?  2/10 right thumb  FALLS: Has patient fallen in last 6 months? No  LIVING ENVIRONMENT: Lives with: [name] with wife, [name], and youngest son Lives in: House/apartment Stairs:  two levels, 3 little steps from garage Has following equipment at home: None  PLOF: Independent  PATIENT GOALS:  To improve FMC   OBJECTIVE:  Note: Objective measures were completed at Evaluation unless otherwise noted.  HAND DOMINANCE: Right  ADLs:  Transfers/ambulation related to ADLs: Independent Eating: Increased time, difficulty at times cutting meat, Independent holding a full drink-two hands initially  2/2 onset of right hand pain Grooming: Independent UB Dressing: Independent LB Dressing: Independent; occasionally has to redo tying shoes. 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 Leisure: Being outside, yard work, reading history research    MOBILITY STATUS: Independent  POSTURE COMMENTS:  Intact  ACTIVITY TOLERANCE: Activity tolerance: Depends on the task he is doing, typically approximately 30 min.  FUNCTIONAL OUTCOME MEASURES: MAM-20: 65/80  UPPER EXTREMITY ROM:  *History of left shoulder tears.  Active ROM Right Eval WFL Left Eval [facility]  Shoulder flexion    Shoulder abduction    Shoulder adduction    Shoulder extension    Shoulder internal rotation    Shoulder external rotation    Elbow flexion    Elbow extension    Wrist flexion    Wrist extension    Wrist ulnar deviation    Wrist radial deviation    Wrist pronation    Wrist supination    (Blank rows = not tested)  UPPER EXTREMITY MMT:     MMT Right eval Left eval  Shoulder flexion 5 5  Shoulder abduction 5 5  Shoulder adduction    Shoulder extension    Shoulder internal rotation    Shoulder external rotation    Middle trapezius    Lower trapezius    Elbow flexion 5 5  Elbow extension 5 5  Wrist flexion 4- 5  Wrist extension 4- 5  Wrist ulnar deviation    Wrist radial deviation    Wrist pronation 5 5  Wrist supination 5 5  (Blank rows = not tested)  HAND FUNCTION: Grip strength: Right: 94 lbs; Left: 112 lbs, Lateral pinch: Right: 38 lbs, Left: 37 lbs, and 3 point pinch: Right: 32 lbs, Left: 25 lbs  COORDINATION: 9 Hole Peg test: Right: 28 sec; Left: 37 sec  SENSATION: Light touch: WFL Proprioception: WFL  EDEMA: Intact  MUSCLE TONE: WFL  COGNITION: Overall cognitive status: Patient reports having [name] to concentrate while talking, as he loses his train of thoughts more frequently.  Patient reports concern about the the volume of his voice has changing. Patient also reports  changes in moving food through his mouth in preparation for swallowing.  A a referral for speech therapy consult was requested  VISION: Patient reports planning to follow-up with his eye-care physician                                                                                                                      TREATMENT DATE: 08/04/2024  Self-care:  -Patient worked on that ADL of cutting food using a table knife and fork cutting through resistive putty at the tabletop surface. -Condition management: Reviewed patient  functional status, and changes in medical condition.   Therapeutic Activities:   -Facilitated [facility] skills using the Jamar Tweezer Dexterity Task using resistive tweezers to pick up 1 thin sticks from a horizontal position, and sustaining the grasp while preparing to place them into a vertical position with the pegboard placed at a flat tabletop surface. -The task was graded to increase complexity of movement patterns with emphasis placed on sustaining grasp on the tweezers while extending the wrist to place the 1 pegs into the board placed at a vertical incline/angle.    PATIENT EDUCATION:  Education details:  hand strengthening with blue theraputty Person educated: Patient Education method: Explanation, Demonstration, Tactile cues, and Verbal cues Education comprehension: verbalized understanding, returned demonstration, verbal cues required, tactile cues required, and needs further education  HOME EXERCISE PROGRAM:  -Hand strengthening with blue theraputty -Condition management: compensatory strategies for tremors stabilizing proximally for forearm support for distal control. -bilateral [facility] skills.   GOALS: Goals reviewed with patient? Yes  SHORT TERM GOALS: Target date: 08/31/2024   Patient will be independent with HEPs for BUE functioning. Baseline: Eval: Current home exercise program Goal status: INITIAL  LONG TERM GOALS: Target date:  10/12/2024  Patient will independently and efficiently be able to cut meat. Baseline: Eval: Patient presents with difficulty cutting meat efficiently Goal status: INITIAL  2.  Patient will independently grasp, and manipulate multiple medication pills efficiently with his right hand Baseline: Eval: Pt. Has difficulty manipulating to pills at a time efficiently in his hand. Goal status: INITIAL  3.  Patient will independently and efficiently be able to clip nails Baseline: Eval: Patient scored 2-3/4 for cutting nails with a nail clipper portion on the Mam-20 Goal status: INITIAL  4.  Patient will independently and efficiently food on his plate Baseline: Eval: Patient has difficulty cutting meat. Goal status: INITIAL  5.  Patient will grasp 1 item efficiently out of a full pocket Baseline: Eval: Patient has difficulty grasping 1 item efficiently from multiple items in his pocket Goal status: INITIAL  6.  Patient will efficiently write a 3 sentence paragraph with 75% legibility and minimal micrographia  Baseline: Eval: Name only 50% legibility with moderate micrographia Goal status: INITIAL  ASSESSMENT:  CLINICAL IMPRESSION:  Patient reports having had a stat appointment with Dr. Maree on Monday after having had a gastroenterology appointment.  Patient will be receiving further workup over the next month secondary to changes in his functional and medical condition.  Patient was able to efficiently cut through pink level resistive putty, while handling utensils within his hands. Patient required increased time to use the tweezers to grasp and place a 1 inch sticks onto the Jamar board. Patient dropped multiple pegs when the task was graded to increase difficulty with the board being placed at a vertical angle. Patient continues to benefit from OT services to work on improving hand function skills in order to maximize overall independence with ADLs and IADL tasks  PERFORMANCE DEFICITS: in  functional skills including ADLs, IADLs, coordination, dexterity, ROM, strength, pain, Fine motor control, and Gross motor control, cognitive skills including attention and memory, and psychosocial skills including coping strategies, environmental adaptation, interpersonal interactions, and routines and behaviors.   IMPAIRMENTS: are limiting patient from ADLs, IADLs, and leisure.   CO-MORBIDITIES: may have co-morbidities  that affects occupational performance. Patient will benefit from skilled OT to address above impairments and improve overall function.  MODIFICATION OR ASSISTANCE TO COMPLETE EVALUATION: Min-Moderate modification of tasks or assist with assess  necessary to complete an evaluation.  OT OCCUPATIONAL PROFILE AND HISTORY: Detailed assessment: Review of records and additional review of physical, cognitive, psychosocial history related to current functional performance.  CLINICAL DECISION MAKING: Moderate - several treatment options, min-mod task modification necessary  REHAB POTENTIAL: Good  EVALUATION COMPLEXITY: Moderate    PLAN:  OT FREQUENCY: 1x a week  OT DURATION: 12 weeks  PLANNED INTERVENTIONS: 97168 OT Re-evaluation, 97535 self care/ADL training, 02889 therapeutic exercise, 97530 therapeutic activity, 97112 neuromuscular re-education, 97140 manual therapy, 97018 paraffin, 02989 moist heat, 97034 contrast bath, 97033 iontophoresis, 97129 Cognitive training (first 15 min), 02869 Cognitive training(each additional 15 min), 02239 Orthotic Initial, 97763 Orthotic/Prosthetic subsequent, energy conservation, coping strategies training, patient/family education, and DME and/or AE instructions  RECOMMENDED OTHER SERVICES: ST  CONSULTED AND AGREED WITH PLAN OF CARE: Patient  PLAN FOR NEXT SESSION: Treatment  Richardson Otter, MS, OTR/L  08/04/2024, 5:46 PM

## 2024-08-08 ENCOUNTER — Other Ambulatory Visit: Payer: Self-pay | Admitting: Gastroenterology

## 2024-08-08 DIAGNOSIS — G8929 Other chronic pain: Secondary | ICD-10-CM

## 2024-08-08 NOTE — Therapy (Signed)
 OUTPATIENT SPEECH LANGUAGE PATHOLOGY PARKINSON'S EVALUATION   Patient Name: Dakota Davis MRN: [id number] DOB:28-Aug-1966, 58 y.o., male Today's Date: 08/04/2024  PCP: Layman Piety, MD REFERRING PROVIDER: Layman Piety, MD   End of Session - 08/04/24 1600     Visit Number 1    Number of Visits 25    Date for Recertification  10/27/24    Authorization Type [employer]    Progress Note Due on Visit 10    SLP Start Time 1405    SLP Stop Time  1445    SLP Time Calculation (min) 40 min    Activity Tolerance Patient tolerated treatment well          Past Medical History:  Diagnosis Date   Anxiety    Anxiety    Arthritis    Depression    Hyperlipidemia    Insomnia    Lymphadenopathy, retroperitoneal    OSA (obstructive sleep apnea)    Parkinson disease (HCC)    Sciatica    Seizures (HCC)    Spinal stenosis    Spinal stenosis    Spondylisthesis    Past Surgical History:  Procedure Laterality Date   BACK SURGERY     BACK SURGERY     10/31/2016,12/21/2015   CARDIAC CATHETERIZATION     ESOPHAGOGASTRODUODENOSCOPY N/A 11/27/2021   Procedure: ESOPHAGOGASTRODUODENOSCOPY (EGD);  Surgeon: Toledo, Ladell POUR, MD;  Location: [facility] ENDOSCOPY;  Service: Gastroenterology;  Laterality: N/A;   KNEE ARTHROSCOPY     2018   LEFT HEART CATH AND CORONARY ANGIOGRAPHY N/A 08/05/2017   Procedure: LEFT HEART CATH AND CORONARY ANGIOGRAPHY;  Surgeon: Hester Wolm PARAS, MD;  Location: [facility] INVASIVE CV LAB;  Service: Cardiovascular;  Laterality: N/A;   TONSILECTOMY/ADENOIDECTOMY WITH MYRINGOTOMY     Patient Active Problem List   Diagnosis Date Noted   GAD (generalized anxiety disorder) 07/04/2018   PTSD (post-traumatic stress disorder) 07/04/2018   Stable angina 08/03/2017    ONSET DATE: Parkinson's dx'ed 2022, date of referral 08/01/2024  REFERRING DIAG: G20.A1 (ICD-10-CM) - Parkinson's disease (HCC)   THERAPY DIAG:  Dysarthria and anarthria  Dysphagia, oropharyngeal  phase  Rationale for Evaluation and Treatment Rehabilitation  SUBJECTIVE:   SUBJECTIVE STATEMENT: Pt pleasant, good historian, know to this writer from previous Outpatient ST  Pt accompanied by: self  PERTINENT HISTORY: Pt is a 58 year old male with Parkinson's Disease, progressive medication wearing off time, esophageal dysphagia (most recent dilation 08/03/2024 - pt states some improvement noticed), decreased appetite with recent (08/01/2024) worsening symptoms of Parkinson's disease, including increased tremors, exceptional fatigue in his voice, significant swallowing difficulties with eating described as 'fatiguing' due to effort required to chew and move food in mouth, frequent choking episodes, poor nutrition due to fatigue associated with eating, weight loss despite attempts to increase calorie intake, Progressive dysphagia, jaw weakness, and fatigue are more severe than typical for Parkinson's disease at this stage. Differential diagnosis includes neuromuscular disorders such as myasthenia gravis and ALS.  DIAGNOSTIC FINDINGS:   EMG at [facility] scheduled for 08/31/2024  PAIN:  Are you having pain? No  FALLS: Has patient fallen in last 6 months?  No  LIVING ENVIRONMENT: Lives with: lives with their family Lives in: House/apartment  PLOF:  Level of assistance: Independent with ADLs, Independent with IADLs Employment: Full-time employment  PATIENT GOALS    to improve hypophonia and reduce choking when consuming  PO items  OBJECTIVE:  COGNITIVE COMMUNICATION Overall cognitive status: Within functional limits for tasks assessed Auditory comprehension: [facility]  Verbal expression: WFL;Functional communication: Impaired:  pt with significant hypophonia (c/b as a whisper)   MOTOR SPEECH: Overall motor speech: impaired Level of impairment: Word Respiration: clavicular breathing and speaking on residual capacity Phonation: aphonic, breathy, and low vocal intensity Resonance:  WFL Articulation: Appears intact Intelligibility: Intelligibility reduced Effective technique: increased vocal intensity  ORAL MOTOR EXAMINATION Facial : WFL - fistulations Lingual: Comment: fistulations  Velum: WFL - subjective weakness noted but no differences in resonance observed during today's evaluation Mandible: Comment: fistulations Cough: Non-Productive  OBJECTIVE VOICE ASSESSMENT: Voice Analysis App unable to provide pitch or loudness during ah and conversation d/t reduced phonation and vocal intensity Oral reading (passage) loudness average: 70 dB Oral reading (passage) pitch average:  107 Hz Voice quality: hoarse, breathy, strained, low vocal intensity, and vocal fatigue Comments: Pt ;largely unable to increase vocal intensity d/t immense physical effort required  CLINICAL SWALLOW ASSESSMENT:   Current diet: regular and thin liquids Dentition: adequate natural dentition Feeding: able to feed self Consistencies tested: Thin Liquid: Presentation: Cup and Self-fed Oral Phase: WFL Pharyngeal Phase: WFL Puree: Presentation: Spoon and By hand Oral Phase: Impaired: reduced lingual movement/coordination, impaired mastication, prolonged mastication, and prolonged bolus formation Pharyngeal Phase: Impaired: multiple swallows, complaints of residue, and complaints of globus Regular: Presentation: By hand Oral Phase: Impaired: reduced lingual movement/coordination, impaired mastication, prolonged mastication, and prolonged bolus formation Pharyngeal Phase: Impaired: multiple swallows, complaints of residue, and complaints of globus   Evaluation findings: Patient presents with s/sx of (oral, pharyngeal, oropharyngeal) dysphagia characterized by severe oral phase and mild pharyngeal phase dysphagia. .   Aspiration risk factors:History of dysphagia, History of GERD, History of esophageal-related issues, Neurological disease, and Deconditioning Overall aspiration risk:Moderate and  Risk for inadequate nutrition/hydration Diet Recommendations: Dysphagia 3 (mechanical soft), Dysphagia 2 (chopped/minced), and thin liquids Precautions:Minimize environmental distractions, Slow rate, Small sips/bites, Seated upright 90 degrees, and Remain upright for at least 30 minutes after meals Supervision: Patient able to feed self Oral care recommendations:Oral care BID Follow-up recommendations: MBSS, Therapy as outlined in treatment plan below, and Defer until completion of instrumental exam  PATIENT REPORTED OUTCOME MEASURES (PROM): To be administered over the next 3 sessions  TODAY'S TREATMENT:  Education provided on soft solid food items that are high in protein such as tuna, salmon and chicken packets, yogurt   PATIENT EDUCATION: Education details: results of this assessment, ST POC including potential voice banking and instrumental swallow study Person educated: Patient Education method: Explanation Education comprehension: verbalized understanding and needs further education   HOME EXERCISE PROGRAM: Adhere to strict aspiration precautions   GOALS: Goals reviewed with patient? Yes  SHORT TERM GOALS: Target date: 10 sessions  *** Baseline: Goal status: {GOALSTATUS:25110}  2.  *** Baseline:  Goal status: {GOALSTATUS:25110}  3.  *** Baseline:  Goal status: {GOALSTATUS:25110}  4.  *** Baseline:  Goal status: {GOALSTATUS:25110}  5.  *** Baseline:  Goal status: {GOALSTATUS:25110}  6.  *** Baseline:  Goal status: {GOALSTATUS:25110}  LONG TERM GOALS: Target date: 10/27/2024  *** Baseline:  Goal status: {GOALSTATUS:25110}  2.  *** Baseline:  Goal status: {GOALSTATUS:25110}  3.  *** Baseline:  Goal status: {GOALSTATUS:25110}  4.  *** Baseline:  Goal status: {GOALSTATUS:25110}  5.  *** Baseline:  Goal status: {GOALSTATUS:25110}  6.  *** Baseline:  Goal status: {GOALSTATUS:25110}  ASSESSMENT:  CLINICAL IMPRESSION: Patient is a 58  y.o. male who was seen today for a voice and clinical swallow evaluation d/t recent decline in speech and swallowing. Pt with history  of Parkinson's Disease but now presenting with symptoms   When consuming applesauce and then while consuming graham crackers, while lingual movement and manipulation cannot fully be assessed wihtout x-ray, pt appears with pt with the appearance of reduced amplitude of mastication and no rotary chew > 1 minute for 1 bolus of graham cracker Pt with liitle lingual manipulation fistulation observed in messeter muscle  More pornnounced forward flexion of head,  OBJECTIVE IMPAIRMENTS include dysarthria, voice disorder, and dysphagia. These impairments are limiting patient from effectively communicating at home and in community and safety when swallowing. Factors affecting potential to achieve goals and functional outcome are medical prognosis, previous level of function, and severity of impairments. Patient will benefit from skilled SLP services to address above impairments and improve overall function.  REHAB POTENTIAL: Good  PLAN: SLP FREQUENCY: 1-2x/week  SLP DURATION: 12 weeks  PLANNED INTERVENTIONS: Aspiration precaution training, Diet toleration management , Trials of upgraded texture/liquids, Functional tasks, Multimodal communication approach, SLP instruction and feedback, Compensatory strategies, and Patient/family education    Richey Doolittle B. Rubbie, M.S., CCC-SLP, Tree Surgeon Certified Brain Injury Specialist [facility]  [facility] Rehabilitation Services Office [phone] Ascom [phone] Fax [phone]

## 2024-08-09 ENCOUNTER — Ambulatory Visit: Payer: Self-pay | Admitting: Occupational Therapy

## 2024-08-09 ENCOUNTER — Ambulatory Visit: Admitting: Speech Pathology

## 2024-08-09 DIAGNOSIS — R471 Dysarthria and anarthria: Secondary | ICD-10-CM

## 2024-08-09 DIAGNOSIS — R1312 Dysphagia, oropharyngeal phase: Secondary | ICD-10-CM

## 2024-08-09 DIAGNOSIS — M6281 Muscle weakness (generalized): Secondary | ICD-10-CM | POA: Diagnosis not present

## 2024-08-09 NOTE — Therapy (Signed)
 OUTPATIENT OCCUPATIONAL THERAPY NEURO TREATMENT NOTE  Patient Name: Dakota Davis MRN: [id number] DOB:18-Feb-1966, 58 y.o., male Today's Date: 08/09/2024  PCP: Dakota Davis ORN, MD REFERRING PROVIDER: Onita Elspeth Sharper, MD  END OF SESSION:  OT End of Session - 08/09/24 1729     Visit Number 4    Number of Visits 12    Date for Recertification  10/12/24    OT Start Time 1615    OT Stop Time 1653    OT Time Calculation (min) 38 min    Activity Tolerance Patient tolerated treatment well    Behavior During Therapy [facility] for tasks assessed/performed          Past Medical History:  Diagnosis Date   Anxiety    Anxiety    Arthritis    Depression    Hyperlipidemia    Insomnia    Lymphadenopathy, retroperitoneal    OSA (obstructive sleep apnea)    Parkinson disease (HCC)    Sciatica    Seizures (HCC)    Spinal stenosis    Spinal stenosis    Spondylisthesis    Past Surgical History:  Procedure Laterality Date   BACK SURGERY     BACK SURGERY     10/31/2016,12/21/2015   CARDIAC CATHETERIZATION     ESOPHAGOGASTRODUODENOSCOPY N/A 11/27/2021   Procedure: ESOPHAGOGASTRODUODENOSCOPY (EGD);  Surgeon: Toledo, Ladell POUR, MD;  Location: [facility] ENDOSCOPY;  Service: Gastroenterology;  Laterality: N/A;   KNEE ARTHROSCOPY     2018   LEFT HEART CATH AND CORONARY ANGIOGRAPHY N/A 08/05/2017   Procedure: LEFT HEART CATH AND CORONARY ANGIOGRAPHY;  Surgeon: Dakota Wolm PARAS, MD;  Location: [facility] INVASIVE CV LAB;  Service: Cardiovascular;  Laterality: N/A;   TONSILECTOMY/ADENOIDECTOMY WITH MYRINGOTOMY     Patient Active Problem List   Diagnosis Date Noted   GAD (generalized anxiety disorder) 07/04/2018   PTSD (post-traumatic stress disorder) 07/04/2018   Stable angina 08/03/2017    ONSET DATE: 09/2020  REFERRING DIAG: Parkinson's disease  THERAPY DIAG:  Muscle weakness (generalized)  Rationale for Evaluation and Treatment: Rehabilitation  SUBJECTIVE:   SUBJECTIVE  STATEMENT:   Pt. reports that his sister is coming in from [place]  for Thanksgiving. Pt accompanied by: self  PERTINENT HISTORY: Patient is a 58 year old male with a diagnosis of Parkinson's disease.  Patient was referred to occupational therapy for limited fine motor coordination skills and bilateral hand tremors.  Patient's hand tremors started with the left  hand with a gradual progression to the right hand.  [name].  Patient has a recent history of severe right wrist and hand pain and has a upcoming orthopedic consult early next week on 07/25/2024.  Patient reports having a history of a triple cervical fusion last year.  Past medical history includes OSA, major depressive disorder (current), and prediabetes.  PRECAUTIONS: None  WEIGHT BEARING RESTRICTIONS: No  PAIN:  Are you having pain?   No reports of pain  FALLS: Has patient fallen in last 6 months? No  LIVING ENVIRONMENT: Lives with: Dakota Davis with wife, Dakota Davis, and youngest son Lives in: House/apartment Stairs:  two levels, 3 little steps from garage Has following equipment at home: None  PLOF: Independent  PATIENT GOALS:  To improve FMC   OBJECTIVE:  Note: Objective measures were completed at Evaluation unless otherwise noted.  HAND DOMINANCE: Right  ADLs:  Transfers/ambulation related to ADLs: Independent Eating: Increased time, difficulty at times cutting meat, Independent holding a full drink-two hands initially 2/2 onset  of right hand pain Grooming: Independent UB Dressing: Independent LB Dressing: Independent; occasionally has to redo tying shoes. 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 Leisure: Being outside, yard work, reading history research    MOBILITY STATUS: Independent  POSTURE COMMENTS:  Intact  ACTIVITY TOLERANCE: Activity tolerance: Depends on the task he is doing, typically approximately 30 min.  FUNCTIONAL OUTCOME MEASURES: MAM-20: 65/80  UPPER EXTREMITY ROM:  *History of left shoulder tears.  Active ROM Right Eval WFL Left Eval [facility]  Shoulder flexion    Shoulder abduction    Shoulder adduction    Shoulder extension    Shoulder internal rotation    Shoulder external rotation    Elbow flexion    Elbow extension    Wrist flexion    Wrist extension    Wrist ulnar deviation    Wrist radial deviation    Wrist pronation    Wrist supination    (Blank rows = not tested)  UPPER EXTREMITY MMT:     MMT Right eval Left eval  Shoulder flexion 5 5  Shoulder abduction 5 5  Shoulder adduction    Shoulder extension    Shoulder internal rotation    Shoulder external rotation    Middle trapezius    Lower trapezius    Elbow flexion 5 5  Elbow extension 5 5  Wrist flexion 4- 5  Wrist extension 4- 5  Wrist ulnar deviation    Wrist radial deviation    Wrist pronation 5 5  Wrist supination 5 5  (Blank rows = not tested)  HAND FUNCTION: Grip strength: Right: 94 lbs; Left: 112 lbs, Lateral pinch: Right: 38 lbs, Left: 37 lbs, and 3 point pinch: Right: 32 lbs, Left: 25 lbs  COORDINATION: 9 Hole Peg test: Right: 28 sec; Left: 37 sec  SENSATION: Light touch: WFL Proprioception: WFL  EDEMA: Intact  MUSCLE TONE: WFL  COGNITION: Overall cognitive status: Patient reports having [name] to concentrate while talking, as he loses his train of thoughts more frequently.  Patient reports concern about the the volume of his voice has changing. Patient also reports changes in moving food through his mouth  in preparation for swallowing.  A a referral for speech therapy consult was requested  VISION: Patient reports planning to follow-up with his eye-care physician                                                                                                                      TREATMENT DATE: 08/09/2024  Therapeutic Activities:   -Facilitated motor control, and [facility] skills manipulating a Klask 1/2  thick control magnet with vision occluded, while guiding a 1/4 magnet with it through mazes  of progressively increasing degrees of complexity.  -Facilitated Lateral, and 3pt. pinch strengthening using yellow, red, green, blue, and black level resistive clips  -Facilitated translatory movements moving clips from the lateral pinch position to the 3pt. Pinch position in preparation for securely placing them on the dowel to promote hand function skills.   PATIENT EDUCATION:  Education details:  hand strengthening with blue theraputty Person educated: Patient Education method: Explanation, Demonstration, Tactile cues, and Verbal cues Education comprehension: verbalized understanding, returned demonstration, verbal cues required, tactile cues required, and needs further education  HOME EXERCISE PROGRAM:  -Hand strengthening with blue theraputty -Condition management: compensatory strategies for tremors stabilizing proximally for forearm support for distal control. -bilateral [facility] skills.   GOALS: Goals reviewed with patient? Yes  SHORT TERM GOALS: Target date: 08/31/2024   Patient will be independent with HEPs for BUE functioning. Baseline: Eval: Current home exercise program Goal status: INITIAL  LONG TERM GOALS: Target date: 10/12/2024  Patient will independently and efficiently be able to cut meat. Baseline: Eval: Patient presents with difficulty cutting meat efficiently Goal status: INITIAL  2.  Patient will independently grasp, and manipulate multiple medication pills efficiently  with his right hand Baseline: Eval: Pt. Has difficulty manipulating to pills at a time efficiently in his hand. Goal status: INITIAL  3.  Patient will independently and efficiently be able to clip nails Baseline: Eval: Patient scored 2-3/4 for cutting nails with a nail clipper portion on the Mam-20 Goal status: INITIAL  4.  Patient will independently and efficiently food on his plate Baseline: Eval: Patient has difficulty cutting meat. Goal status: INITIAL  5.  Patient will grasp 1 item efficiently out of a full pocket Baseline: Eval: Patient has difficulty grasping 1 item efficiently from multiple items in his pocket Goal status: INITIAL  6.  Patient will efficiently write a 3 sentence paragraph with 75% legibility and minimal micrographia  Baseline: Eval: Name only 50% legibility with moderate micrographia Goal status: INITIAL  ASSESSMENT:  CLINICAL IMPRESSION:  Pt. required cues initially for hand, and pinch position on the resistive clips. Pt. was able to efficiently complete the Klask task with the right hand. Pt. Required increased time to complete both the simple, and complex mazes during the [place] task. Patient continues to benefit from OT services to work on improving hand function skills in order to maximize overall independence with ADLs and IADL tasks  PERFORMANCE DEFICITS: in functional skills including ADLs, IADLs, coordination, dexterity, ROM, strength, pain, Fine motor control, and Gross motor control, cognitive skills including attention and memory, and psychosocial skills including coping strategies, environmental adaptation, interpersonal interactions, and routines and behaviors.   IMPAIRMENTS: are limiting patient from ADLs, IADLs, and leisure.   Davis-MORBIDITIES: may have Davis-morbidities  that affects occupational performance. Patient will benefit from skilled OT to address above impairments and improve overall function.  MODIFICATION OR ASSISTANCE TO COMPLETE  EVALUATION: Min-Moderate modification of tasks or assist with assess necessary to complete an evaluation.  OT OCCUPATIONAL PROFILE AND HISTORY: Detailed assessment: Review of records and additional review of physical, cognitive, psychosocial history related to current functional performance.  CLINICAL DECISION MAKING: Moderate - several treatment options, min-mod task modification necessary  REHAB POTENTIAL: Good  EVALUATION COMPLEXITY: Moderate    PLAN:  OT FREQUENCY: 1x a week  OT DURATION: 12 weeks  PLANNED INTERVENTIONS: 97168 OT Re-evaluation, 97535 self care/ADL training, 02889 therapeutic exercise, 97530 therapeutic activity, 97112 neuromuscular re-education, 97140 manual therapy, 97018 paraffin, 02989 moist heat, 97034 contrast bath, [id number]  iontophoresis, [id number] Cognitive training (first 15 min), 02869 Cognitive training(each additional 15 min), 02239 Orthotic Initial, 97763 Orthotic/Prosthetic subsequent, energy conservation, coping strategies training, patient/family education, and DME and/or AE instructions  RECOMMENDED OTHER SERVICES: ST  CONSULTED AND AGREED WITH PLAN OF CARE: Patient  PLAN FOR NEXT SESSION: Treatment  Richardson Otter, MS, OTR/L  08/09/2024, 5:31 PM

## 2024-08-10 NOTE — Therapy (Signed)
 OUTPATIENT SPEECH LANGUAGE PATHOLOGY PARKINSON'S TREATMENT   Patient Name: Orell Hurtado MRN: [id number] DOB:25-May-1966, 58 y.o., male Today's Date: 08/10/2024   PCP: Layman Piety, MD REFERRING PROVIDER: Layman Piety, MD   End of Session - 08/09/24 0859     Visit Number 2    Number of Visits 25    Date for Recertification  10/27/24    Authorization Type [employer]    Progress Note Due on Visit 10    SLP Start Time 1530    SLP Stop Time  1615    SLP Time Calculation (min) 45 min    Activity Tolerance Patient tolerated treatment well            Past Medical History:  Diagnosis Date   Anxiety    Anxiety    Arthritis    Depression    Hyperlipidemia    Insomnia    Lymphadenopathy, retroperitoneal    OSA (obstructive sleep apnea)    Parkinson disease (HCC)    Sciatica    Seizures (HCC)    Spinal stenosis    Spinal stenosis    Spondylisthesis    Past Surgical History:  Procedure Laterality Date   BACK SURGERY     BACK SURGERY     10/31/2016,12/21/2015   CARDIAC CATHETERIZATION     ESOPHAGOGASTRODUODENOSCOPY N/A 11/27/2021   Procedure: ESOPHAGOGASTRODUODENOSCOPY (EGD);  Surgeon: Toledo, Ladell POUR, MD;  Location: [facility] ENDOSCOPY;  Service: Gastroenterology;  Laterality: N/A;   KNEE ARTHROSCOPY     2018   LEFT HEART CATH AND CORONARY ANGIOGRAPHY N/A 08/05/2017   Procedure: LEFT HEART CATH AND CORONARY ANGIOGRAPHY;  Surgeon: Hester Wolm PARAS, MD;  Location: [facility] INVASIVE CV LAB;  Service: Cardiovascular;  Laterality: N/A;   TONSILECTOMY/ADENOIDECTOMY WITH MYRINGOTOMY     Patient Active Problem List   Diagnosis Date Noted   GAD (generalized anxiety disorder) 07/04/2018   PTSD (post-traumatic stress disorder) 07/04/2018   Stable angina 08/03/2017    ONSET DATE: Parkinson's dx'ed 2022, date of referral 08/01/2024  REFERRING DIAG: G20.A1 (ICD-10-CM) - Parkinson's disease (HCC)   THERAPY DIAG:  Dysarthria and anarthria  Dysphagia,  oropharyngeal phase  Rationale for Evaluation and Treatment Rehabilitation  SUBJECTIVE:   PERTINENT HISTORY: Pt is a 58 year old male with Parkinson's Disease, progressive medication wearing off time, esophageal dysphagia (most recent dilation 08/03/2024 - pt states some improvement noticed), decreased appetite with recent (08/01/2024) worsening symptoms of Parkinson's disease, including increased tremors, exceptional fatigue in his voice, significant swallowing difficulties with eating described as 'fatiguing' due to effort required to chew and move food in mouth, frequent choking episodes, poor nutrition due to fatigue associated with eating, weight loss despite attempts to increase calorie intake, Progressive dysphagia, jaw weakness, and fatigue are more severe than typical for Parkinson's disease at this stage. Differential diagnosis includes neuromuscular disorders such as myasthenia gravis and ALS.  DIAGNOSTIC FINDINGS:    EGD on 08/03/2024  Abnormal motility was noted in esophagus. The cricopharyngeus was normal. There is spasticity of the esophageal body. The distal esophagus/lower esophageal sphincter is open. No endoscopic abnormality was evident in the esophagus to explain the patient's complaint of dysphagia. It was decided, however, to proceed with dilation of the entire esophagus. Dilation was performed with a Maloney dilator wiht no resistance at 54 Fr. abnormal esophageal motility, suspicious for esophageal spasm.   EMG at [facility] scheduled for 08/31/2024  PAIN:  Are you having pain? No  FALLS: Has patient fallen in last 6 months?  No  LIVING ENVIRONMENT: Lives with: lives with their family Lives in: House/apartment  PLOF:  Level of assistance: Independent with ADLs, Independent with IADLs Employment: Full-time employment  PATIENT GOALS    to improve hypophonia and reduce choking when consuming  PO items  SUBJECTIVE STATEMENT: Pt pleasant, eager  Pt accompanied by:  self  OBJECTIVE:  TODAY'S TREATMENT:   Skilled treatment session focused on pt's communication and dysphagia goals. SLP facilitated session by providing the following interventions:  SLP introduced Text to Speech App. Skilled verbal and written instruction provided. Pt downloaded app. This clinical research associate also provided information on voice banking apps as well as shown clinic SGD. At this time, suspect Text to Speech app is more appropriate.   Skilled education provided suspected decreased respiratory support result in vocal strain. Pt voices understanding and will seek additional referrals once more definite diagnosis is found.   PATIENT REPORTED OUTCOME MEASURES (PROM):  EATING ASSESSMENT TOOL (EAT-10)   The patient was asked to rate to what extent the following statements are problematic on a scale of 0-4. 0 = No problem; 4 = Severe problem. A total score of 3 or higher is considered abnormal.  1.) My swallowing problem has caused me to lose weight. 4  2.) My swallowing problem interferes with my ability to go out for meals. 3 3.) Swallowing liquids takes extra effort. 2 4.) Swallowing solids takes extra effort. 4   5.) Swallowing pills takes extra effort. 3 6.) Swallowing is painful. 1 7.) The pleasure of eating is affected by my swallowing. 4  8.) When I swallow food sticks in my throat. 4  9.) I cough when I eat. 3 10.) Swallowing is stressful. 4    TOTAL SCORE: 32   VOICE HANDICAP INDEX (VHI)  The Voice Handicap Index is comprised of a series of questions to assess the patient's perception of their voice. It is designed to evaluate the emotional, physical and functional components of the voice problem.  Functional: 31 Physical: 35 Emotional: 23 Total: 89 (Normal mean 8.75, SD =14.97)  z score =  5.36 (severe = 3.00+)  Communicative Participation Item Bank (CPIB) Age Range: 18+   The Communicative Participation Item Bank (CPIB) is a patient-reported outcomes instrument that  targets the construct of communicative participation [employer] et al., 2013). The CPIB was developed with the intent of being appropriate for community-dwelling adults in the variety of conversational situations they frequently engage in as part of life roles at home, at work, and in social and leisure situations. All items in the CPIB start with the stem, 'Does your condition interfere with..' followed by various conversational situations such as 'Making a phone call to get information,' or 'Having a conversation in a noisy place.' Respondents choose from four response categories to rate the level of interference they experience in that situation, ranging from 'Not at all,' to 'Very much.' The item bank consists of 46 items, and a paper-and-pencil 10-item disorder-generic short form is available. The CPIB scores are reported as T-scores where 50 = the mean of the calibration sample and standard deviation = 10.  Does your condition interfere with talking with people you know? QUITE A BIT (value=1) Does your condition interfere with communicating when you need to say something quickly? QUITE A BIT (value=1) Does your condition interfere with talking with people you do NOT know? VERY MUCH (value=0) Does your condition interfere with communicating when you are out in the community (e.g., running errands, appointments)? QUITE A BIT (value=1) Does your  condition interfere with asking questions in a conversation? QUITE A BIT (value=1) Does your condition interfere with communicating in a small group of people? QUITE A BIT (value=1) Does your condition interfere with having a long conversation with someone you know about a book, movie, show or sports event? QUITE A BIT (value=1) Does your condition interfere with giving someone DETAILED information? QUITE A BIT (value=1) Does your condition interfere with getting your turn in a fast-moving conversation? VERY MUCH (value=0) Does your condition interfere with trying  to persuade a friend or family member to see a different point of view?  A LITTLE (value=2)  Pt's T-Score is 40.10 which is considered 1 SD below mean of 50.    PATIENT EDUCATION: Education details: results of this assessment, ST POC including potential voice banking and instrumental swallow study Person educated: Patient Education method: Explanation Education comprehension: verbalized understanding and needs further education   HOME EXERCISE PROGRAM: Adhere to strict aspiration precautions   GOALS: Goals reviewed with patient? Yes  SHORT TERM GOALS: Target date: 10 sessions  During each recording session, the client will self-monitor or be coached to maintain a stable vocal intensity (volume) and articulation clarity for at least 50% of the recorded phrases, as measured by the clinician's review Baseline: Goal status: INITIAL  2.  The client will identify 5 personally meaningful phrases (e.g., I love you, Thank you, favorite jokes) and record them in their natural voice, so that these messages can be preserved in their synthetic voice or message bank Baseline:  Goal status: INITIAL  3.  Patient will participate in objective swallowing evaluation (MBSS) to identify safest diet recommendation as well as therapeutic targets.  Baseline:  Goal status: INITIAL   LONG TERM GOALS: Target date: 10/27/2024  Pt will demonstrate understanding of multimodal methods of communication.  Baseline:  Goal status: INITIAL  2.  With Mod I, pt will demonstrate understanding of result and recommendations of MBSS by verbalizing salient points.  Baseline:  Goal status: INITIAL   ASSESSMENT:  CLINICAL IMPRESSION: Patient is a 58 y.o. male who was seen today for a voice and clinical swallow treatment d/t recent decline in speech and swallowing. Pt with history of Parkinson's Disease but now presenting with progressive atypical symptoms. Pt referred to [facility] for EMG to evaluate possible  presence of additional neuromuscular disorders.   Pt reports recent significant steady global decline over the last several months: rapid decline in balance, right side weakness, voice and major swallow issues. While he reports getting choked on liquids and solids, he states chewing is the biggest problem, it is absolutely fatiguing. A couple of months ago, I had to have the himleck d/t exphixa when eating. Pt reports unintentional weight loss d/t fatigue with PO consumption, especially when consuming solids.   Dysphagia He reports some improvement since EGD but continues to experience extreme oral fatigue. During this evaluation, pt presents with severe oral phase and mild pharyngeal phase dysphagia. When consuming thin liquids via cup, applesauce and graham crackers, pt's oral phase is c/b decreased lingual and mandibular movement, appearance of reduced bolus manipulation and effortful AP transfer of all boluses. Severity of impairment is observed more so with single bit of graham cracker. Pt with attempts to orally manipulate bolus for > 1 minutes with fistulations observed in bilateral masseter muscles. He also describes pharyngeal sensation of effort to push it (bolus) down (his throat). It is better since yesterday (endoscopy) but still takes effort. Education provided on increasing consumption of protein  shakes, soft meats such as tuna, yogurt. Will request order for a Modified Barium Swallow Study.   Dysarthria  Pt presents with severe dysarthria/dysphonia c/b severely reduced respiratory support, hoarse, breathy, raspy, strained vocal quality resulting in  subjective description of whispering. Pt with little audible phonation, inability to project his voice and significant vocal fatigue. Pt's articulatory movement is precise with no slurring observed.   Pt eager to further investigate the above mentioned strategies. See the above treatment note for details.   OBJECTIVE IMPAIRMENTS include  dysarthria, voice disorder, and dysphagia. These impairments are limiting patient from effectively communicating at home and in community and safety when swallowing. Factors affecting potential to achieve goals and functional outcome are medical prognosis, previous level of function, and severity of impairments. Patient will benefit from skilled SLP services to address above impairments and improve overall function.  REHAB POTENTIAL: Good  PLAN: SLP FREQUENCY: 1-2x/week  SLP DURATION: 12 weeks  PLANNED INTERVENTIONS: Aspiration precaution training, Diet toleration management , Trials of upgraded texture/liquids, Functional tasks, Multimodal communication approach, SLP instruction and feedback, Compensatory strategies, and Patient/family education    Happi B. Rubbie, M.S., CCC-SLP, Tree Surgeon Certified Brain Injury Specialist [facility]  [facility] Rehabilitation Services Office [phone] Ascom [phone] Fax [phone]

## 2024-08-16 ENCOUNTER — Ambulatory Visit: Payer: Self-pay | Admitting: Occupational Therapy

## 2024-08-18 ENCOUNTER — Ambulatory Visit: Payer: Self-pay | Attending: Neurology | Admitting: Occupational Therapy

## 2024-08-18 ENCOUNTER — Ambulatory Visit: Admitting: Speech Pathology

## 2024-08-18 ENCOUNTER — Other Ambulatory Visit: Payer: Self-pay | Admitting: Gastroenterology

## 2024-08-18 DIAGNOSIS — R1312 Dysphagia, oropharyngeal phase: Secondary | ICD-10-CM

## 2024-08-18 DIAGNOSIS — R471 Dysarthria and anarthria: Secondary | ICD-10-CM | POA: Diagnosis present

## 2024-08-18 DIAGNOSIS — R278 Other lack of coordination: Secondary | ICD-10-CM | POA: Insufficient documentation

## 2024-08-18 DIAGNOSIS — G20A1 Parkinson's disease without dyskinesia, without mention of fluctuations: Secondary | ICD-10-CM

## 2024-08-18 DIAGNOSIS — R2681 Unsteadiness on feet: Secondary | ICD-10-CM | POA: Diagnosis present

## 2024-08-18 DIAGNOSIS — R262 Difficulty in walking, not elsewhere classified: Secondary | ICD-10-CM | POA: Diagnosis present

## 2024-08-18 DIAGNOSIS — M6281 Muscle weakness (generalized): Secondary | ICD-10-CM | POA: Diagnosis present

## 2024-08-18 DIAGNOSIS — R1319 Other dysphagia: Secondary | ICD-10-CM

## 2024-08-18 NOTE — Therapy (Incomplete)
 OUTPATIENT OCCUPATIONAL THERAPY NEURO TREATMENT NOTE  Patient Name: Dakota Davis MRN: [id number] DOB:Feb 08, 1966, 58 y.o., male Today's Date: 08/18/2024  PCP: Lenon Layman ORN, MD REFERRING PROVIDER: Onita Elspeth Sharper, MD  END OF SESSION:  OT End of Session - 08/18/24 2353     Visit Number 5    Number of Visits 12    Date for Recertification  10/12/24    OT Start Time 0930    OT Stop Time 1015    OT Time Calculation (min) 45 min    Activity Tolerance Patient tolerated treatment well    Behavior During Therapy [facility] for tasks assessed/performed          Past Medical History:  Diagnosis Date  . Anxiety   . Anxiety   . Arthritis   . Depression   . Hyperlipidemia   . Insomnia   . Lymphadenopathy, retroperitoneal   . OSA (obstructive sleep apnea)   . Parkinson disease (HCC)   . Sciatica   . Seizures (HCC)   . Spinal stenosis   . Spinal stenosis   . Spondylisthesis    Past Surgical History:  Procedure Laterality Date  . BACK SURGERY    . BACK SURGERY     10/31/2016,12/21/2015  . CARDIAC CATHETERIZATION    . ESOPHAGOGASTRODUODENOSCOPY N/A 11/27/2021   Procedure: ESOPHAGOGASTRODUODENOSCOPY (EGD);  Surgeon: Toledo, Ladell POUR, MD;  Location: [facility] ENDOSCOPY;  Service: Gastroenterology;  Laterality: N/A;  . KNEE ARTHROSCOPY     2018  . LEFT HEART CATH AND CORONARY ANGIOGRAPHY N/A 08/05/2017   Procedure: LEFT HEART CATH AND CORONARY ANGIOGRAPHY;  Surgeon: Hester Wolm PARAS, MD;  Location: [facility] INVASIVE CV LAB;  Service: Cardiovascular;  Laterality: N/A;  . TONSILECTOMY/ADENOIDECTOMY WITH MYRINGOTOMY     Patient Active Problem List   Diagnosis Date Noted  . GAD (generalized anxiety disorder) 07/04/2018  . PTSD (post-traumatic stress disorder) 07/04/2018  . Stable angina 08/03/2017    ONSET DATE: 09/2020  REFERRING DIAG: Parkinson's disease  THERAPY DIAG:  Muscle weakness (generalized)  Rationale for Evaluation and Treatment: Rehabilitation  SUBJECTIVE:    SUBJECTIVE STATEMENT:   Pt. reports that he is planning to travel to [place]  for Christmas.  Pt accompanied by: self  PERTINENT HISTORY: Patient is a 58 year old male with a diagnosis of Parkinson's disease.  Patient was referred to occupational therapy for limited fine motor coordination skills and bilateral hand tremors.  Patient's hand tremors started with the left  hand with a gradual progression to the right hand.  [name].  Patient has a recent history of severe right wrist and hand pain and has a upcoming orthopedic consult early next week on 07/25/2024.  Patient reports having a history of a triple cervical fusion last year.  Past medical history includes OSA, major depressive disorder (current), and prediabetes.  PRECAUTIONS: None  WEIGHT BEARING RESTRICTIONS: No  PAIN:  Are you having pain?   No reports of pain  FALLS: Has patient fallen in last 6 months? No  LIVING ENVIRONMENT: Lives with: [name] with wife, [name], and youngest son Lives in: House/apartment Stairs:  two levels, 3 little steps from garage Has following equipment at home: None  PLOF: Independent  PATIENT GOALS:  To improve FMC   OBJECTIVE:  Note: Objective measures were completed at Evaluation unless otherwise noted.  HAND DOMINANCE: Right  ADLs:  Transfers/ambulation related to ADLs: Independent Eating: Increased time, difficulty at times cutting meat, Independent holding a full drink-two hands initially 2/2  onset of right hand pain Grooming: Independent UB Dressing: Independent LB Dressing: Independent; occasionally has to redo tying shoes. 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 Leisure: Being outside, yard work, reading history research    MOBILITY STATUS: Independent  POSTURE COMMENTS:  Intact  ACTIVITY TOLERANCE: Activity tolerance: Depends on the task he is doing, typically approximately 30 min.  FUNCTIONAL OUTCOME MEASURES: MAM-20: 65/80  UPPER EXTREMITY ROM:  *History of left shoulder tears.  Active ROM Right Eval WFL Left Eval [facility]  Shoulder flexion    Shoulder abduction    Shoulder adduction    Shoulder extension    Shoulder internal rotation    Shoulder external rotation    Elbow flexion    Elbow extension    Wrist flexion    Wrist extension    Wrist ulnar deviation    Wrist radial deviation    Wrist pronation    Wrist supination    (Blank rows = not tested)  UPPER EXTREMITY MMT:     MMT Right eval Left eval  Shoulder flexion 5 5  Shoulder abduction 5 5  Shoulder adduction    Shoulder extension    Shoulder internal rotation    Shoulder external rotation    Middle trapezius    Lower trapezius    Elbow flexion 5 5  Elbow extension 5 5  Wrist flexion 4- 5  Wrist extension 4- 5  Wrist ulnar deviation    Wrist radial deviation    Wrist pronation 5 5  Wrist supination 5 5  (Blank rows = not tested)  HAND FUNCTION: Grip strength: Right: 94 lbs; Left: 112 lbs, Lateral pinch: Right: 38 lbs, Left: 37 lbs, and 3 point pinch: Right: 32 lbs, Left: 25 lbs  COORDINATION: 9 Hole Peg test: Right: 28 sec; Left: 37 sec  SENSATION: Light touch: WFL Proprioception: WFL  EDEMA: Intact  MUSCLE TONE: WFL  COGNITION: Overall cognitive status: Patient reports having [name] to concentrate while talking, as he loses his train of thoughts more frequently.  Patient reports concern about the the volume of his voice has changing. Patient also reports changes in moving food  through his mouth in preparation for swallowing.  A a referral for speech therapy consult was requested  VISION: Patient reports planning to follow-up with his eye-care physician                                                                                                                      TREATMENT DATE: 08/18/2024  Therapeutic Activities:   -Facilitated progressive gross grip strengthening with  28.9# of grip strength resistive force.  -Facilitated translatory movements of the hand moving objects from the palm  to the tip of the 2nd digit and thumb in preparation for placing them vertically into the pegboard. -Facilitated  translatory movements moving clips from the lateral pinch position to the 3pt. Pinch position in preparation for securely placing them on the dowel to promote hand function skills.  PATIENT EDUCATION:  Education details:  hand strengthening with blue theraputty Person educated: Patient Education method: Explanation, Demonstration, Tactile cues, and Verbal cues Education comprehension: verbalized understanding, returned demonstration, verbal cues required, tactile cues required, and needs further education  HOME EXERCISE PROGRAM:  -Hand strengthening with blue theraputty -Condition management: compensatory strategies for tremors stabilizing proximally for forearm support for distal control. -bilateral [facility] skills.   GOALS: Goals reviewed with patient? Yes  SHORT TERM GOALS: Target date: 08/31/2024   Patient will be independent with HEPs for BUE functioning. Baseline: Eval: Current home exercise program Goal status: INITIAL  LONG TERM GOALS: Target date: 10/12/2024  Patient will independently and efficiently be able to cut meat. Baseline: Eval: Patient presents with difficulty cutting meat efficiently Goal status: INITIAL  2.  Patient will independently grasp, and manipulate multiple medication pills efficiently with his right hand Baseline: Eval: Pt.  Has difficulty manipulating to pills at a time efficiently in his hand. Goal status: INITIAL  3.  Patient will independently and efficiently be able to clip nails Baseline: Eval: Patient scored 2-3/4 for cutting nails with a nail clipper portion on the Mam-20 Goal status: INITIAL  4.  Patient will independently and efficiently food on his plate Baseline: Eval: Patient has difficulty cutting meat. Goal status: INITIAL  5.  Patient will grasp 1 item efficiently out of a full pocket Baseline: Eval: Patient has difficulty grasping 1 item efficiently from multiple items in his pocket Goal status: INITIAL  6.  Patient will efficiently write a 3 sentence paragraph with 75% legibility and minimal micrographia  Baseline: Eval: Name only 50% legibility with moderate micrographia Goal status: INITIAL  ASSESSMENT:  CLINICAL IMPRESSION:  Pt. required cues initially for hand, and pinch position on the resistive clips. Pt. was able to efficiently complete the [place] task with the right hand. Pt. Required increased time to complete both the simple, and complex mazes during the [place] task. Patient continues to benefit from OT services to work on improving hand function skills in order to maximize overall independence with ADLs and IADL tasks  PERFORMANCE DEFICITS: in functional skills including ADLs, IADLs, coordination, dexterity, ROM, strength, pain, Fine motor control, and Gross motor control, cognitive skills including attention and memory, and psychosocial skills including coping strategies, environmental adaptation, interpersonal interactions, and routines and behaviors.   IMPAIRMENTS: are limiting patient from ADLs, IADLs, and leisure.   CO-MORBIDITIES: may have co-morbidities  that affects occupational performance. Patient will benefit from skilled OT to address above impairments and improve overall function.  MODIFICATION OR ASSISTANCE TO COMPLETE EVALUATION: Min-Moderate modification of tasks  or assist with assess necessary to complete an evaluation.  OT OCCUPATIONAL PROFILE AND HISTORY: Detailed assessment: Review of records and additional review of physical, cognitive, psychosocial history related to current functional performance.  CLINICAL DECISION MAKING: Moderate - several treatment options, min-mod task modification necessary  REHAB POTENTIAL: Good  EVALUATION COMPLEXITY: Moderate    PLAN:  OT FREQUENCY: 1x a week  OT DURATION: 12 weeks  PLANNED INTERVENTIONS: 97168 OT Re-evaluation, 97535 self care/ADL training, 02889 therapeutic exercise, 97530 therapeutic activity, 97112 neuromuscular re-education, 97140 manual therapy, 97018 paraffin, 02989 moist heat, 97034 contrast bath, 97033 iontophoresis, 02870 Cognitive training (first 15  min), 02869 Cognitive training(each additional 15 min), 02239 Orthotic Initial, 97763 Orthotic/Prosthetic subsequent, energy conservation, coping strategies training, patient/family education, and DME and/or AE instructions  RECOMMENDED OTHER SERVICES: ST  CONSULTED AND AGREED WITH PLAN OF CARE: Patient  PLAN FOR NEXT SESSION: Treatment  Erienne Spelman, MS, OTR/L  08/18/2024, 11:58 PM

## 2024-08-18 NOTE — Therapy (Signed)
 OUTPATIENT OCCUPATIONAL THERAPY NEURO TREATMENT NOTE  Patient Name: Dakota Davis MRN: [id number] DOB:1966-06-11, 58 y.o., male Today's Date: 08/18/2024  PCP: Lenon Layman ORN, MD REFERRING PROVIDER: Onita Elspeth Sharper, MD  END OF SESSION:  OT End of Session - 08/18/24 2353     Visit Number 5    Number of Visits 12    Date for Recertification  10/12/24    OT Start Time 0930    OT Stop Time 1015    OT Time Calculation (min) 45 min    Activity Tolerance Patient tolerated treatment well    Behavior During Therapy [facility] for tasks assessed/performed          Past Medical History:  Diagnosis Date   Anxiety    Anxiety    Arthritis    Depression    Hyperlipidemia    Insomnia    Lymphadenopathy, retroperitoneal    OSA (obstructive sleep apnea)    Parkinson disease (HCC)    Sciatica    Seizures (HCC)    Spinal stenosis    Spinal stenosis    Spondylisthesis    Past Surgical History:  Procedure Laterality Date   BACK SURGERY     BACK SURGERY     10/31/2016,12/21/2015   CARDIAC CATHETERIZATION     ESOPHAGOGASTRODUODENOSCOPY N/A 11/27/2021   Procedure: ESOPHAGOGASTRODUODENOSCOPY (EGD);  Surgeon: Toledo, Ladell POUR, MD;  Location: [facility] ENDOSCOPY;  Service: Gastroenterology;  Laterality: N/A;   KNEE ARTHROSCOPY     2018   LEFT HEART CATH AND CORONARY ANGIOGRAPHY N/A 08/05/2017   Procedure: LEFT HEART CATH AND CORONARY ANGIOGRAPHY;  Surgeon: Hester Wolm PARAS, MD;  Location: [facility] INVASIVE CV LAB;  Service: Cardiovascular;  Laterality: N/A;   TONSILECTOMY/ADENOIDECTOMY WITH MYRINGOTOMY     Patient Active Problem List   Diagnosis Date Noted   GAD (generalized anxiety disorder) 07/04/2018   PTSD (post-traumatic stress disorder) 07/04/2018   Stable angina 08/03/2017    ONSET DATE: 09/2020  REFERRING DIAG: Parkinson's disease  THERAPY DIAG:  Muscle weakness (generalized)  Rationale for Evaluation and Treatment: Rehabilitation  SUBJECTIVE:   SUBJECTIVE  STATEMENT:   Pt. reports that he is planning to travel to [place]  for Christmas.  Pt accompanied by: self  PERTINENT HISTORY: Patient is a 58 year old male with a diagnosis of Parkinson's disease.  Patient was referred to occupational therapy for limited fine motor coordination skills and bilateral hand tremors.  Patient's hand tremors started with the left  hand with a gradual progression to the right hand.  [name].  Patient has a recent history of severe right wrist and hand pain and has a upcoming orthopedic consult early next week on 07/25/2024.  Patient reports having a history of a triple cervical fusion last year.  Past medical history includes OSA, major depressive disorder (current), and prediabetes.  PRECAUTIONS: None  WEIGHT BEARING RESTRICTIONS: No  PAIN:  Are you having pain?   No reports of pain  FALLS: Has patient fallen in last 6 months? No  LIVING ENVIRONMENT: Lives with: [name] with wife, [name], and youngest son Lives in: House/apartment Stairs:  two levels, 3 little steps from garage Has following equipment at home: None  PLOF: Independent  PATIENT GOALS:  To improve FMC   OBJECTIVE:  Note: Objective measures were completed at Evaluation unless otherwise noted.  HAND DOMINANCE: Right  ADLs:  Transfers/ambulation related to ADLs: Independent Eating: Increased time, difficulty at times cutting meat, Independent holding a full drink-two hands initially 2/2  onset of right hand pain Grooming: Independent UB Dressing: Independent LB Dressing: Independent; occasionally has to redo tying shoes. 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 Leisure: Being outside, yard work, reading history research    MOBILITY STATUS: Independent  POSTURE COMMENTS:  Intact  ACTIVITY TOLERANCE: Activity tolerance: Depends on the task he is doing, typically approximately 30 min.  FUNCTIONAL OUTCOME MEASURES: MAM-20: 65/80  UPPER EXTREMITY ROM:  *History of left shoulder tears.  Active ROM Right Eval WFL Left Eval [facility]  Shoulder flexion    Shoulder abduction    Shoulder adduction    Shoulder extension    Shoulder internal rotation    Shoulder external rotation    Elbow flexion    Elbow extension    Wrist flexion    Wrist extension    Wrist ulnar deviation    Wrist radial deviation    Wrist pronation    Wrist supination    (Blank rows = not tested)  UPPER EXTREMITY MMT:     MMT Right eval Left eval  Shoulder flexion 5 5  Shoulder abduction 5 5  Shoulder adduction    Shoulder extension    Shoulder internal rotation    Shoulder external rotation    Middle trapezius    Lower trapezius    Elbow flexion 5 5  Elbow extension 5 5  Wrist flexion 4- 5  Wrist extension 4- 5  Wrist ulnar deviation    Wrist radial deviation    Wrist pronation 5 5  Wrist supination 5 5  (Blank rows = not tested)  HAND FUNCTION: Grip strength: Right: 94 lbs; Left: 112 lbs, Lateral pinch: Right: 38 lbs, Left: 37 lbs, and 3 point pinch: Right: 32 lbs, Left: 25 lbs with the Saehan Pinch Meter  Grip strength: Right: 110 lbs; Left: 121 lbs, Lateral pinch: Right: 24 lbs, Left: 21 lbs, and 3 point pinch: Right: 22 lbs, Left: 19 lbs with the Jamar Pinch Meter   COORDINATION: 9 Hole Peg test: Right: 28 sec; Left: 37 sec  SENSATION: Light touch: WFL Proprioception: WFL  EDEMA: Intact  MUSCLE TONE: WFL  COGNITION: Overall cognitive status: Patient reports having [name] to concentrate while  talking, as he loses his train of thoughts more frequently.  Patient reports concern about the the volume of his voice has changing. Patient also reports changes in moving food through his mouth in preparation for swallowing.  A a referral for speech therapy consult was requested  VISION: Patient reports planning to follow-up with his eye-care physician  TREATMENT DATE: 08/18/2024  Therapeutic Activities:   -Facilitated progressive right gross grip strengthening with  28.9# of grip strength resistive force.  -Facilitated translatory movements of the hand storing 4-5 pegs in his hand, and moving them from the palm to the tip of the 2nd digit and thumb in preparation for placing them vertically into the pegboard. -Facilitated  translatory movements moving clips from the lateral pinch position to the 3pt. Pinch position in preparation for securely placing them on the dowel to promote hand function skills.  Neuromuscular re-education:   -Facilitated left hand FMC tasks using the Grooved pegboard, grasping and 1 grooved pegs from a horizontal position in the shallow dish, and transitioning them to a vertical position in preparation for placing them upright into the pegboard.  -Facilitated translatory movements of the hand moving the 1 objects from the palm to the tip of the 2nd digit and thumb in preparation for placing them into a container.  -Facilitated fine motor coordination skills removing the grooved pegs by alternating thumb opposition to the 2nd through 5th digits.   PATIENT EDUCATION:  Education details:  hand strengthening with blue theraputty Person educated: Patient Education method: Explanation, Demonstration, Tactile cues, and Verbal cues Education comprehension: verbalized understanding, returned demonstration, verbal cues required, tactile cues required, and  needs further education  HOME EXERCISE PROGRAM:  -Hand strengthening with blue theraputty -Condition management: compensatory strategies for tremors stabilizing proximally for forearm support for distal control. -bilateral [facility] skills.   GOALS: Goals reviewed with patient? Yes  SHORT TERM GOALS: Target date: 08/31/2024   Patient will be independent with HEPs for BUE functioning. Baseline: Eval: Current home exercise program Goal status: INITIAL  LONG TERM GOALS: Target date: 10/12/2024  Patient will independently and efficiently be able to cut meat. Baseline: Eval: Patient presents with difficulty cutting meat efficiently Goal status: INITIAL  2.  Patient will independently grasp, and manipulate multiple medication pills efficiently with his right hand Baseline: Eval: Pt. Has difficulty manipulating to pills at a time efficiently in his hand. Goal status: INITIAL  3.  Patient will independently and efficiently be able to clip nails Baseline: Eval: Patient scored 2-3/4 for cutting nails with a nail clipper portion on the Mam-20 Goal status: INITIAL  4.  Patient will independently and efficiently food on his plate Baseline: Eval: Patient has difficulty cutting meat. Goal status: INITIAL  5.  Patient will grasp 1 item efficiently out of a full pocket Baseline: Eval: Patient has difficulty grasping 1 item efficiently from multiple items in his pocket Goal status: INITIAL  6.  Patient will efficiently write a 3 sentence paragraph with 75% legibility and minimal micrographia  Baseline: Eval: Name only 50% legibility with moderate micrographia Goal status: INITIAL  ASSESSMENT:  CLINICAL IMPRESSION:  Pt. has improved with bilateral grip strength significantly since the initial evaluation. Pt.'s dominate right hand continues to be weaker than the nondominant left hand. Pt. was able to efficiently move the pegs through his hand and set them up in the pegboard independently,  however fatigues during the grip strength task. Patient was able to efficiently use all levels of resistive clips.  Patient presents with impaired motor control and coordination skills having difficulty performing thumb opposition tasks efficiently. Patient presented with less difficulty performing fine motor coordination tasks flat at the tabletop surface, and was more efficiently able to perform the task with the board at a vertical incline when placing the pegs. Patient continues to benefit from OT services to work on improving  hand function skills in order to maximize overall independence with ADLs and IADL tasks  PERFORMANCE DEFICITS: in functional skills including ADLs, IADLs, coordination, dexterity, ROM, strength, pain, Fine motor control, and Gross motor control, cognitive skills including attention and memory, and psychosocial skills including coping strategies, environmental adaptation, interpersonal interactions, and routines and behaviors.   IMPAIRMENTS: are limiting patient from ADLs, IADLs, and leisure.   CO-MORBIDITIES: may have co-morbidities  that affects occupational performance. Patient will benefit from skilled OT to address above impairments and improve overall function.  MODIFICATION OR ASSISTANCE TO COMPLETE EVALUATION: Min-Moderate modification of tasks or assist with assess necessary to complete an evaluation.  OT OCCUPATIONAL PROFILE AND HISTORY: Detailed assessment: Review of records and additional review of physical, cognitive, psychosocial history related to current functional performance.  CLINICAL DECISION MAKING: Moderate - several treatment options, min-mod task modification necessary  REHAB POTENTIAL: Good  EVALUATION COMPLEXITY: Moderate    PLAN:  OT FREQUENCY: 1x a week  OT DURATION: 12 weeks  PLANNED INTERVENTIONS: 97168 OT Re-evaluation, 97535 self care/ADL training, 02889 therapeutic exercise, 97530 therapeutic activity, 97112 neuromuscular  re-education, 97140 manual therapy, 97018 paraffin, 02989 moist heat, 97034 contrast bath, 97033 iontophoresis, 97129 Cognitive training (first 15 min), 02869 Cognitive training(each additional 15 min), 02239 Orthotic Initial, 97763 Orthotic/Prosthetic subsequent, energy conservation, coping strategies training, patient/family education, and DME and/or AE instructions  RECOMMENDED OTHER SERVICES: ST  CONSULTED AND AGREED WITH PLAN OF CARE: Patient  PLAN FOR NEXT SESSION: Treatment  Richardson Otter, MS, OTR/L  08/18/2024, 11:58 PM

## 2024-08-18 NOTE — Therapy (Addendum)
 " OUTPATIENT SPEECH LANGUAGE PATHOLOGY PARKINSON'S TREATMENT   Patient Name: Dakota Davis MRN: [id number] DOB:1966/07/19, 58 y.o., male Today's Date: 08/18/2024   PCP: Layman Piety, MD REFERRING PROVIDER: Layman Piety, MD   End of Session - 08/18/2024    Visit Number 3    Number of Visits 25    Date for Recertification  10/27/24    Authorization Type [employer]    Progress Note Due on Visit 10    SLP Start Time 0900   SLP Stop Time  0930   SLP Time Calculation (min) 30 min    Activity Tolerance Patient tolerated treatment well            Past Medical History:  Diagnosis Date   Anxiety    Anxiety    Arthritis    Depression    Hyperlipidemia    Insomnia    Lymphadenopathy, retroperitoneal    OSA (obstructive sleep apnea)    Parkinson disease (HCC)    Sciatica    Seizures (HCC)    Spinal stenosis    Spinal stenosis    Spondylisthesis    Past Surgical History:  Procedure Laterality Date   BACK SURGERY     BACK SURGERY     10/31/2016,12/21/2015   CARDIAC CATHETERIZATION     ESOPHAGOGASTRODUODENOSCOPY N/A 11/27/2021   Procedure: ESOPHAGOGASTRODUODENOSCOPY (EGD);  Surgeon: Toledo, Ladell POUR, MD;  Location: [facility] ENDOSCOPY;  Service: Gastroenterology;  Laterality: N/A;   KNEE ARTHROSCOPY     2018   LEFT HEART CATH AND CORONARY ANGIOGRAPHY N/A 08/05/2017   Procedure: LEFT HEART CATH AND CORONARY ANGIOGRAPHY;  Surgeon: Hester Wolm PARAS, MD;  Location: [facility] INVASIVE CV LAB;  Service: Cardiovascular;  Laterality: N/A;   TONSILECTOMY/ADENOIDECTOMY WITH MYRINGOTOMY     Patient Active Problem List   Diagnosis Date Noted   GAD (generalized anxiety disorder) 07/04/2018   PTSD (post-traumatic stress disorder) 07/04/2018   Stable angina 08/03/2017    ONSET DATE: Parkinson's dx'ed 2022, date of referral 08/01/2024  REFERRING DIAG: G20.A1 (ICD-10-CM) - Parkinson's disease (HCC)   THERAPY DIAG:  Dysarthria and anarthria  Dysphagia, oropharyngeal  phase  Rationale for Evaluation and Treatment Rehabilitation  SUBJECTIVE:   PERTINENT HISTORY: Pt is a 58 year old male with Parkinson's Disease, progressive medication wearing off time, esophageal dysphagia (most recent dilation 08/03/2024 - pt states some improvement noticed), decreased appetite with recent (08/01/2024) worsening symptoms of Parkinson's disease, including increased tremors, exceptional fatigue in his voice, significant swallowing difficulties with eating described as 'fatiguing' due to effort required to chew and move food in mouth, frequent choking episodes, poor nutrition due to fatigue associated with eating, weight loss despite attempts to increase calorie intake, Progressive dysphagia, jaw weakness, and fatigue are more severe than typical for Parkinson's disease at this stage. Differential diagnosis includes neuromuscular disorders such as myasthenia gravis and ALS.  DIAGNOSTIC FINDINGS:    EGD on 08/03/2024  Abnormal motility was noted in esophagus. The cricopharyngeus was normal. There is spasticity of the esophageal body. The distal esophagus/lower esophageal sphincter is open. No endoscopic abnormality was evident in the esophagus to explain the patient's complaint of dysphagia. It was decided, however, to proceed with dilation of the entire esophagus. Dilation was performed with a Maloney dilator wiht no resistance at 54 Fr. abnormal esophageal motility, suspicious for esophageal spasm.   EMG at [facility] scheduled for 08/31/2024  PAIN:  Are you having pain? No  FALLS: Has patient fallen in last 6 months?  No  LIVING ENVIRONMENT:  Lives with: lives with their family Lives in: House/apartment  PLOF:  Level of assistance: Independent with ADLs, Independent with IADLs Employment: Full-time employment  PATIENT GOALS    to improve hypophonia and reduce choking when consuming  PO items  SUBJECTIVE STATEMENT: Pt pleasant, eager  Pt accompanied by:  self  OBJECTIVE:  TODAY'S TREATMENT:   Skilled treatment session focused on pt's communication and dysphagia goals. SLP facilitated session by providing the following interventions:  Worked on coordinating Modified Brium Swallow Study for December 11th at 1015, order faxed to radiology  Pt continues to present with decreased vocal intensity and states I have noticed that I need to stop and take deep breaths more often during speech activities.   She further reports improved ability to consume some food items on Thanksgiving Day but every nite I had to put the spoon in my mouth, and then grab my drink and work to get it to the pont fo being able to swallow and then it feels like it sticks right here (near [employer]) with more liquid needed to get the food to go further down. Pt arrived sipping on thin liquids via cup. Intermittent cough and throat clear observed.   Pt further reports no ability to wiggle the toes on his right foot, flex foot at the ankle, needing to use my momentum to swing my right leg when I am walking. This physical decline has occurred over last 2 weeks. He reports near fall when opening car door and use of a walking stick over the last week. This clinical research associate will reach out to pt's neurologist and request order for PT evaluation.      PATIENT EDUCATION: Education details: see above Person educated: Patient Education method: Explanation Education comprehension: verbalized understanding and needs further education   HOME EXERCISE PROGRAM: Adhere to strict aspiration precautions   GOALS: Goals reviewed with patient? Yes  SHORT TERM GOALS: Target date: 10 sessions  During each recording session, the client will self-monitor or be coached to maintain a stable vocal intensity (volume) and articulation clarity for at least 50% of the recorded phrases, as measured by the clinicians review Baseline: Goal status: INITIAL  2.  The client will identify 5 personally  meaningful phrases (e.g., I love you, Thank you, favorite jokes) and record them in their natural voice, so that these messages can be preserved in their synthetic voice or message bank Baseline:  Goal status: INITIAL  3.  Patient will participate in objective swallowing evaluation (MBSS) to identify safest diet recommendation as well as therapeutic targets.  Baseline:  Goal status: INITIAL   LONG TERM GOALS: Target date: 10/27/2024  Pt will demonstrate understanding of multimodal methods of communication.  Baseline:  Goal status: INITIAL  2.  With Mod I, pt will demonstrate understanding of result and recommendations of MBSS by verbalizing salient points.  Baseline:  Goal status: INITIAL   ASSESSMENT:  CLINICAL IMPRESSION: Patient is a 58 y.o. male who was seen today for a voice and clinical swallow treatment d/t recent decline in speech and swallowing. Pt with history of Parkinson's Disease but now presenting with progressive atypical symptoms. Pt referred to [facility] for EMG to evaluate possible presence of additional neuromuscular disorders.   Pt reports recent significant steady global decline over the last several months: rapid decline in balance, right side weakness, voice and major swallow issues. While he reports getting choked on liquids and solids, he states chewing is the biggest problem, it is absolutely fatiguing. A couple  of months ago, I had to have the himleck d/t exphixa when eating. Pt reports unintentional weight loss d/t fatigue with PO consumption, especially when consuming solids.   Dysphagia He reports some improvement since EGD but continues to experience extreme oral fatigue. During this evaluation, pt presents with severe oral phase and mild pharyngeal phase dysphagia. When consuming thin liquids via cup, applesauce and graham crackers, pt's oral phase is c/b decreased lingual and mandibular movement, appearance of reduced bolus manipulation and effortful AP  transfer of all boluses. Severity of impairment is observed more so with single bit of graham cracker. Pt with attempts to orally manipulate bolus for > 1 minutes with fistulations observed in bilateral masseter muscles. He also describes pharyngeal sensation of effort to push it (bolus) down (his throat). It is better since yesterday (endoscopy) but still takes effort. Education provided on increasing consumption of protein shakes, soft meats such as tuna, yogurt. Will request order for a Modified Barium Swallow Study.   Dysarthria  Pt presents with severe dysarthria/dysphonia c/b severely reduced respiratory support, hoarse, breathy, raspy, strained vocal quality resulting in  subjective description of whispering. Pt with little audible phonation, inability to project his voice and significant vocal fatigue. Pt's articulatory movement is precise with no slurring observed.   Pt with continued progressive decline. Will make neurologist aware, seek PT referral. EMG scheduled for December 17th.  See the above treatment note for details.   OBJECTIVE IMPAIRMENTS include dysarthria, voice disorder, and dysphagia. These impairments are limiting patient from effectively communicating at home and in community and safety when swallowing. Factors affecting potential to achieve goals and functional outcome are medical prognosis, previous level of function, and severity of impairments. Patient will benefit from skilled SLP services to address above impairments and improve overall function.  REHAB POTENTIAL: Good  PLAN: SLP FREQUENCY: 1-2x/week  SLP DURATION: 12 weeks  PLANNED INTERVENTIONS: Aspiration precaution training, Diet toleration management , Trials of upgraded texture/liquids, Functional tasks, Multimodal communication approach, SLP instruction and feedback, Compensatory strategies, and Patient/family education    Derrel Moore B. Rubbie, M.S., CCC-SLP, CBIS Speech-Language Pathologist Certified Brain  Injury Specialist [facility]  [facility] [phone] Ascom [phone] Fax [phone]  "

## 2024-08-19 ENCOUNTER — Other Ambulatory Visit: Payer: Self-pay | Admitting: Podiatry

## 2024-08-19 MED ORDER — TERBINAFINE HCL 250 MG PO TABS: 250.0000 mg | ORAL_TABLET | Freq: Every day | ORAL | 0 refills | Status: AC

## 2024-08-19 NOTE — Progress Notes (Signed)
 58 year old male came in today with his son, Alden Bensinger, for evaluation of toenail fungus.  Father states that he also has discoloration and thickening to the toenails.  After evaluation and discussion of toenail fungus including the pathology and etiology as well as treatment options including oral, topical, and laser antifungal treatment modalities with associated risks and benefits and relative efficacies.  Also evaluated his toenails which are hyperkeratotic and dystrophic, we decided to go ahead and prescribe him prescription for Lamisil .  CMP 08/01/2024 hepatic function WNL.  Prescription for Lamisil  250mg  #90 daily.  Return to clinic PRN

## 2024-08-23 ENCOUNTER — Ambulatory Visit: Admitting: Speech Pathology

## 2024-08-23 ENCOUNTER — Ambulatory Visit: Payer: Self-pay | Admitting: Occupational Therapy

## 2024-08-23 DIAGNOSIS — R1312 Dysphagia, oropharyngeal phase: Secondary | ICD-10-CM

## 2024-08-23 DIAGNOSIS — R278 Other lack of coordination: Secondary | ICD-10-CM

## 2024-08-23 DIAGNOSIS — R471 Dysarthria and anarthria: Secondary | ICD-10-CM

## 2024-08-23 DIAGNOSIS — M6281 Muscle weakness (generalized): Secondary | ICD-10-CM

## 2024-08-23 NOTE — Therapy (Signed)
 OUTPATIENT SPEECH LANGUAGE PATHOLOGY PARKINSON'S TREATMENT   Patient Name: Dakota Davis MRN: [id number] DOB:27-Dec-1965, 58 y.o., male Today's Date: 08/23/2024   PCP: Layman Piety, MD REFERRING PROVIDER: Layman Piety, MD   End of Session - 08/09/24 0859     Visit Number 2    Number of Visits 25    Date for Recertification  10/27/24    Authorization Type [employer]    Progress Note Due on Visit 10    SLP Start Time 1530    SLP Stop Time  1615    SLP Time Calculation (min) 45 min    Activity Tolerance Patient tolerated treatment well            Past Medical History:  Diagnosis Date   Anxiety    Anxiety    Arthritis    Depression    Hyperlipidemia    Insomnia    Lymphadenopathy, retroperitoneal    OSA (obstructive sleep apnea)    Parkinson disease (HCC)    Sciatica    Seizures (HCC)    Spinal stenosis    Spinal stenosis    Spondylisthesis    Past Surgical History:  Procedure Laterality Date   BACK SURGERY     BACK SURGERY     10/31/2016,12/21/2015   CARDIAC CATHETERIZATION     ESOPHAGOGASTRODUODENOSCOPY N/A 11/27/2021   Procedure: ESOPHAGOGASTRODUODENOSCOPY (EGD);  Surgeon: Toledo, Ladell POUR, MD;  Location: [facility] ENDOSCOPY;  Service: Gastroenterology;  Laterality: N/A;   KNEE ARTHROSCOPY     2018   LEFT HEART CATH AND CORONARY ANGIOGRAPHY N/A 08/05/2017   Procedure: LEFT HEART CATH AND CORONARY ANGIOGRAPHY;  Surgeon: Hester Wolm PARAS, MD;  Location: [facility] INVASIVE CV LAB;  Service: Cardiovascular;  Laterality: N/A;   TONSILECTOMY/ADENOIDECTOMY WITH MYRINGOTOMY     Patient Active Problem List   Diagnosis Date Noted   GAD (generalized anxiety disorder) 07/04/2018   PTSD (post-traumatic stress disorder) 07/04/2018   Stable angina 08/03/2017    ONSET DATE: Parkinson's dx'ed 2022, date of referral 08/01/2024  REFERRING DIAG: G20.A1 (ICD-10-CM) - Parkinson's disease (HCC)   THERAPY DIAG:  Dysarthria and anarthria  Dysphagia,  oropharyngeal phase  Rationale for Evaluation and Treatment Rehabilitation  SUBJECTIVE:   PERTINENT HISTORY: Pt is a 58 year old male with Parkinson's Disease, progressive medication wearing off time, esophageal dysphagia (most recent dilation 08/03/2024 - pt states some improvement noticed), decreased appetite with recent (08/01/2024) worsening symptoms of Parkinson's disease, including increased tremors, exceptional fatigue in his voice, significant swallowing difficulties with eating described as 'fatiguing' due to effort required to chew and move food in mouth, frequent choking episodes, poor nutrition due to fatigue associated with eating, weight loss despite attempts to increase calorie intake, Progressive dysphagia, jaw weakness, and fatigue are more severe than typical for Parkinson's disease at this stage. Differential diagnosis includes neuromuscular disorders such as myasthenia gravis and ALS.  DIAGNOSTIC FINDINGS:    EGD on 08/03/2024  Abnormal motility was noted in esophagus. The cricopharyngeus was normal. There is spasticity of the esophageal body. The distal esophagus/lower esophageal sphincter is open. No endoscopic abnormality was evident in the esophagus to explain the patient's complaint of dysphagia. It was decided, however, to proceed with dilation of the entire esophagus. Dilation was performed with a Maloney dilator wiht no resistance at 54 Fr. abnormal esophageal motility, suspicious for esophageal spasm.   EMG at [facility] scheduled for 08/31/2024  PAIN:  Are you having pain? No  FALLS: Has patient fallen in last 6 months?  No  LIVING ENVIRONMENT: Lives with: lives with their family Lives in: House/apartment  PLOF:  Level of assistance: Independent with ADLs, Independent with IADLs Employment: Full-time employment  PATIENT GOALS    to improve hypophonia and reduce choking when consuming  PO items  SUBJECTIVE STATEMENT: Pt pleasant, eager  Pt accompanied by:  self  OBJECTIVE:  TODAY'S TREATMENT:   Skilled treatment session focused on pt's communication and dysphagia goals. SLP facilitated session by providing the following interventions:  Pt continues to present with decreased vocal intensity and states that he is unable to participate in graduation ceremony this evening d/t severe inability to achieve and sustain phonation.   He reports continued effort when swallowing to get food to move down beyond his [place] (location that he points to). Use of liquid wash is intermittently effective. No further recommendations at this time, Modified Barium Swallow Study is scheduled for Thursday, December 11th at 10:15am with this clinical research associate.   PT referral received with PT evaluation scheduled on December 18th. Pt reports increased fatigue when walking d/t progressive dragging of his right foot/leg. Further reports catching his toe/foot on the carpet with 2 near falls. He states, that pivoting to turn or change directions has become very effortful I have to stop and say to myself this (changing directions or pivoting) is a process.     PATIENT EDUCATION: Education details: see above Person educated: Patient Education method: Explanation Education comprehension: verbalized understanding and needs further education   HOME EXERCISE PROGRAM: Adhere to strict aspiration precautions   GOALS: Goals reviewed with patient? Yes  SHORT TERM GOALS: Target date: 10 sessions  During each recording session, the client will self-monitor or be coached to maintain a stable vocal intensity (volume) and articulation clarity for at least 50% of the recorded phrases, as measured by the clinician's review Baseline: Goal status: INITIAL  2.  The client will identify 5 personally meaningful phrases (e.g., I love you, Thank you, favorite jokes) and record them in their natural voice, so that these messages can be preserved in their synthetic voice or message  bank Baseline:  Goal status: INITIAL  3.  Patient will participate in objective swallowing evaluation (MBSS) to identify safest diet recommendation as well as therapeutic targets.  Baseline:  Goal status: INITIAL   LONG TERM GOALS: Target date: 10/27/2024  Pt will demonstrate understanding of multimodal methods of communication.  Baseline:  Goal status: INITIAL  2.  With Mod I, pt will demonstrate understanding of result and recommendations of MBSS by verbalizing salient points.  Baseline:  Goal status: INITIAL   ASSESSMENT:  CLINICAL IMPRESSION: Patient is a 58 y.o. male who was seen today for a voice and clinical swallow treatment d/t recent decline in speech and swallowing. Pt with history of Parkinson's Disease but now presenting with progressive atypical symptoms. Pt referred to [facility] for EMG to evaluate possible presence of additional neuromuscular disorders.   Pt reports recent significant steady global decline over the last several months: rapid decline in balance, right side weakness, voice and major swallow issues. While he reports getting choked on liquids and solids, he states chewing is the biggest problem, it is absolutely fatiguing. A couple of months ago, I had to have the himleck d/t exphixa when eating. Pt reports unintentional weight loss d/t fatigue with PO consumption, especially when consuming solids.   Dysphagia He reports some improvement since EGD but continues to experience extreme oral fatigue. During this evaluation, pt presents with severe oral phase and mild pharyngeal  phase dysphagia. When consuming thin liquids via cup, applesauce and graham crackers, pt's oral phase is c/b decreased lingual and mandibular movement, appearance of reduced bolus manipulation and effortful AP transfer of all boluses. Severity of impairment is observed more so with single bit of graham cracker. Pt with attempts to orally manipulate bolus for > 1 minutes with fistulations  observed in bilateral masseter muscles. He also describes pharyngeal sensation of effort to push it (bolus) down (his throat). It is better since yesterday (endoscopy) but still takes effort. Education provided on increasing consumption of protein shakes, soft meats such as tuna, yogurt. Will request order for a Modified Barium Swallow Study.   Dysarthria  Pt presents with severe dysarthria/dysphonia c/b severely reduced respiratory support, hoarse, breathy, raspy, strained vocal quality resulting in  subjective description of whispering. Pt with little audible phonation, inability to project his voice and significant vocal fatigue. Pt's articulatory movement is precise with no slurring observed.   Pt with continued progressive decline. PT referral received, evaluation scheduled and pt reports EMG moved to December 12th.  See the above treatment note for details.   OBJECTIVE IMPAIRMENTS include dysarthria, voice disorder, and dysphagia. These impairments are limiting patient from effectively communicating at home and in community and safety when swallowing. Factors affecting potential to achieve goals and functional outcome are medical prognosis, previous level of function, and severity of impairments. Patient will benefit from skilled SLP services to address above impairments and improve overall function.  REHAB POTENTIAL: Good  PLAN: SLP FREQUENCY: 1-2x/week  SLP DURATION: 12 weeks  PLANNED INTERVENTIONS: Aspiration precaution training, Diet toleration management , Trials of upgraded texture/liquids, Functional tasks, Multimodal communication approach, SLP instruction and feedback, Compensatory strategies, and Patient/family education    Guadalupe Kerekes B. Rubbie, M.S., CCC-SLP, Tree Surgeon Certified Brain Injury Specialist [facility]  [facility] Rehabilitation Services Office [phone] Ascom [phone] Fax [phone]

## 2024-08-23 NOTE — Therapy (Signed)
 OUTPATIENT OCCUPATIONAL THERAPY NEURO TREATMENT NOTE  Patient Name: Dakota Davis MRN: [id number] DOB:07/11/1966, 58 y.o., male Today's Date: 08/23/2024  PCP: Lenon Layman ORN, MD REFERRING PROVIDER: Onita Elspeth Sharper, MD  END OF SESSION:  OT End of Session - 08/23/24 1625     Visit Number 6    Number of Visits 12    Date for Recertification  10/12/24    OT Start Time 1615    OT Stop Time 1700    OT Time Calculation (min) 45 min    Activity Tolerance Patient tolerated treatment well    Behavior During Therapy [facility] for tasks assessed/performed          Past Medical History:  Diagnosis Date   Anxiety    Anxiety    Arthritis    Depression    Hyperlipidemia    Insomnia    Lymphadenopathy, retroperitoneal    OSA (obstructive sleep apnea)    Parkinson disease (HCC)    Sciatica    Seizures (HCC)    Spinal stenosis    Spinal stenosis    Spondylisthesis    Past Surgical History:  Procedure Laterality Date   BACK SURGERY     BACK SURGERY     10/31/2016,12/21/2015   CARDIAC CATHETERIZATION     ESOPHAGOGASTRODUODENOSCOPY N/A 11/27/2021   Procedure: ESOPHAGOGASTRODUODENOSCOPY (EGD);  Surgeon: Toledo, Ladell POUR, MD;  Location: [facility] ENDOSCOPY;  Service: Gastroenterology;  Laterality: N/A;   KNEE ARTHROSCOPY     2018   LEFT HEART CATH AND CORONARY ANGIOGRAPHY N/A 08/05/2017   Procedure: LEFT HEART CATH AND CORONARY ANGIOGRAPHY;  Surgeon: Hester Wolm PARAS, MD;  Location: [facility] INVASIVE CV LAB;  Service: Cardiovascular;  Laterality: N/A;   TONSILECTOMY/ADENOIDECTOMY WITH MYRINGOTOMY     Patient Active Problem List   Diagnosis Date Noted   GAD (generalized anxiety disorder) 07/04/2018   PTSD (post-traumatic stress disorder) 07/04/2018   Stable angina 08/03/2017    ONSET DATE: 09/2020  REFERRING DIAG: Parkinson's disease  THERAPY DIAG:  Muscle weakness (generalized)  Other lack of coordination  Rationale for Evaluation and Treatment:  Rehabilitation  SUBJECTIVE:   SUBJECTIVE STATEMENT:   Pt. reports that he is planning to travel to [facility] this weekend  Pt accompanied by: self  PERTINENT HISTORY: Patient is a 58 year old male with a diagnosis of Parkinson's disease.  Patient was referred to occupational therapy for limited fine motor coordination skills and bilateral hand tremors.  Patient's hand tremors started with the left  hand with a gradual progression to the right hand. [name].  Patient has a recent history of severe right wrist and hand pain and has a upcoming orthopedic consult early next week on 07/25/2024.  Patient reports having a history of a triple cervical fusion last year.  Past medical history includes OSA, major depressive disorder (current), and prediabetes.  PRECAUTIONS: None  WEIGHT BEARING RESTRICTIONS: No  PAIN:  Are you having pain?   No reports of pain  FALLS: Has patient fallen in last 6 months? No  LIVING ENVIRONMENT: Lives with: [name] with wife, [name], and youngest son Lives in: House/apartment Stairs:  two levels, 3 little steps from garage Has following equipment at home: None  PLOF: Independent  PATIENT GOALS:  To improve FMC   OBJECTIVE:  Note: Objective measures were completed at Evaluation unless otherwise noted.  HAND DOMINANCE: Right  ADLs:  Transfers/ambulation related to ADLs: Independent Eating: Increased time, difficulty at times cutting meat, Independent holding a  full drink-two hands initially 2/2 onset of right hand pain Grooming: Independent UB Dressing: Independent LB Dressing: Independent; occasionally has to redo tying shoes. 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 Leisure: Being outside, yard work, reading history research    MOBILITY STATUS: Independent  POSTURE COMMENTS:  Intact  ACTIVITY TOLERANCE: Activity tolerance: Depends on the task he is doing, typically approximately 30 min.  FUNCTIONAL OUTCOME MEASURES: MAM-20: 65/80  UPPER EXTREMITY ROM:  *History of left shoulder tears.  Active ROM Right Eval WFL Left Eval [facility]  Shoulder flexion    Shoulder abduction    Shoulder adduction    Shoulder extension    Shoulder internal rotation    Shoulder external rotation    Elbow flexion    Elbow extension    Wrist flexion    Wrist extension    Wrist ulnar deviation    Wrist radial deviation    Wrist pronation    Wrist supination    (Blank rows = not tested)  UPPER EXTREMITY MMT:     MMT Right eval Left eval  Shoulder flexion 5 5  Shoulder abduction 5 5  Shoulder adduction    Shoulder extension    Shoulder internal rotation    Shoulder external rotation    Middle trapezius    Lower trapezius    Elbow flexion 5 5  Elbow extension 5 5  Wrist flexion 4- 5  Wrist extension 4- 5  Wrist ulnar deviation    Wrist radial deviation    Wrist pronation 5 5  Wrist supination 5 5  (Blank rows = not tested)  HAND FUNCTION: Grip strength: Right: 94 lbs; Left: 112 lbs, Lateral pinch: Right: 38 lbs, Left: 37 lbs, and 3 point pinch: Right: 32 lbs, Left: 25 lbs with the Saehan Pinch Meter  Grip strength: Right: 110 lbs; Left: 121 lbs, Lateral pinch: Right: 24 lbs, Left: 21 lbs, and 3 point pinch: Right: 22 lbs, Left: 19 lbs with the Jamar Pinch Meter   COORDINATION: 9 Hole Peg test: Right: 28 sec; Left: 37 sec  SENSATION: Light touch: WFL Proprioception: WFL  EDEMA: Intact  MUSCLE TONE: WFL  COGNITION: Overall cognitive status:  Patient reports having [name] to concentrate while talking, as he loses his train of thoughts more frequently.  Patient reports concern about the the volume of his voice has changing. Patient also reports changes in moving food through his mouth in preparation for swallowing.  A a referral for speech therapy consult was requested  VISION: Patient reports planning to follow-up with his eye-care physician  TREATMENT DATE: 08/23/2024  Neuromuscular re-education:   -Facilitated bilateral [facility] skills repetitively flipping cards alternately thumb on fingers, and fingers on thumb movement patterns x's 2 trials. -Facilitated right [facility] focused on controlling movements needed to manipulate tweezers to grasp tiny cubes from a 1/2 x 2 jars, and stacking them at the tabletop surface. -Facilitated right [facility] skills manipulating tweezers with precision and stacking them into vertical tower.  -Facilitated right hand [facility] skills grasping 1/2 circular tipped pegs, storing them in the palm of his hand, followed by translatory movements moving them from the palm to the tip of the 2nd digit and  thumb in preparation for placing them into the pegboard. -Facilitated alternately right hand thumb opposition to the tip of the 2nd through 5th digits to remove the pegs. -Pt. education was provided about a HEP for [facility] skills.   PATIENT EDUCATION:  Education details: [facility] skills,  hand strengthening with blue theraputty Person educated: Patient Education method: Explanation, Demonstration, Tactile cues, and Verbal cues Education comprehension: verbalized understanding, returned demonstration, verbal cues required, tactile cues required, and needs further education  HOME EXERCISE PROGRAM:  -Hand strengthening with blue theraputty -Condition management: compensatory strategies for tremors stabilizing proximally for forearm support for distal control. -bilateral [facility] skills.   GOALS: Goals reviewed with patient?  Yes  SHORT TERM GOALS: Target date: 08/31/2024   Patient will be independent with HEPs for BUE functioning. Baseline: Eval: Current home exercise program Goal status: INITIAL  LONG TERM GOALS: Target date: 10/12/2024  Patient will independently and efficiently be able to cut meat. Baseline: Eval: Patient presents with difficulty cutting meat efficiently Goal status: INITIAL  2.  Patient will independently grasp, and manipulate multiple medication pills efficiently with his right hand Baseline: Eval: Pt. Has difficulty manipulating to pills at a time efficiently in his hand. Goal status: INITIAL  3.  Patient will independently and efficiently be able to clip nails Baseline: Eval: Patient scored 2-3/4 for cutting nails with a nail clipper portion on the Mam-20 Goal status: INITIAL  4.  Patient will independently and efficiently food on his plate Baseline: Eval: Patient has difficulty cutting meat. Goal status: INITIAL  5.  Patient will grasp 1 item efficiently out of a full pocket Baseline: Eval: Patient has difficulty grasping 1 item efficiently from multiple items in his pocket Goal status: INITIAL  6.  Patient will efficiently write a 3 sentence paragraph with 75% legibility and minimal micrographia  Baseline: Eval: Name only 50% legibility with moderate micrographia Goal status: INITIAL  ASSESSMENT:  CLINICAL IMPRESSION:  Pt. reports that they were able to move the EMG date up to Friday this week. Pt. reports having to leave his OT session early on Thursday for a swallow study test. Pt. Reports over the past couple of weeks that he has progressively had trouble typing his shoes tight enough, often having to retie them up to 6 times a day. Pt. reports that he has never had difficulty with it before the past few weeks. Pt. was able to control tweezer use to stack multiple towers of 6 mini cubes with the right hand. Pt. dropped multiple small objects from his hand when attempting  to perform translatory movements. Patient continues to benefit from OT services to work on improving hand function skills in order to maximize overall independence with ADLs and IADL tasks  PERFORMANCE DEFICITS: in functional skills including ADLs, IADLs, coordination, dexterity, ROM, strength, pain, Fine motor control, and Gross motor control, cognitive skills including attention and memory, and psychosocial skills including coping strategies, environmental adaptation, interpersonal interactions, and routines and behaviors.   IMPAIRMENTS: are limiting patient from ADLs, IADLs, and leisure.   CO-MORBIDITIES: may have co-morbidities  that affects occupational performance. Patient will benefit from skilled OT to address above impairments and improve overall function.  MODIFICATION OR ASSISTANCE TO COMPLETE EVALUATION: Min-Moderate modification of tasks or assist with assess necessary to complete an evaluation.  OT OCCUPATIONAL PROFILE AND HISTORY: Detailed assessment: Review of records and additional review of physical, cognitive, psychosocial history related to current functional performance.  CLINICAL DECISION MAKING: Moderate - several treatment options, min-mod task modification  necessary  REHAB POTENTIAL: Good  EVALUATION COMPLEXITY: Moderate    PLAN:  OT FREQUENCY: 1x a week  OT DURATION: 12 weeks  PLANNED INTERVENTIONS: 97168 OT Re-evaluation, 97535 self care/ADL training, 02889 therapeutic exercise, 97530 therapeutic activity, 97112 neuromuscular re-education, 97140 manual therapy, 97018 paraffin, 02989 moist heat, 97034 contrast bath, 97033 iontophoresis, 97129 Cognitive training (first 15 min), 02869 Cognitive training(each additional 15 min), 02239 Orthotic Initial, 97763 Orthotic/Prosthetic subsequent, energy conservation, coping strategies training, patient/family education, and DME and/or AE instructions  RECOMMENDED OTHER SERVICES: ST  CONSULTED AND AGREED WITH PLAN OF  CARE: Patient  PLAN FOR NEXT SESSION: Treatment  Richardson Otter, MS, OTR/L  08/23/2024, 4:28 PM

## 2024-08-25 ENCOUNTER — Ambulatory Visit: Admitting: Speech Pathology

## 2024-08-25 ENCOUNTER — Ambulatory Visit: Payer: Self-pay | Admitting: Occupational Therapy

## 2024-08-25 ENCOUNTER — Ambulatory Visit
Admission: RE | Admit: 2024-08-25 | Discharge: 2024-08-25 | Disposition: A | Discharge status: Home / Self Care | Source: Ambulatory Visit | Attending: Gastroenterology | Admitting: Gastroenterology

## 2024-08-25 DIAGNOSIS — M6281 Muscle weakness (generalized): Secondary | ICD-10-CM

## 2024-08-25 DIAGNOSIS — R278 Other lack of coordination: Secondary | ICD-10-CM

## 2024-08-25 DIAGNOSIS — G20A1 Parkinson's disease without dyskinesia, without mention of fluctuations: Secondary | ICD-10-CM | POA: Insufficient documentation

## 2024-08-25 DIAGNOSIS — R1312 Dysphagia, oropharyngeal phase: Secondary | ICD-10-CM | POA: Insufficient documentation

## 2024-08-25 DIAGNOSIS — R1319 Other dysphagia: Secondary | ICD-10-CM | POA: Insufficient documentation

## 2024-08-25 NOTE — Procedures (Signed)
 Modified Barium Swallow Study  Patient Details  Name: Dakota Davis MRN: [id number] Date of Birth: 29-Jun-1966  Today's Date: 08/25/2024  Modified Barium Swallow completed.  Full report located under Chart Review in the Imaging Section.  History of Present Illness Pt is a 58 year old male with Parkinson's Disease, progressive medication wearing off time, esophageal dysphagia (most recent dilation 08/03/2024 - pt states some improvement noticed), decreased appetite with recent (08/01/2024) worsening symptoms of Parkinson's disease, including increased tremors, exceptional fatigue in his voice, significant swallowing difficulties with eating described as 'fatiguing' due to effort required to chew and move food in mouth, frequent choking episodes, poor nutrition due to fatigue associated with eating, weight loss despite attempts to increase calorie intake, Progressive dysphagia, jaw weakness, and fatigue are more severe than typical for Parkinson's disease at this stage. Differential diagnosis includes neuromuscular disorders such as myasthenia gravis and ALS.  Pt's recent labs were negative for myasthenia gravis. EMG scheduled at [facility] on 08/26/2024.    Clinical Impression  Pt presents with continued dysphonia c/b breathy vocal quality and reduced vocal intensity.   Pt with moderate oral phase and mild pharyngeal phase dysphagia when consuming thin liquids, nectar thick liquids, puree, graham cracker with barium paste, whole barium tablet with thin liquid and diced peaches. His risk of aspiration when consuming this items is considered low.    Pt's oral phase is decreased lingual manipulation of bolus, reduced mandibular movement, vertical movements only (no rotary movement of mandible observed), decreased AP movement of solids to molars for masticate (solid boluses were kept anteriorly). Rocking of solid boluses was observed both anteriorly and laterally as well as piecemeal movement of each  bolus requiring 3+ swallow per single bolus. Difficulty orally manipulating barium tablet required significant amounts of liquid wash. This results in extended oral phase with solids and subjective increased fatigue was reported by pt. While pt appeared to have less oral difficulty when consuming thin liquids, he is observed utilizing a slight chin lift (suspect to help with AP transfer of bolus). Trace oral residue present throughout.   Pt's pharyngeal phase is c/b swallow initiation at the level of the pyriform sinuses with reduced anterior hyoid movement resulting in mildly decreased epiglottic deflection. Pt with trace pharyngeal residue (base of tongue, vallecula and pyriform sinuses) which prompted a volitional second swallow. Pt with great airway protection throughout the study.   Significant throat clearing followed consumption of solids with pt reporting significant globus sensation at the level of the thyroid notch. When consuming puree in the AP view, there appeared to be some stasis of bolus throughout the esophagus. A radiologist was not present to confirm. Will reach out to pt's GI will the above information.   Factors that may increase risk of adverse event in presence of aspiration Noe [employer] Lianne 2021):  (fatigue during PO consumpton)  Swallow Evaluation Recommendations Recommendations: PO diet PO Diet Recommendation: Regular;Dysphagia 2 (Finely chopped);Thin liquids (Level 0) Liquid Administration via: Cup Medication Administration: Whole meds with liquid Supervision: Patient able to self-feed Swallowing strategies  : Minimize environmental distractions;Slow rate;Small bites/sips Postural changes: Position pt fully upright for meals;Stay upright 30-60 min after meals Oral care recommendations: Oral care BID (2x/day) Recommended consults:  (already followed by GI)    Ferrah Panagopoulos B. Rubbie, M.S., CCC-SLP, Tree Surgeon Certified Brain Injury Specialist [facility]  [facility] Rehabilitation Services Office [phone] Ascom [phone] Fax [phone]

## 2024-08-25 NOTE — Therapy (Addendum)
 OUTPATIENT OCCUPATIONAL THERAPY NEURO TREATMENT NOTE  Patient Name: Dakota Davis MRN: [id number] DOB:[birth year], 58 y.o., male Today's Date: 08/25/2024  PCP: Lenon Layman ORN, MD REFERRING PROVIDER: Onita Elspeth Sharper, MD  END OF SESSION:  OT End of Session - 08/25/24 0941     Visit Number 7    Number of Visits 12    Date for Recertification  10/12/24    OT Start Time 0935    OT Stop Time 0959    OT Time Calculation (min) 24 min    Activity Tolerance Patient tolerated treatment well    Behavior During Therapy [facility] for tasks assessed/performed          Past Medical History:  Diagnosis Date   Anxiety    Anxiety    Arthritis    Depression    Hyperlipidemia    Insomnia    Lymphadenopathy, retroperitoneal    OSA (obstructive sleep apnea)    Parkinson disease (HCC)    Sciatica    Seizures (HCC)    Spinal stenosis    Spinal stenosis    Spondylisthesis    Past Surgical History:  Procedure Laterality Date   BACK SURGERY     BACK SURGERY     10/31/2016,12/21/2015   CARDIAC CATHETERIZATION     ESOPHAGOGASTRODUODENOSCOPY N/A 11/27/2021   Procedure: ESOPHAGOGASTRODUODENOSCOPY (EGD);  Surgeon: Toledo, Ladell POUR, MD;  Location: [facility] ENDOSCOPY;  Service: Gastroenterology;  Laterality: N/A;   KNEE ARTHROSCOPY     2018   LEFT HEART CATH AND CORONARY ANGIOGRAPHY N/A 08/05/2017   Procedure: LEFT HEART CATH AND CORONARY ANGIOGRAPHY;  Surgeon: Hester Wolm PARAS, MD;  Location: [facility] INVASIVE CV LAB;  Service: Cardiovascular;  Laterality: N/A;   TONSILECTOMY/ADENOIDECTOMY WITH MYRINGOTOMY     Patient Active Problem List   Diagnosis Date Noted   GAD (generalized anxiety disorder) 07/04/2018   PTSD (post-traumatic stress disorder) 07/04/2018   Stable angina 08/03/2017    ONSET DATE: 09/2020  REFERRING DIAG: Parkinson's disease  THERAPY DIAG:  Muscle weakness (generalized)  Other lack of coordination  Rationale for Evaluation and Treatment:  Rehabilitation  SUBJECTIVE:   SUBJECTIVE STATEMENT:   Pt. reports that he is traveling to [place]  for Christmas, and needs to cancel his appointments on the 23rd, and 24th of the month.  Pt accompanied by: self  PERTINENT HISTORY: Patient is a 58 year old male with a diagnosis of Parkinson's disease.  Patient was referred to occupational therapy for limited fine motor coordination skills and bilateral hand tremors.  Patient's hand tremors started with the left  hand with a gradual progression to the right hand. [name].  Patient has a recent history of severe right wrist and hand pain and has a upcoming orthopedic consult early next week on 07/25/2024.  Patient reports having a history of a triple cervical fusion last year.  Past medical history includes OSA, major depressive disorder (current), and prediabetes.  PRECAUTIONS: None  WEIGHT BEARING RESTRICTIONS: No  PAIN:  Are you having pain?   No reports of pain  FALLS: Has patient fallen in last 6 months? No  LIVING ENVIRONMENT: Lives with: [name] with wife, [name], and youngest son Lives in: House/apartment Stairs:  two levels, 3 little steps from garage Has following equipment at home: None  PLOF: Independent  PATIENT GOALS:  To improve FMC   OBJECTIVE:  Note: Objective measures were completed at Evaluation unless otherwise noted.  HAND DOMINANCE: Right  ADLs:  Transfers/ambulation related to ADLs: Independent  Eating: Increased time, difficulty at times cutting meat, Independent holding a full drink-two hands initially 2/2 onset of right hand pain Grooming: Independent UB Dressing: Independent LB Dressing: Independent; occasionally has to redo tying shoes. 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 Leisure: Being outside, yard work, reading history research    MOBILITY STATUS: Independent  POSTURE COMMENTS:  Intact  ACTIVITY TOLERANCE: Activity tolerance: Depends on the task he is doing, typically approximately 30 min.  FUNCTIONAL OUTCOME MEASURES: MAM-20: 65/80  UPPER EXTREMITY ROM:  *History of left shoulder tears.  Active ROM Right Eval WFL Left Eval [facility]  Shoulder flexion    Shoulder abduction    Shoulder adduction    Shoulder extension    Shoulder internal rotation    Shoulder external rotation    Elbow flexion    Elbow extension    Wrist flexion    Wrist extension    Wrist ulnar deviation    Wrist radial deviation    Wrist pronation    Wrist supination    (Blank rows = not tested)  UPPER EXTREMITY MMT:     MMT Right eval Left eval  Shoulder flexion 5 5  Shoulder abduction 5 5  Shoulder adduction    Shoulder extension    Shoulder internal rotation    Shoulder external rotation    Middle trapezius    Lower trapezius    Elbow flexion 5 5  Elbow extension 5 5  Wrist flexion 4- 5  Wrist extension 4- 5  Wrist ulnar deviation    Wrist radial deviation    Wrist pronation 5 5  Wrist supination 5 5  (Blank rows = not tested)  HAND FUNCTION: Grip strength: Right: 94 lbs; Left: 112 lbs, Lateral pinch: Right: 38 lbs, Left: 37 lbs, and 3 point pinch: Right: 32 lbs, Left: 25 lbs with the Saehan Pinch Meter  Grip strength: Right: 110 lbs; Left: 121 lbs, Lateral pinch: Right: 24 lbs, Left: 21 lbs, and 3 point pinch: Right: 22 lbs, Left: 19 lbs with the Jamar Pinch Meter   COORDINATION: 9 Hole Peg test: Right: 28 sec; Left: 37 sec  SENSATION: Light touch: WFL Proprioception: WFL  EDEMA:  Intact  MUSCLE TONE: WFL  COGNITION: Overall cognitive status: Patient reports having [name] to concentrate while talking, as he loses his train of thoughts more frequently.  Patient reports concern about the the volume of his voice has changing. Patient also reports changes in moving food through his mouth in preparation for swallowing.  A a referral for speech therapy consult was requested  VISION: Patient reports planning to follow-up with his eye-care physician  TREATMENT DATE: 08/25/2024  Therapeutic Activities:   -Facilitated bilateral hand FMC skills grasping, and flipping [place]  style discs. Pt. focused on trials of flipping the discs with each hand individually, then followed by bilateral UE simultaneous alternating hand movements while flipping them in both vertical and horizontal planes. -Facilitated storing up to 4 [place]  discs with each hand.  PATIENT EDUCATION:  Education details: [facility] skills,  hand strengthening with blue theraputty Person educated: Patient Education method: Explanation, Demonstration, Tactile cues, and Verbal cues Education comprehension: verbalized understanding, returned demonstration, verbal cues required, tactile  cues required, and needs further education  HOME EXERCISE PROGRAM:  -Hand strengthening with blue theraputty -Condition management: compensatory strategies for tremors stabilizing proximally for forearm support for distal control. -bilateral [facility] skills.   GOALS: Goals reviewed with patient? Yes  SHORT TERM GOALS: Target date: 08/31/2024   Patient will be independent with HEPs for BUE functioning. Baseline: Eval: Current home exercise program Goal status: INITIAL  LONG TERM GOALS: Target date: 10/12/2024  Patient will independently and efficiently be able to cut meat. Baseline: Eval: Patient presents with difficulty cutting meat efficiently Goal status: INITIAL  2.  Patient will independently grasp, and manipulate  multiple medication pills efficiently with his right hand Baseline: Eval: Pt. Has difficulty manipulating to pills at a time efficiently in his hand. Goal status: INITIAL  3.  Patient will independently and efficiently be able to clip nails Baseline: Eval: Patient scored 2-3/4 for cutting nails with a nail clipper portion on the Mam-20 Goal status: INITIAL  4.  Patient will independently and efficiently food on his plate Baseline: Eval: Patient has difficulty cutting meat. Goal status: INITIAL  5.  Patient will grasp 1 item efficiently out of a full pocket Baseline: Eval: Patient has difficulty grasping 1 item efficiently from multiple items in his pocket Goal status: INITIAL  6.  Patient will efficiently write a 3 sentence paragraph with 75% legibility and minimal micrographia  Baseline: Eval: Name only 50% legibility with moderate micrographia Goal status: INITIAL  ASSESSMENT:  CLINICAL IMPRESSION:  Pt.'s treatment session was shortened this morning 2/2  having had an urgent modified swallow test scheduled. Pt. required increased time to complete flipping the [place]  discs with bilateral alternating hand movements in the vertical plane at the tabletop. Pt. required increased time, and verbal cues for restarting the place with both the right, and left hands during the task. Patient continues to benefit from OT services to work on improving hand function skills in order to maximize overall independence with ADLs and IADL tasks  PERFORMANCE DEFICITS: in functional skills including ADLs, IADLs, coordination, dexterity, ROM, strength, pain, Fine motor control, and Gross motor control, cognitive skills including attention and memory, and psychosocial skills including coping strategies, environmental adaptation, interpersonal interactions, and routines and behaviors.   IMPAIRMENTS: are limiting patient from ADLs, IADLs, and leisure.   CO-MORBIDITIES: may have co-morbidities  that affects  occupational performance. Patient will benefit from skilled OT to address above impairments and improve overall function.  MODIFICATION OR ASSISTANCE TO COMPLETE EVALUATION: Min-Moderate modification of tasks or assist with assess necessary to complete an evaluation.  OT OCCUPATIONAL PROFILE AND HISTORY: Detailed assessment: Review of records and additional review of physical, cognitive, psychosocial history related to current functional performance.  CLINICAL DECISION MAKING: Moderate - several treatment options, min-mod task modification necessary  REHAB POTENTIAL: Good  EVALUATION COMPLEXITY: Moderate    PLAN:  OT FREQUENCY: 1x a week  OT DURATION: 12 weeks  PLANNED INTERVENTIONS: 97168 OT Re-evaluation, 97535 self care/ADL training, 02889 therapeutic exercise, 97530 therapeutic activity, 97112 neuromuscular re-education, 97140 manual therapy, 97018 paraffin, 02989 moist heat, 97034 contrast bath, 97033 iontophoresis, 97129 Cognitive training (first 15 min), 02869 Cognitive training(each additional 15 min), 02239 Orthotic Initial, 97763 Orthotic/Prosthetic subsequent, energy conservation, coping strategies training, patient/family education, and DME and/or AE instructions  RECOMMENDED OTHER SERVICES: ST  CONSULTED AND AGREED WITH PLAN OF CARE: Patient  PLAN FOR NEXT SESSION: Treatment  Richardson Otter, MS, OTR/L  08/25/2024, 10:10 AM

## 2024-08-30 ENCOUNTER — Ambulatory Visit: Admitting: Speech Pathology

## 2024-08-30 ENCOUNTER — Ambulatory Visit: Payer: Self-pay | Admitting: Occupational Therapy

## 2024-09-01 ENCOUNTER — Ambulatory Visit: Payer: Self-pay | Admitting: Occupational Therapy

## 2024-09-01 ENCOUNTER — Ambulatory Visit: Admitting: Speech Pathology

## 2024-09-01 ENCOUNTER — Ambulatory Visit: Admitting: Physical Therapy

## 2024-09-01 DIAGNOSIS — R2681 Unsteadiness on feet: Secondary | ICD-10-CM

## 2024-09-01 DIAGNOSIS — R471 Dysarthria and anarthria: Secondary | ICD-10-CM

## 2024-09-01 DIAGNOSIS — R278 Other lack of coordination: Secondary | ICD-10-CM

## 2024-09-01 DIAGNOSIS — R1312 Dysphagia, oropharyngeal phase: Secondary | ICD-10-CM

## 2024-09-01 DIAGNOSIS — M6281 Muscle weakness (generalized): Secondary | ICD-10-CM

## 2024-09-01 DIAGNOSIS — R262 Difficulty in walking, not elsewhere classified: Secondary | ICD-10-CM

## 2024-09-01 NOTE — Therapy (Unsigned)
 OUTPATIENT SPEECH LANGUAGE PATHOLOGY PARKINSON'S TREATMENT   Patient Name: Dakota Davis MRN: [id number] DOB:10-Jan-1966, 58 y.o., male Today's Date: 09/01/2024   PCP: Layman Piety, MD REFERRING PROVIDER: Layman Piety, MD   End of Session - 08/09/24 0859     Visit Number 2    Number of Visits 25    Date for Recertification  10/27/24    Authorization Type [employer]    Progress Note Due on Visit 10    SLP Start Time 1530    SLP Stop Time  1615    SLP Time Calculation (min) 45 min    Activity Tolerance Patient tolerated treatment well            Past Medical History:  Diagnosis Date   Anxiety    Anxiety    Arthritis    Depression    Hyperlipidemia    Insomnia    Lymphadenopathy, retroperitoneal    OSA (obstructive sleep apnea)    Parkinson disease (HCC)    Sciatica    Seizures (HCC)    Spinal stenosis    Spinal stenosis    Spondylisthesis    Past Surgical History:  Procedure Laterality Date   BACK SURGERY     BACK SURGERY     10/31/2016,12/21/2015   CARDIAC CATHETERIZATION     ESOPHAGOGASTRODUODENOSCOPY N/A 11/27/2021   Procedure: ESOPHAGOGASTRODUODENOSCOPY (EGD);  Surgeon: Toledo, Ladell POUR, MD;  Location: [facility] ENDOSCOPY;  Service: Gastroenterology;  Laterality: N/A;   KNEE ARTHROSCOPY     2018   LEFT HEART CATH AND CORONARY ANGIOGRAPHY N/A 08/05/2017   Procedure: LEFT HEART CATH AND CORONARY ANGIOGRAPHY;  Surgeon: Hester Wolm PARAS, MD;  Location: [facility] INVASIVE CV LAB;  Service: Cardiovascular;  Laterality: N/A;   TONSILECTOMY/ADENOIDECTOMY WITH MYRINGOTOMY     Patient Active Problem List   Diagnosis Date Noted   GAD (generalized anxiety disorder) 07/04/2018   PTSD (post-traumatic stress disorder) 07/04/2018   Stable angina 08/03/2017    ONSET DATE: Parkinson's dx'ed 2022, date of referral 08/01/2024  REFERRING DIAG: G20.A1 (ICD-10-CM) - Parkinson's disease (HCC)   THERAPY DIAG:  Dysarthria and anarthria  Dysphagia,  oropharyngeal phase  Rationale for Evaluation and Treatment Rehabilitation  SUBJECTIVE:   PERTINENT HISTORY: Pt is a 58 year old male with Parkinson's Disease, progressive medication wearing off time, esophageal dysphagia (most recent dilation 08/03/2024 - pt states some improvement noticed), decreased appetite with recent (08/01/2024) worsening symptoms of Parkinson's disease, including increased tremors, exceptional fatigue in his voice, significant swallowing difficulties with eating described as 'fatiguing' due to effort required to chew and move food in mouth, frequent choking episodes, poor nutrition due to fatigue associated with eating, weight loss despite attempts to increase calorie intake, Progressive dysphagia, jaw weakness, and fatigue are more severe than typical for Parkinson's disease at this stage. Differential diagnosis includes neuromuscular disorders such as myasthenia gravis and ALS.  DIAGNOSTIC FINDINGS:    EGD on 08/03/2024  Abnormal motility was noted in esophagus. The cricopharyngeus was normal. There is spasticity of the esophageal body. The distal esophagus/lower esophageal sphincter is open. No endoscopic abnormality was evident in the esophagus to explain the patient's complaint of dysphagia. It was decided, however, to proceed with dilation of the entire esophagus. Dilation was performed with a Maloney dilator wiht no resistance at 54 Fr. abnormal esophageal motility, suspicious for esophageal spasm.   EMG at [facility] scheduled for 08/31/2024  PAIN:  Are you having pain? No  FALLS: Has patient fallen in last 6 months?  No  LIVING ENVIRONMENT: Lives with: lives with their family Lives in: House/apartment  PLOF:  Level of assistance: Independent with ADLs, Independent with IADLs Employment: Full-time employment  PATIENT GOALS    to improve hypophonia and reduce choking when consuming  PO items  SUBJECTIVE STATEMENT: Pt pleasant, eager  Pt accompanied by:  self  OBJECTIVE:  TODAY'S TREATMENT:   Skilled treatment session focused on pt's communication and dysphagia goals. SLP facilitated session by providing the following interventions:  Pt continues to present with decreased vocal intensity and states that he is unable to participate in graduation ceremony this evening d/t severe inability to achieve and sustain phonation.   He reports continued effort when swallowing to get food to move down beyond his [place] (location that he points to). Use of liquid wash is intermittently effective. No further recommendations at this time, Modified Barium Swallow Study is scheduled for Thursday, December 11th at 10:15am with this clinical research associate.   PT referral received with PT evaluation scheduled on December 18th. Pt reports increased fatigue when walking d/t progressive dragging of his right foot/leg. Further reports catching his toe/foot on the carpet with 2 near falls. He states, that pivoting to turn or change directions has become very effortful I have to stop and say to myself this (changing directions or pivoting) is a process.     PATIENT EDUCATION: Education details: see above Person educated: Patient Education method: Explanation Education comprehension: verbalized understanding and needs further education   HOME EXERCISE PROGRAM: Adhere to strict aspiration precautions   GOALS: Goals reviewed with patient? Yes  SHORT TERM GOALS: Target date: 10 sessions  During each recording session, the client will self-monitor or be coached to maintain a stable vocal intensity (volume) and articulation clarity for at least 50% of the recorded phrases, as measured by the clinicians review Baseline: Goal status: INITIAL  2.  The client will identify 5 personally meaningful phrases (e.g., I love you, Thank you, favorite jokes) and record them in their natural voice, so that these messages can be preserved in their synthetic voice or message  bank Baseline:  Goal status: INITIAL  3.  Patient will participate in objective swallowing evaluation (MBSS) to identify safest diet recommendation as well as therapeutic targets.  Baseline:  Goal status: INITIAL   LONG TERM GOALS: Target date: 10/27/2024  Pt will demonstrate understanding of multimodal methods of communication.  Baseline:  Goal status: INITIAL  2.  With Mod I, pt will demonstrate understanding of result and recommendations of MBSS by verbalizing salient points.  Baseline:  Goal status: INITIAL   ASSESSMENT:  CLINICAL IMPRESSION: Patient is a 58 y.o. male who was seen today for a voice and clinical swallow treatment d/t recent decline in speech and swallowing. Pt with history of Parkinson's Disease but now presenting with progressive atypical symptoms. Pt referred to [facility] for EMG to evaluate possible presence of additional neuromuscular disorders.   Pt reports recent significant steady global decline over the last several months: rapid decline in balance, right side weakness, voice and major swallow issues. While he reports getting choked on liquids and solids, he states chewing is the biggest problem, it is absolutely fatiguing. A couple of months ago, I had to have the himleck d/t exphixa when eating. Pt reports unintentional weight loss d/t fatigue with PO consumption, especially when consuming solids.   Dysphagia He reports some improvement since EGD but continues to experience extreme oral fatigue. During this evaluation, pt presents with severe oral phase and mild pharyngeal  phase dysphagia. When consuming thin liquids via cup, applesauce and graham crackers, pt's oral phase is c/b decreased lingual and mandibular movement, appearance of reduced bolus manipulation and effortful AP transfer of all boluses. Severity of impairment is observed more so with single bit of graham cracker. Pt with attempts to orally manipulate bolus for > 1 minutes with fistulations  observed in bilateral masseter muscles. He also describes pharyngeal sensation of effort to push it (bolus) down (his throat). It is better since yesterday (endoscopy) but still takes effort. Education provided on increasing consumption of protein shakes, soft meats such as tuna, yogurt. Will request order for a Modified Barium Swallow Study.   Dysarthria  Pt presents with severe dysarthria/dysphonia c/b severely reduced respiratory support, hoarse, breathy, raspy, strained vocal quality resulting in  subjective description of whispering. Pt with little audible phonation, inability to project his voice and significant vocal fatigue. Pt's articulatory movement is precise with no slurring observed.   Pt with continued progressive decline. PT referral received, evaluation scheduled and pt reports EMG moved to December 12th.  See the above treatment note for details.   OBJECTIVE IMPAIRMENTS include dysarthria, voice disorder, and dysphagia. These impairments are limiting patient from effectively communicating at home and in community and safety when swallowing. Factors affecting potential to achieve goals and functional outcome are medical prognosis, previous level of function, and severity of impairments. Patient will benefit from skilled SLP services to address above impairments and improve overall function.  REHAB POTENTIAL: Good  PLAN: SLP FREQUENCY: 1-2x/week  SLP DURATION: 12 weeks  PLANNED INTERVENTIONS: Aspiration precaution training, Diet toleration management , Trials of upgraded texture/liquids, Functional tasks, Multimodal communication approach, SLP instruction and feedback, Compensatory strategies, and Patient/family education    Kaelani Kendrick B. Rubbie, M.S., CCC-SLP, Tree Surgeon Certified Brain Injury Specialist [facility]  [facility] Rehabilitation Services Office [phone] Ascom [phone] Fax [phone]

## 2024-09-01 NOTE — Therapy (Signed)
 OUTPATIENT PHYSICAL THERAPY NEURO EVALUATION   Patient Name: Dakota Davis MRN: [id number] DOB:[birth year], 58 y.o., male Today's Date: 09/01/2024   PCP: Lenon Layman ORN, MD  REFERRING PROVIDER: Maree Jannett POUR, MD   END OF SESSION:  PT End of Session - 09/01/24 1612     Visit Number 1    Number of Visits 24    PT Start Time 1615    PT Stop Time 1700    PT Time Calculation (min) 45 min    Equipment Utilized During Treatment Gait belt    Activity Tolerance Patient tolerated treatment well    Behavior During Therapy WFL for tasks assessed/performed          Past Medical History:  Diagnosis Date   Anxiety    Anxiety    Arthritis    Depression    Hyperlipidemia    Insomnia    Lymphadenopathy, retroperitoneal    OSA (obstructive sleep apnea)    Parkinson disease (HCC)    Sciatica    Seizures (HCC)    Spinal stenosis    Spinal stenosis    Spondylisthesis    Past Surgical History:  Procedure Laterality Date   BACK SURGERY     BACK SURGERY     10/31/2016,12/21/2015   CARDIAC CATHETERIZATION     ESOPHAGOGASTRODUODENOSCOPY N/A 11/27/2021   Procedure: ESOPHAGOGASTRODUODENOSCOPY (EGD);  Surgeon: Toledo, Ladell POUR, MD;  Location: [facility] ENDOSCOPY;  Service: Gastroenterology;  Laterality: N/A;   KNEE ARTHROSCOPY     2018   LEFT HEART CATH AND CORONARY ANGIOGRAPHY N/A 08/05/2017   Procedure: LEFT HEART CATH AND CORONARY ANGIOGRAPHY;  Surgeon: Hester Wolm PARAS, MD;  Location: [facility] INVASIVE CV LAB;  Service: Cardiovascular;  Laterality: N/A;   TONSILECTOMY/ADENOIDECTOMY WITH MYRINGOTOMY     Patient Active Problem List   Diagnosis Date Noted   GAD (generalized anxiety disorder) 07/04/2018   PTSD (post-traumatic stress disorder) 07/04/2018   Stable angina 08/03/2017    ONSET DATE: hx of PD. 06/03/2024   REFERRING DIAG: Parkinson's disease without dyskinesia, without mention of fluctuations   THERAPY DIAG:  Muscle weakness (generalized)  Other lack of  coordination  Difficulty in walking, not elsewhere classified  Unsteadiness on feet  Rationale for Evaluation and Treatment: Rehabilitation  SUBJECTIVE:                                                                                                                                                                                             SUBJECTIVE STATEMENT:  Pt is familiar to this clinic for PD management.  Pt reports that he was sitting in hospital waiting room in the  September of 2025. Reports that his foot was starting drag on the R side with increased foot drop and has then progressive weakness in the foot and ankle. Also reports that he has experienced significant cramping in the bil calves and sole of the Bil foot. Also also reported increased fasciculations in the calves intermittently and occasionally in the L shoulder with jumping sensation   Reports that he has also experiencing significant difficulty with phonation and breathing. Over the last several months his vocal quality has gotten progressively worse.  Pt reports that he will have follow-up appointment with MD on January 12th at the latest, but is hoping to get appointment moved forward.    After recent MD appointment for Gastroenterology; which lead to Urgent referral back to Neurology.    Pt accompanied by: self  PERTINENT HISTORY:  PD.  Seizures (HCC)  Spinal stenosis  Spinal stenosis  Spondylisthesis    PAIN:  Are you having pain? No  PRECAUTIONS: Fall  RED FLAGS: Hx of multiple lumbar surgeries.    WEIGHT BEARING RESTRICTIONS: No  FALLS: Has patient fallen in last 6 months? No  LIVING ENVIRONMENT: Lives with: lives with their spouse, lives with their son, and son is special needs  Lives in: House/apartment Stairs: Yes: Internal: 3 steps; none Has following equipment at home: walking stick uses intermittently    PLOF: Independent, Independent with basic ADLs, Independent with gait,  Independent with transfers, and is keeping walking stick available in car.    PATIENT GOALS: improve strength in RLE.   OBJECTIVE:  Note: Objective measures were completed at Evaluation unless otherwise noted.  DIAGNOSTIC FINDINGS:   EMG Conclusion: This is an abnormal study. 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 The chronic reinnervation findings in lumbar and in right C8 innervated muscles may reflect chronic right C8 and lumbar radiculopathies in light of the clinical history of degenerative spine disease. There is no robust electrodiagnostic evidence for widespread, progressive lower motor neuron disease. Correlation with spine imaging is recommended.    Face MRI IMPRESSION: No abnormality identified along the course of the trigeminal nerves.  CT   IMPRESSION: 1. Prior C4-C6 ACDF without evidence of hardware complication. No residual high-grade stenosis. 2. Unchanged multilevel cervical spondylosis as described above. Unchanged moderate to severe right neuroforaminal stenosis at C3-C4. Unchanged mild left neuroforaminal stenosis at C2-C3 and C3-C4. COGNITION: Overall cognitive status: Within functional limits for tasks assessed   SENSATION: Light touch: Impaired  Distal loss of light touch on the RLE,   COORDINATION: Decreased coordination on the RLE with ankle to knee   EDEMA:  Noted atrophy in the R lower leg.   MUSCLE TONE: RLE: reduced tone on the RLE    POSTURE: forward head and weight shift left  LOWER EXTREMITY MMT:     MMT  Right Eval Left Eval  Hip flexion 3+ 4  Hip extension    Hip abduction 4- 4  Hip adduction 4- 4  Hip internal rotation    Hip external rotation    Knee flexion 4- 5  Knee extension 4- 5  Ankle dorsiflexion 1 4-  Ankle plantarflexion 2 4  Ankle inversion    Ankle eversion 0 4-   (Blank rows = not tested)  LOWER EXTREMITY MMT:    MMT Right Eval  Left Eval  Hip flexion    Hip extension    Hip abduction    Hip adduction    Hip  internal rotation    Hip external rotation    Knee flexion    Knee extension    Ankle dorsiflexion Trace activation    Ankle plantarflexion    Ankle inversion    Ankle eversion No activation    (Blank rows = not tested)  BED MOBILITY:  Not tested  TRANSFERS: Sit to stand: Complete Independence  Assistive device utilized: None     Stand to sit: Complete Independence  Assistive device utilized: None      RAMP:  Not tested  CURB:  Not tested  STAIRS: Not tested GAIT: Findings: Gait Characteristics: vault on the RLLE , step through pattern, Right foot flat, and poor foot clearance- Right, Distance walked: 79ft, Assistive device utilized:None, Level of assistance: Complete Independence, and Comments: gait deviation as noted.   FUNCTIONAL TESTS:  5 times sit to stand: 13.21; noted lateral bias off of the RLE with sit<>stand. Timed up and go (TUG): TBD 10 meter walk test: 1.45m/s with foot drag intermittently on the RLE Berg Balance Scale:  Item Test date:  Date:  Date:   Sitting to standing  Insert SmartPhrase OPRCBERGREEVAL Insert SmartPhrase OPRCBERGREEVAL  2. Standing unsupported     3. Sitting with back unsupported, feet supported     4. Standing to sitting     5. Pivot transfer      6. Standing unsupported with eyes closed     7. Standing unsupported with feet together     8. Reaching forward with outstretched arms while standing     9. Pick up object from the floor from standing     10. Turning to look behind over left and right shoulders while standing     11. Turn 360 degrees     12. Place alternate foot on step or stool while standing unsupported     13. Standing unsupported one foot in front     14. Standing on one leg      Total Score /56 Total Score:    Total Score:     Dynamic Gait Index: Dynamic Gait Index  Mark the lowest level that applies.   Date Performed   Gait  level surface   2. Change in gait speed   3. Gait with horizontal head turns   4. Gait with vertical head turns   5. Gait and pivot turn   6. Step over obstacle   7. Step around obstacle   8. Steps   Total score     Score Interpretation: Score of <19 indicates high risk of falls.  Minimally Clinically Important Difference (MCID):  =DGI scores of<21/24 = 1.80 points DGI scores of >21/24 = 0.60 points   Montesano T, Inbar-Borovsky N, Brozgol M, Giladi N, [place] JM. The Dynamic Gait Index in healthy older adults: the role of stair climbing, fear of falling and gender. Gait Posture. 2009 Feb;29(2):237-41. doi: 10.1016/j.gaitpost.2008.08.013. Epub 2008 Oct 8. PMID: 81154560; PMCID: EFR7290501.  Pardasaney, MYRTIS LOIS Bonus, GEANNIE POUR., et al. (2012). Sensitivity to change and responsiveness of four balance measures for community-dwelling older adults. Physical therapy 92(3): 388-397.   PATIENT SURVEYS:  To be determined  TREATMENT DATE: 09/01/2024  Extensive hx taken and Evaluation completed.  Education on differential diagnosis and variation in treatment plan based on neurology assessment.    PATIENT EDUCATION: Education details: differential diagnosis for PD vs upper/lower motor neuron disease  Person educated: Patient Education method: Explanation Education comprehension: verbalized understanding  HOME EXERCISE PROGRAM: None provided on this day.   GOALS: Goals reviewed with patient? Yes   SHORT TERM GOALS: Target date: 09/29/2024    Patient will be independent in home exercise program to improve strength/mobility for better functional independence with ADLs. Baseline: to be given  Goal status: INITIAL   LONG TERM GOALS: Target date: 11/24/2024     2.  Patient (> 21 years old) will complete five times sit to stand test in < 11 seconds indicating  an increased LE strength and improved balance with symmetrical WB R and L . Baseline: 13.21 Goal status: INITIAL  3.  Patient will increase Berg Balance score by > 6 points to demonstrate decreased fall risk during functional activities Baseline: to be completed  Goal status: INITIAL  4.  Patient will increase 10 meter walk test to >1.41m/s as to improve gait speed for better community ambulation and to reduce fall risk with appropriate bracing to prevent foot drop. Baseline: 1.49m/s Goal status: INITIAL  5.  Patient will reduce timed up and go to <11 seconds to reduce fall risk and demonstrate improved transfer/gait ability without toe drag. Baseline: to be completed  Goal status: INITIAL  6.  Patient will increase FGA gait index score by 4 points as to demonstrate reduced fall risk and improved dynamic gait balance for better safety with community/home ambulation.   Baseline: to be completed  Goal status: INITIAL   ASSESSMENT:  CLINICAL IMPRESSION: Patient is a 58 y.o. male who was seen today for physical therapy evaluation and treatment for PD and new foot drop. Pt provided extensive hx of progression of s/s in the RLE/LLE, and occasionally the L shoulder. Noted to have significant fasciculations and crampsin BLE as well as marked weakness in RLE with atrophy and reported profound decline in vocal quality and oral strength over the past several months. Pt is noted to have foot drag on the RLE and significant weakness coordination deficits  in the RLE on assessment. Discussed differential diagnosis given systemic apearance of new s/s and instructed pt on potential benefits and limitations of PT for continued neuropathy treatment depend on upper vs lower motor neuron pathology. At this time, pt may benefit from PT to improve balance, strength and coordination or provide education on adaptations based on diagnosis from neurologist.    OBJECTIVE IMPAIRMENTS: Abnormal gait, cardiopulmonary  status limiting activity, decreased activity tolerance, decreased balance, decreased coordination, decreased endurance, decreased knowledge of condition, decreased knowledge of use of DME, decreased mobility, difficulty walking, decreased ROM, decreased strength, impaired sensation, impaired tone, and improper body mechanics.   ACTIVITY LIMITATIONS: squatting, stairs, locomotion level, and caring for others  PARTICIPATION LIMITATIONS: laundry, community activity, occupation, yard work, school, and church  PERSONAL FACTORS: Age, Fitness, Social background, Time since onset of injury/illness/exacerbation, and 1-2 comorbidities:   are also affecting patient's functional outcome.   REHAB POTENTIAL: Good  CLINICAL DECISION MAKING: Evolving/moderate complexity  EVALUATION COMPLEXITY: Moderate  PLAN:  PT FREQUENCY: 1-2x/week  PT DURATION: 12 weeks  PLANNED INTERVENTIONS: 97164- PT Re-evaluation, 97750- Physical Performance Testing, 97110-Therapeutic exercises, 97530- Therapeutic activity, [id number]- Neuromuscular re-education, 97535- Self Care, 02859- Manual therapy, [id number]- Gait training, [id number]- Orthotic Initial, [id number]- Prosthetic  Initial , G0283- Electrical stimulation (unattended), [phone]- Electrical stimulation (manual), Patient/Family education, Balance training, Stair training, Taping, Compression bandaging, DME instructions, and Wheelchair mobility training  PLAN FOR NEXT SESSION:   Complete outcome measures. Follow up on Neurology referral    Massie FORBES Dollar, PT 09/01/2024, 4:15 PM

## 2024-09-02 NOTE — Therapy (Signed)
 " OUTPATIENT OCCUPATIONAL THERAPY NEURO TREATMENT NOTE  Patient Name: Dakota Davis MRN: [id number] DOB:1966/03/30, 58 y.o., male Today's Date: 09/02/2024  PCP: Lenon Layman ORN, MD REFERRING PROVIDER: Onita Elspeth Sharper, MD  END OF SESSION:  OT End of Session - 09/02/24 0012     Visit Number 8    Number of Visits 12    Date for Recertification  10/12/24    OT Start Time 1456    OT Stop Time 1530    OT Time Calculation (min) 34 min    Activity Tolerance Patient tolerated treatment well    Behavior During Therapy [facility] for tasks assessed/performed          Past Medical History:  Diagnosis Date   Anxiety    Anxiety    Arthritis    Depression    Hyperlipidemia    Insomnia    Lymphadenopathy, retroperitoneal    OSA (obstructive sleep apnea)    Parkinson disease (HCC)    Sciatica    Seizures (HCC)    Spinal stenosis    Spinal stenosis    Spondylisthesis    Past Surgical History:  Procedure Laterality Date   BACK SURGERY     BACK SURGERY     10/31/2016,12/21/2015   CARDIAC CATHETERIZATION     ESOPHAGOGASTRODUODENOSCOPY N/A 11/27/2021   Procedure: ESOPHAGOGASTRODUODENOSCOPY (EGD);  Surgeon: Toledo, Ladell POUR, MD;  Location: [facility] ENDOSCOPY;  Service: Gastroenterology;  Laterality: N/A;   KNEE ARTHROSCOPY     2018   LEFT HEART CATH AND CORONARY ANGIOGRAPHY N/A 08/05/2017   Procedure: LEFT HEART CATH AND CORONARY ANGIOGRAPHY;  Surgeon: Hester Wolm PARAS, MD;  Location: [facility] INVASIVE CV LAB;  Service: Cardiovascular;  Laterality: N/A;   TONSILECTOMY/ADENOIDECTOMY WITH MYRINGOTOMY     Patient Active Problem List   Diagnosis Date Noted   GAD (generalized anxiety disorder) 07/04/2018   PTSD (post-traumatic stress disorder) 07/04/2018   Stable angina 08/03/2017    ONSET DATE: 09/2020  REFERRING DIAG: Parkinson's disease  THERAPY DIAG:  Muscle weakness (generalized)  Other lack of coordination  Rationale for Evaluation and Treatment:  Rehabilitation  SUBJECTIVE:   SUBJECTIVE STATEMENT:   Pt. reports that he will not be here next week 2/2 traveling for the holidays.  Pt accompanied by: self  PERTINENT HISTORY: Patient is a 58 year old male with a diagnosis of Parkinson's disease.  Patient was referred to occupational therapy for limited fine motor coordination skills and bilateral hand tremors.  Patient's hand tremors started with the left  hand with a gradual progression to the right hand. [name].  Patient has a recent history of severe right wrist and hand pain and has a upcoming orthopedic consult early next week on 07/25/2024.  Patient reports having a history of a triple cervical fusion last year.  Past medical history includes OSA, major depressive disorder (current), and prediabetes.  PRECAUTIONS: None  WEIGHT BEARING RESTRICTIONS: No  PAIN:  Are you having pain?   No reports of pain  FALLS: Has patient fallen in last 6 months? No  LIVING ENVIRONMENT: Lives with: [name] with wife, [name], and youngest son Lives in: House/apartment Stairs:  two levels, 3 little steps from garage Has following equipment at home: None  PLOF: Independent  PATIENT GOALS:  To improve FMC   OBJECTIVE:  Note: Objective measures were completed at Evaluation unless otherwise noted.  HAND DOMINANCE: Right  ADLs:  Transfers/ambulation related to ADLs: Independent Eating: Increased time, difficulty at times cutting meat,  Independent holding a full drink-two hands initially 2/2 onset of right hand pain Grooming: Independent UB Dressing: Independent LB Dressing: Independent; occasionally has to redo tying shoes. 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 Leisure: Being outside, yard work, reading history research    MOBILITY STATUS: Independent  POSTURE COMMENTS:  Intact  ACTIVITY TOLERANCE: Activity tolerance: Depends on the task he is doing, typically approximately 30 min.  FUNCTIONAL OUTCOME MEASURES: MAM-20: 65/80  UPPER EXTREMITY ROM:  *History of left shoulder tears.  Active ROM Right Eval WFL Left Eval [facility]  Shoulder flexion    Shoulder abduction    Shoulder adduction    Shoulder extension    Shoulder internal rotation    Shoulder external rotation    Elbow flexion    Elbow extension    Wrist flexion    Wrist extension    Wrist ulnar deviation    Wrist radial deviation    Wrist pronation    Wrist supination    (Blank rows = not tested)  UPPER EXTREMITY MMT:     MMT Right eval Left eval  Shoulder flexion 5 5  Shoulder abduction 5 5  Shoulder adduction    Shoulder extension    Shoulder internal rotation    Shoulder external rotation    Middle trapezius    Lower trapezius    Elbow flexion 5 5  Elbow extension 5 5  Wrist flexion 4- 5  Wrist extension 4- 5  Wrist ulnar deviation    Wrist radial deviation    Wrist pronation 5 5  Wrist supination 5 5  (Blank rows = not tested)  HAND FUNCTION: Grip strength: Right: 94 lbs; Left: 112 lbs, Lateral pinch: Right: 38 lbs, Left: 37 lbs, and 3 point pinch: Right: 32 lbs, Left: 25 lbs with the Saehan Pinch Meter  Grip strength: Right: 110 lbs; Left: 121 lbs, Lateral pinch: Right: 24 lbs, Left: 21 lbs, and 3 point pinch: Right: 22 lbs, Left: 19 lbs with the Jamar Pinch Meter   COORDINATION: 9 Hole Peg test: Right: 28 sec; Left: 37 sec  SENSATION: Light touch: WFL Proprioception: WFL  EDEMA: Intact  MUSCLE TONE: WFL  COGNITION: Overall cognitive status:  Patient reports having [name] to concentrate while talking, as he loses his train of thoughts more frequently.  Patient reports concern about the the volume of his voice has changing. Patient also reports changes in moving food through his mouth in preparation for swallowing.  A a referral for speech therapy consult was requested  VISION: Patient reports planning to follow-up with his eye-care physician  TREATMENT DATE: 08/25/2024  Therapeutic Activities:   -Facilitated [facility] skills using the Jamar Tweezer Dexterity Task using resistive tweezers to pick up 1 thin sticks from a horizontal position, and sustaining the grasp while preparing to place them into a vertical position with the pegboard placed at a flat tabletop surface. -The task was graded to increase the complexity of movement patterns with emphasis placed on sustaining grasp on the tweezers while extending the wrist to place the 1 pegs into the board placed at a vertical incline/angle.  -Facilitated [facility] skills focused on storing small objects in the radial aspect of  the hand, with simultaneous controlled dropping them from the ulnar aspect of the hand.    PATIENT EDUCATION:  Education details: [facility] skills,  hand strengthening with blue theraputty Person educated: Patient Education method: Explanation, Demonstration, Tactile cues, and Verbal cues Education comprehension: verbalized understanding, returned demonstration, verbal cues required, tactile cues required, and needs further education  HOME EXERCISE PROGRAM:  -Hand strengthening with blue theraputty -Condition management: compensatory strategies for tremors stabilizing proximally for forearm support for distal control. -bilateral [facility] skills.   GOALS: Goals reviewed with patient? Yes  SHORT TERM GOALS: Target date: 08/31/2024   Patient will be independent with HEPs for BUE functioning. Baseline: Eval: Current home exercise program Goal status: INITIAL  LONG  TERM GOALS: Target date: 10/12/2024  Patient will independently and efficiently be able to cut meat. Baseline: Eval: Patient presents with difficulty cutting meat efficiently Goal status: INITIAL  2.  Patient will independently grasp, and manipulate multiple medication pills efficiently with his right hand Baseline: Eval: Pt. Has difficulty manipulating to pills at a time efficiently in his hand. Goal status: INITIAL  3.  Patient will independently and efficiently be able to clip nails Baseline: Eval: Patient scored 2-3/4 for cutting nails with a nail clipper portion on the Mam-20 Goal status: INITIAL  4.  Patient will independently and efficiently food on his plate Baseline: Eval: Patient has difficulty cutting meat. Goal status: INITIAL  5.  Patient will grasp 1 item efficiently out of a full pocket Baseline: Eval: Patient has difficulty grasping 1 item efficiently from multiple items in his pocket Goal status: INITIAL  6.  Patient will efficiently write a 3 sentence paragraph with 75% legibility and minimal micrographia  Baseline: Eval: Name only 50% legibility with moderate micrographia Goal status: INITIAL  ASSESSMENT:  CLINICAL IMPRESSION:  Pt. was able to manipulate the tweezers with the right hand to grasp the thin 1 peg with the pegboard positioned flat at the tabletop surface. Pt. presented with more difficulty performing the task with the pegboard positioned at a vertical angle, while using the tweezers with his wrist in extension. Pt. Dropped multiple pegs from the tweezers in this position.  Pt. Was able to control hand movements while dropping the pegs from the ulnar aspect of his hand. Patient continues to benefit from OT services to work on improving hand function skills in order to maximize overall independence with ADLs and IADL tasks  PERFORMANCE DEFICITS: in functional skills including ADLs, IADLs, coordination, dexterity, ROM, strength, pain, Fine motor control,  and Gross motor control, cognitive skills including attention and memory, and psychosocial skills including coping strategies, environmental adaptation, interpersonal interactions, and routines and behaviors.   IMPAIRMENTS: are limiting patient from ADLs, IADLs, and leisure.   CO-MORBIDITIES: may have co-morbidities  that affects occupational performance. Patient will benefit from skilled OT to address above impairments and improve overall function.  MODIFICATION OR ASSISTANCE TO COMPLETE EVALUATION: Min-Moderate modification of tasks or assist with assess necessary to complete an evaluation.  OT OCCUPATIONAL PROFILE AND HISTORY: Detailed assessment: Review of records and additional review of physical, cognitive, psychosocial history related to current functional performance.  CLINICAL DECISION MAKING: Moderate - several treatment options, min-mod task modification necessary  REHAB POTENTIAL: Good  EVALUATION COMPLEXITY: Moderate    PLAN:  OT FREQUENCY: 1x a week  OT DURATION: 12 weeks  PLANNED INTERVENTIONS: 97168 OT Re-evaluation, 97535 self care/ADL training, 02889 therapeutic exercise, 97530 therapeutic activity, 97112 neuromuscular re-education, 97140 manual therapy, 97018 paraffin, 02989 moist heat, 97034 contrast  bath, [id number] iontophoresis, [id number] Cognitive training (first 15 min), 02869 Cognitive training(each additional 15 min), 02239 Orthotic Initial, 97763 Orthotic/Prosthetic subsequent, energy conservation, coping strategies training, patient/family education, and DME and/or AE instructions  RECOMMENDED OTHER SERVICES: ST  CONSULTED AND AGREED WITH PLAN OF CARE: Patient  PLAN FOR NEXT SESSION: Treatment  Annayah Worthley, MS, OTR/L  09/02/2024, 12:14 AM         "

## 2024-09-05 ENCOUNTER — Ambulatory Visit: Payer: Self-pay | Admitting: Occupational Therapy

## 2024-09-06 ENCOUNTER — Ambulatory Visit: Admitting: Speech Pathology

## 2024-09-06 ENCOUNTER — Ambulatory Visit: Admitting: Occupational Therapy

## 2024-09-07 ENCOUNTER — Ambulatory Visit: Admitting: Physical Therapy

## 2024-09-07 ENCOUNTER — Ambulatory Visit: Admitting: Speech Pathology

## 2024-09-13 ENCOUNTER — Ambulatory Visit: Payer: Self-pay | Admitting: Occupational Therapy

## 2024-09-13 ENCOUNTER — Ambulatory Visit: Admitting: Physical Therapy

## 2024-09-13 ENCOUNTER — Ambulatory Visit: Admitting: Speech Pathology

## 2024-09-13 DIAGNOSIS — R278 Other lack of coordination: Secondary | ICD-10-CM

## 2024-09-13 DIAGNOSIS — M6281 Muscle weakness (generalized): Secondary | ICD-10-CM

## 2024-09-13 DIAGNOSIS — R2681 Unsteadiness on feet: Secondary | ICD-10-CM

## 2024-09-13 DIAGNOSIS — R1312 Dysphagia, oropharyngeal phase: Secondary | ICD-10-CM

## 2024-09-13 DIAGNOSIS — R262 Difficulty in walking, not elsewhere classified: Secondary | ICD-10-CM

## 2024-09-13 DIAGNOSIS — R471 Dysarthria and anarthria: Secondary | ICD-10-CM

## 2024-09-13 NOTE — Therapy (Unsigned)
 " OUTPATIENT PHYSICAL THERAPY NEURO treatment    Patient Name: Dakota Davis MRN: [id number] DOB:Jan 23, 1966, 58 y.o., male Today's Date: 09/13/2024   PCP: Lenon Layman ORN, MD  REFERRING PROVIDER: Maree Jannett POUR, MD   END OF SESSION:  PT End of Session - 09/13/24 1533     Visit Number 2    Number of Visits 24    PT Start Time 1531    PT Stop Time 1615    PT Time Calculation (min) 44 min    Equipment Utilized During Treatment Gait belt    Activity Tolerance Patient tolerated treatment well    Behavior During Therapy WFL for tasks assessed/performed          Past Medical History:  Diagnosis Date   Anxiety    Anxiety    Arthritis    Depression    Hyperlipidemia    Insomnia    Lymphadenopathy, retroperitoneal    OSA (obstructive sleep apnea)    Parkinson disease (HCC)    Sciatica    Seizures (HCC)    Spinal stenosis    Spinal stenosis    Spondylisthesis    Past Surgical History:  Procedure Laterality Date   BACK SURGERY     BACK SURGERY     10/31/2016,12/21/2015   CARDIAC CATHETERIZATION     ESOPHAGOGASTRODUODENOSCOPY N/A 11/27/2021   Procedure: ESOPHAGOGASTRODUODENOSCOPY (EGD);  Surgeon: Toledo, Ladell POUR, MD;  Location: [facility];  Service: Gastroenterology;  Laterality: N/A;   KNEE ARTHROSCOPY     2018   LEFT HEART CATH AND CORONARY ANGIOGRAPHY N/A 08/05/2017   Procedure: LEFT HEART CATH AND CORONARY ANGIOGRAPHY;  Surgeon: Hester Wolm PARAS, MD;  Location: [facility];  Service: Cardiovascular;  Laterality: N/A;   TONSILECTOMY/ADENOIDECTOMY WITH MYRINGOTOMY     Patient Active Problem List   Diagnosis Date Noted   GAD (generalized anxiety disorder) 07/04/2018   PTSD (post-traumatic stress disorder) 07/04/2018   Stable angina 08/03/2017    ONSET DATE: hx of PD. 06/03/2024   REFERRING DIAG: Parkinson's disease without dyskinesia, without mention of fluctuations   THERAPY DIAG:  Muscle weakness (generalized)  Other lack of  coordination  Dysphagia, oropharyngeal phase  Difficulty in walking, not elsewhere classified  Unsteadiness on feet  Rationale for Evaluation and Treatment: Rehabilitation  SUBJECTIVE:                                                                                                                                                                                             SUBJECTIVE STATEMENT:  From Today.  Pt arrives to PT clinic with walking stick. States that since he was  seen 2 prior that his balance has continued to diminish. States that the weakness is noted in BLE.  Also reports that the fasciculations are now present in BLE as well fasciculations in the L side of the lip.   From Evaluation:  Pt is familiar to this clinic for PD management.  Pt reports that he was sitting in hospital waiting room in the September of 2025. Reports that his foot was starting drag on the R side with increased foot drop and has then progressive weakness in the foot and ankle. Also reports that he has experienced significant cramping in the bil calves and sole of the Bil foot. Also also reported increased fasciculations in the calves intermittently and occasionally in the L shoulder with jumping sensation   Reports that he has also experiencing significant difficulty with phonation and breathing. Over the last several months his vocal quality has gotten progressively worse.  Pt reports that he will have follow-up appointment with MD on January 12th at the latest, but is hoping to get appointment moved forward.    After recent MD appointment for Gastroenterology; which lead to Urgent referral back to Neurology.    Pt accompanied by: self  PERTINENT HISTORY:  PD.  Seizures (HCC)  Spinal stenosis  Spinal stenosis  Spondylisthesis    PAIN:  Are you having pain? No  PRECAUTIONS: Fall  RED FLAGS: Hx of multiple lumbar surgeries.    WEIGHT BEARING RESTRICTIONS: No  FALLS: Has patient fallen  in last 6 months? No  LIVING ENVIRONMENT: Lives with: lives with their spouse, lives with their son, and son is special needs  Lives in: House/apartment Stairs: Yes: Internal: 3 steps; none Has following equipment at home: walking stick uses intermittently    PLOF: Independent, Independent with basic ADLs, Independent with gait, Independent with transfers, and is keeping walking stick available in car.    PATIENT GOALS: improve strength in RLE.   OBJECTIVE:  Note: Objective measures were completed at Evaluation unless otherwise noted.  DIAGNOSTIC FINDINGS:   EMG Conclusion: This is an abnormal study. 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 The chronic reinnervation findings in lumbar and in right C8 innervated muscles may reflect chronic right C8 and lumbar radiculopathies in light of the clinical history of degenerative spine disease. There is no robust electrodiagnostic evidence for widespread, progressive lower motor neuron disease. Correlation with spine imaging is recommended.    Face MRI IMPRESSION: No abnormality identified along the course of the trigeminal nerves.  CT   IMPRESSION: 1. Prior C4-C6 ACDF without evidence of hardware complication. No residual high-grade stenosis. 2. Unchanged multilevel cervical spondylosis as described above. Unchanged moderate to severe right neuroforaminal stenosis at C3-C4. Unchanged mild left neuroforaminal stenosis at C2-C3 and C3-C4. COGNITION: Overall cognitive status: Within functional limits for tasks assessed   SENSATION: Light touch: Impaired  Distal loss of light touch on the RLE,   COORDINATION: Decreased coordination on the RLE with ankle to knee   EDEMA:  Noted atrophy in the R lower leg.   MUSCLE TONE: RLE: reduced tone on the RLE    POSTURE: forward head and weight shift left  LOWER EXTREMITY MMT:     MMT  Right Eval Left Eval  Hip  flexion 3+ 4  Hip extension    Hip abduction 4- 4  Hip adduction 4- 4  Hip internal rotation    Hip external rotation    Knee flexion 4- 5  Knee extension 4- 5  Ankle dorsiflexion 1 4-  Ankle plantarflexion 2 4  Ankle inversion    Ankle eversion 0 4-   (Blank rows = not tested)  LOWER EXTREMITY MMT:    MMT Right Eval Left Eval  Hip flexion    Hip extension    Hip abduction    Hip adduction    Hip internal rotation    Hip external rotation    Knee flexion    Knee extension    Ankle dorsiflexion Trace activation    Ankle plantarflexion    Ankle inversion    Ankle eversion No activation    (Blank rows = not tested)  BED MOBILITY:  Not tested  TRANSFERS: Sit to stand: Complete Independence  Assistive device utilized: None     Stand to sit: Complete Independence  Assistive device utilized: None      RAMP:  Not tested  CURB:  Not tested  STAIRS: Not tested GAIT: Findings: Gait Characteristics: vault on the RLLE , step through pattern, Right foot flat, and poor foot clearance- Right, Distance walked: 31ft, Assistive device utilized:None, Level of assistance: Complete Independence, and Comments: gait deviation as noted.   FUNCTIONAL TESTS:  5 times sit to stand: 13.21; noted lateral bias off of the RLE with sit<>stand. Timed up and go (TUG): TBD 10 meter walk test: 1.25m/s with foot drag intermittently on the RLE Berg Balance Scale:    [facility] PT Assessment - 09/13/24 0001       Berg Balance Test   Sit to Stand Able to stand without using hands and stabilize independently    Standing Unsupported Able to stand safely 2 minutes    Sitting with Back Unsupported but Feet Supported on Floor or Stool Able to sit safely and securely 2 minutes    Stand to Sit Sits safely with minimal use of hands    Transfers Able to transfer safely, minor use of hands    Standing Unsupported with Eyes Closed Able to stand 10 seconds safely    Standing Unsupported with Feet Together  Able to place feet together independently and stand for 1 minute with supervision    From Standing, Reach Forward with Outstretched Arm Can reach forward >12 cm safely (5)    From Standing Position, Pick up Object from Floor Able to pick up shoe safely and easily    From Standing Position, Turn to Look Behind Over each Shoulder Looks behind one side only/other side shows less weight shift   fusion limits rotation   Turn 360 Degrees Able to turn 360 degrees safely one side only in 4 seconds or less    Standing Unsupported, Alternately Place Feet on Step/Stool Able to stand independently and safely and complete 8 steps in 20 seconds    Standing Unsupported, One Foot in Front Able to place foot tandem independently and hold 30 seconds    Standing on One Leg Able to lift leg independently and hold > 10 seconds    Total Score 52      Functional Gait  Assessment   Gait Level Surface Walks 20 ft in less than 5.5 sec, no assistive devices, good speed, no evidence for imbalance, normal gait pattern, deviates no more than 6 in outside of the 12 in walkway width.    Change in Gait Speed Able to smoothly change walking speed without loss of balance or gait deviation. Deviate no more than 6 in outside of the 12 in walkway width.  Gait with Horizontal Head Turns Performs head turns smoothly with no change in gait. Deviates no more than 6 in outside 12 in walkway width    Gait with Vertical Head Turns Performs task with slight change in gait velocity (eg, minor disruption to smooth gait path), deviates 6 - 10 in outside 12 in walkway width or uses assistive device    Gait and Pivot Turn Pivot turns safely within 3 sec and stops quickly with no loss of balance.    Step Over Obstacle Is able to step over one shoe box (4.5 in total height) but must slow down and adjust steps to clear box safely. May require verbal cueing.    Gait with Narrow Base of Support Ambulates 4-7 steps.    Gait with Eyes Closed Walks 20  ft, slow speed, abnormal gait pattern, evidence for imbalance, deviates 10-15 in outside 12 in walkway width. Requires more than 9 sec to ambulate 20 ft.    Ambulating Backwards Walks 20 ft, uses assistive device, slower speed, mild gait deviations, deviates 6-10 in outside 12 in walkway width.    Steps Alternating feet, must use rail.    Total Score 21           PATIENT SURVEYS:  To be determined                                                                                                                               TREATMENT DATE: 09/13/2024    Extensive hx taken and Evaluation completed.  Education on differential diagnosis and variation in treatment plan based on neurology assessment.    PATIENT EDUCATION: Education details: differential diagnosis for PD vs upper/lower motor neuron disease  Person educated: Patient Education method: Explanation Education comprehension: verbalized understanding  HOME EXERCISE PROGRAM: Access Code: [email or web address] URL: [email or web address] Date: 09/13/2024 Prepared by: Massie Dollar  Exercises - Feet Together Balance at Counter Top Eyes Closed  - 1 x daily - 4 x weekly - 1 sets - 3 reps - 15 sec hold - Semi-Tandem Balance at Counter Top Eyes Closed  - 1 x daily - 4 x weekly - 1 sets - 2 reps - 15 sec  hold - Standing Tandem Balance with Counter Support  - 1 x daily - 4 x weekly - 1 sets - 2 reps - 15 sec hold - Standing Single Leg Stance with Counter Support  - 1 x daily - 4 x weekly - 1 sets - 2 reps - 10 sec  hold - Backward Walking with Counter Support  - 1 x daily - 4 x weekly - 3 sets - 10 reps  GOALS: Goals reviewed with patient? Yes   SHORT TERM GOALS: Target date: 09/29/2024    Patient will be independent in home exercise program to improve strength/mobility for better functional independence with ADLs. Baseline: to be given  Goal status: INITIAL   LONG TERM GOALS: Target date: 11/24/2024  2.  Patient  (> 79 years old) will complete five times sit to stand test in < 11 seconds indicating an increased LE strength and improved balance with symmetrical WB R and L . Baseline: 13.21 Goal status: INITIAL  3.  Patient will increase Berg Balance score by > 6 points to demonstrate decreased fall risk during functional activities Baseline: 52 Goal status: INITIAL  4.  Patient will increase 10 meter walk test to >1.62m/s as to improve gait speed for better community ambulation and to reduce fall risk with appropriate bracing to prevent foot drop. Baseline: 1.5m/s Goal status: INITIAL  5.  Patient will reduce timed up and go to <11 seconds to reduce fall risk and demonstrate improved transfer/gait ability without toe drag.  Baseline: to be completed  Goal status: INITIAL  6.  Patient will increase FGA gait index score by 4 points as to demonstrate reduced fall risk and improved dynamic gait balance for better safety with community/home ambulation.   Baseline: 21 Goal status: INITIAL   ASSESSMENT:  CLINICAL IMPRESSION: Patient is a 58 y.o. male who was seen today for physical therapy evaluation and treatment for PD and new foot drop. Pt provided extensive hx of progression of s/s in the RLE/LLE, and occasionally the L shoulder. Noted to have significant fasciculations and crampsin BLE as well as marked weakness in RLE with atrophy and reported profound decline in vocal quality and oral strength over the past several months. Pt is noted to have foot drag on the RLE and significant weakness coordination deficits  in the RLE on assessment. Discussed differential diagnosis given systemic apearance of new s/s and instructed pt on potential benefits and limitations of PT for continued neuropathy treatment depend on upper vs lower motor neuron pathology. At this time, pt may benefit from PT to improve balance, strength and coordination or provide education on adaptations based on diagnosis from neurologist.     OBJECTIVE IMPAIRMENTS: Abnormal gait, cardiopulmonary status limiting activity, decreased activity tolerance, decreased balance, decreased coordination, decreased endurance, decreased knowledge of condition, decreased knowledge of use of DME, decreased mobility, difficulty walking, decreased ROM, decreased strength, impaired sensation, impaired tone, and improper body mechanics.   ACTIVITY LIMITATIONS: squatting, stairs, locomotion level, and caring for others  PARTICIPATION LIMITATIONS: laundry, community activity, occupation, yard work, school, and church  PERSONAL FACTORS: Age, Fitness, Social background, Time since onset of injury/illness/exacerbation, and 1-2 comorbidities:   are also affecting patient's functional outcome.   REHAB POTENTIAL: Good  CLINICAL DECISION MAKING: Evolving/moderate complexity  EVALUATION COMPLEXITY: Moderate  PLAN:  PT FREQUENCY: 1-2x/week  PT DURATION: 12 weeks  PLANNED INTERVENTIONS: 97164- PT Re-evaluation, 97750- Physical Performance Testing, 97110-Therapeutic exercises, 97530- Therapeutic activity, [id number]- Neuromuscular re-education, 97535- Self Care, [id number]- Manual therapy, [id number]- Gait training, [phone]- Orthotic Initial, [phone]- Prosthetic Initial , [phone]- Electrical stimulation (unattended), [phone]- Electrical stimulation (manual), Patient/Family education, Balance training, Stair training, Taping, Compression bandaging, DME instructions, and Wheelchair mobility training  PLAN FOR NEXT SESSION:   Complete outcome measures. Follow up on Neurology referral    Massie FORBES Dollar, PT 09/13/2024, 3:34 PM        "

## 2024-09-13 NOTE — Therapy (Unsigned)
 " OUTPATIENT SPEECH LANGUAGE PATHOLOGY PARKINSON'S TREATMENT   Patient Name: Dakota Davis MRN: [id number] DOB:14-Dakota-1967, 58 y.o., male Today's Date: 09/13/2024   PCP: Layman Piety, MD REFERRING PROVIDER: Layman Piety, MD   End of Session - 09/13/24 1636     Visit Number 6    Number of Visits 25    Date for Recertification  10/27/24    Authorization Type [employer]    Progress Note Due on Visit 10    SLP Start Time 1415    SLP Stop Time  1445    SLP Time Calculation (min) 30 min    Activity Tolerance Patient tolerated treatment well;Patient limited by fatigue               Past Medical History:  Diagnosis Date   Anxiety    Anxiety    Arthritis    Depression    Hyperlipidemia    Insomnia    Lymphadenopathy, retroperitoneal    OSA (obstructive sleep apnea)    Parkinson disease (HCC)    Sciatica    Seizures (HCC)    Spinal stenosis    Spinal stenosis    Spondylisthesis    Past Surgical History:  Procedure Laterality Date   BACK SURGERY     BACK SURGERY     10/31/2016,12/21/2015   CARDIAC CATHETERIZATION     ESOPHAGOGASTRODUODENOSCOPY N/A 11/27/2021   Procedure: ESOPHAGOGASTRODUODENOSCOPY (EGD);  Surgeon: Toledo, Ladell POUR, MD;  Location: [facility] ENDOSCOPY;  Service: Gastroenterology;  Laterality: N/A;   KNEE ARTHROSCOPY     2018   LEFT HEART CATH AND CORONARY ANGIOGRAPHY N/A 08/05/2017   Procedure: LEFT HEART CATH AND CORONARY ANGIOGRAPHY;  Surgeon: Hester Wolm PARAS, MD;  Location: [facility] INVASIVE CV LAB;  Service: Cardiovascular;  Laterality: N/A;   TONSILECTOMY/ADENOIDECTOMY WITH MYRINGOTOMY     Patient Active Problem List   Diagnosis Date Noted   GAD (generalized anxiety disorder) 07/04/2018   PTSD (post-traumatic stress disorder) 07/04/2018   Stable angina 08/03/2017    ONSET DATE: Parkinson's dx'ed 2022, date of referral 08/01/2024  REFERRING DIAG: G20.A1 (ICD-10-CM) - Parkinson's disease (HCC)   THERAPY DIAG:  Dysphagia,  oropharyngeal phase  Dysarthria and anarthria  Rationale for Evaluation and Treatment Rehabilitation  SUBJECTIVE:   PERTINENT HISTORY: Pt is a 58 year old male with Parkinson's Disease, progressive medication wearing off time, esophageal dysphagia (most recent dilation 08/03/2024 - pt states some improvement noticed), decreased appetite with recent (08/01/2024) worsening symptoms of Parkinson's disease, including increased tremors, exceptional fatigue in his voice, significant swallowing difficulties with eating described as 'fatiguing' due to effort required to chew and move food in mouth, frequent choking episodes, poor nutrition due to fatigue associated with eating, weight loss despite attempts to increase calorie intake, Progressive dysphagia, jaw weakness, and fatigue are more severe than typical for Parkinson's disease at this stage. Differential diagnosis includes neuromuscular disorders such as myasthenia gravis and ALS.  DIAGNOSTIC FINDINGS:    EGD on 08/03/2024  Abnormal motility was noted in esophagus. The cricopharyngeus was normal. There is spasticity of the esophageal body. The distal esophagus/lower esophageal sphincter is open. No endoscopic abnormality was evident in the esophagus to explain the patient's complaint of dysphagia. It was decided, however, to proceed with dilation of the entire esophagus. Dilation was performed with a Maloney dilator with no resistance at 54 Fr. abnormal esophageal motility, suspicious for esophageal spasm.   EMG at [facility] scheduled for 08/31/2024  PAIN:  Are you having pain? No  FALLS: Has patient  fallen in last 6 months?  No  LIVING ENVIRONMENT: Lives with: lives with their family Lives in: House/apartment  PLOF:  Level of assistance: Independent with ADLs, Independent with IADLs Employment: Full-time employment  PATIENT GOALS    to improve hypophonia and reduce choking when consuming  PO items  SUBJECTIVE STATEMENT: Pt pleasant,  eager  Pt accompanied by: self  OBJECTIVE:  TODAY'S TREATMENT:   Skilled treatment session focused on pt's communication and dysphagia goals. SLP facilitated session by providing the following interventions:  Pt arrived late to session d/t extended time that he required walking across parking lot to entrance. Pt arrived with walking stick and appeared out of breath. When walking short distance down hallway to this writer's office, pt was visibly out of breath and stated it feels like I just ran a marathon.   Pt reports unexpected ability to eat anything that I wanted on Christmas Eve and Christmas Day, just like Thanksgiving but then it reverted back. I was hopeful for those two days that there might not be anything really wrong with me but of course that didn't last. Right now I am living off of [facility]   Despite reporting that the fasticulations have dramatically increased in the last week, especially in my lower legs, hands and my sister asked what's wrong with Dakota Davis's face because he was having fasiculations in face. He hasn't experienced any increase in his oral phase abilities.  He reports that the fasticulations don't hurt but often result in cramping.      PATIENT EDUCATION: Education details: see above Person educated: Patient Education method: Explanation Education comprehension: verbalized understanding and needs further education   HOME EXERCISE PROGRAM: Adhere to strict aspiration precautions   GOALS: Goals reviewed with patient? Yes  SHORT TERM GOALS: Target date: 10 sessions  During each recording session, the client will self-monitor or be coached to maintain a stable vocal intensity (volume) and articulation clarity for at least 50% of the recorded phrases, as measured by the clinicians review Baseline: Goal status: INITIAL  2.  The client will identify 5 personally meaningful phrases (e.g., I love you, Thank you, favorite jokes) and  record them in their natural voice, so that these messages can be preserved in their synthetic voice or message bank Baseline:  Goal status: INITIAL  3.  Patient will participate in objective swallowing evaluation (MBSS) to identify safest diet recommendation as well as therapeutic targets.  Baseline:  Goal status: INITIAL   LONG TERM GOALS: Target date: 10/27/2024  Pt will demonstrate understanding of multimodal methods of communication.  Baseline:  Goal status: INITIAL  2.  With Mod I, pt will demonstrate understanding of result and recommendations of MBSS by verbalizing salient points.  Baseline:  Goal status: INITIAL   ASSESSMENT:  CLINICAL IMPRESSION: Patient is a 58 y.o. male who was seen today for a voice and clinical swallow treatment d/t recent decline in speech and swallowing. Pt with history of Parkinson's Disease but now presenting with progressive atypical symptoms. Pt referred to [facility] for EMG to evaluate possible presence of additional neuromuscular disorders.   Pt reports recent significant steady global decline over the last several months: rapid decline in balance, right side weakness, voice and major swallow issues. While he reports getting choked on liquids and solids, he states chewing is the biggest problem, it is absolutely fatiguing. A couple of months ago, I had to have the himleck d/t exphixa when eating. Pt reports unintentional weight loss d/t fatigue with  PO consumption, especially when consuming solids.   Dysphagia He reports some improvement since EGD but continues to experience extreme oral fatigue. During this evaluation, pt presents with severe oral phase and mild pharyngeal phase dysphagia. When consuming thin liquids via cup, applesauce and graham crackers, pt's oral phase is c/b decreased lingual and mandibular movement, appearance of reduced bolus manipulation and effortful AP transfer of all boluses. Severity of impairment is observed more so with  single bit of graham cracker. Pt with attempts to orally manipulate bolus for > 1 minutes with fistulations observed in bilateral masseter muscles. He also describes pharyngeal sensation of effort to push it (bolus) down (his throat). It is better since yesterday (endoscopy) but still takes effort. Education provided on increasing consumption of protein shakes, soft meats such as tuna, yogurt. Will request order for a Modified Barium Swallow Study.   Dysarthria  Pt presents with severe dysarthria/dysphonia c/b severely reduced respiratory support, hoarse, breathy, raspy, strained vocal quality resulting in  subjective description of whispering. Pt with little audible phonation, inability to project his voice and significant vocal fatigue. Pt's articulatory movement is precise with no slurring observed.   See the above treatment note for details. Pt reports that he has an appt with neurology on January 12th.   OBJECTIVE IMPAIRMENTS include dysarthria, voice disorder, and dysphagia. These impairments are limiting patient from effectively communicating at home and in community and safety when swallowing. Factors affecting potential to achieve goals and functional outcome are medical prognosis, previous level of function, and severity of impairments. Patient will benefit from skilled SLP services to address above impairments and improve overall function.  REHAB POTENTIAL: Good  PLAN: SLP FREQUENCY: 1-2x/week  SLP DURATION: 12 weeks  PLANNED INTERVENTIONS: Aspiration precaution training, Diet toleration management , Trials of upgraded texture/liquids, Functional tasks, Multimodal communication approach, SLP instruction and feedback, Compensatory strategies, and Patient/family education    Daleah Coulson B. Rubbie, M.S., CCC-SLP, CBIS Speech-Language Pathologist Certified Brain Injury Specialist [facility]  [facility] [phone] Ascom  [phone] Fax [phone]  "

## 2024-09-13 NOTE — Therapy (Signed)
 " OUTPATIENT OCCUPATIONAL THERAPY NEURO TREATMENT NOTE  Patient Name: Dakota Davis MRN: [id number] DOB:May 03, 1966, 58 y.o., male Today's Date: 09/13/2024  PCP: Lenon Layman ORN, MD REFERRING PROVIDER: Onita Elspeth Sharper, MD  END OF SESSION:  OT End of Session - 09/13/24 1507     Visit Number 9    Number of Visits 12    Date for Recertification  10/12/24    OT Start Time 1445    OT Stop Time 1530    OT Time Calculation (min) 45 min    Activity Tolerance Patient tolerated treatment well    Behavior During Therapy [facility] for tasks assessed/performed           Past Medical History:  Diagnosis Date   Anxiety    Anxiety    Arthritis    Depression    Hyperlipidemia    Insomnia    Lymphadenopathy, retroperitoneal    OSA (obstructive sleep apnea)    Parkinson disease (HCC)    Sciatica    Seizures (HCC)    Spinal stenosis    Spinal stenosis    Spondylisthesis    Past Surgical History:  Procedure Laterality Date   BACK SURGERY     BACK SURGERY     10/31/2016,12/21/2015   CARDIAC CATHETERIZATION     ESOPHAGOGASTRODUODENOSCOPY N/A 11/27/2021   Procedure: ESOPHAGOGASTRODUODENOSCOPY (EGD);  Surgeon: Toledo, Ladell POUR, MD;  Location: [facility] ENDOSCOPY;  Service: Gastroenterology;  Laterality: N/A;   KNEE ARTHROSCOPY     2018   LEFT HEART CATH AND CORONARY ANGIOGRAPHY N/A 08/05/2017   Procedure: LEFT HEART CATH AND CORONARY ANGIOGRAPHY;  Surgeon: Hester Wolm PARAS, MD;  Location: [facility] INVASIVE CV LAB;  Service: Cardiovascular;  Laterality: N/A;   TONSILECTOMY/ADENOIDECTOMY WITH MYRINGOTOMY     Patient Active Problem List   Diagnosis Date Noted   GAD (generalized anxiety disorder) 07/04/2018   PTSD (post-traumatic stress disorder) 07/04/2018   Stable angina 08/03/2017    ONSET DATE: 09/2020  REFERRING DIAG: Parkinson's disease  THERAPY DIAG:  Muscle weakness (generalized)  Rationale for Evaluation and Treatment: Rehabilitation  SUBJECTIVE:   SUBJECTIVE  STATEMENT:   Upon arrival, Pt. reports noticing changes in gripping items with the right hand. Pt. has requested to have measurements obtained today-of note:   Pt accompanied by: self  PERTINENT HISTORY: Patient is a 58 year old male with a diagnosis of Parkinson's disease.  Patient was referred to occupational therapy for limited fine motor coordination skills and bilateral hand tremors.  Patient's hand tremors started with the left  hand with a gradual progression to the right hand. [name].  Patient has a recent history of severe right wrist and hand pain and has a upcoming orthopedic consult early next week on 07/25/2024.  Patient reports having a history of a triple cervical fusion last year.  Past medical history includes OSA, major depressive disorder (current), and prediabetes.  PRECAUTIONS: None  WEIGHT BEARING RESTRICTIONS: No  PAIN:  Are you having pain?   No reports of pain  FALLS: Has patient fallen in last 6 months? No  LIVING ENVIRONMENT: Lives with: [name] with wife, [name], and youngest son Lives in: House/apartment Stairs:  two levels, 3 little steps from garage Has following equipment at home: None  PLOF: Independent  PATIENT GOALS:  To improve FMC   OBJECTIVE:  Note: Objective measures were completed at Evaluation unless otherwise noted.  HAND DOMINANCE: Right  ADLs:  Transfers/ambulation related to ADLs: Independent Eating: Increased time, difficulty  at times cutting meat, Independent holding a full drink-two hands initially 2/2 onset of right hand pain Grooming: Independent UB Dressing: Independent LB Dressing: Independent; occasionally has to redo tying shoes. 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 Leisure: Being outside, yard work, reading history research    MOBILITY STATUS: Independent  POSTURE COMMENTS:  Intact  ACTIVITY TOLERANCE: Activity tolerance: Depends on the task he is doing, typically approximately 30 min.  FUNCTIONAL OUTCOME MEASURES: MAM-20: 65/80  09/13/24: MAM-20: 62/80  UPPER EXTREMITY ROM:  *History of left shoulder tears.  Active ROM Right Eval WFL Left Eval [facility]  Shoulder flexion    Shoulder abduction    Shoulder adduction    Shoulder extension    Shoulder internal rotation    Shoulder external rotation    Elbow flexion    Elbow extension    Wrist flexion    Wrist extension    Wrist ulnar deviation    Wrist radial deviation    Wrist pronation    Wrist supination    (Blank rows = not tested)  UPPER EXTREMITY MMT:     MMT Right eval Left eval  Shoulder flexion 5 5  Shoulder abduction 5 5  Shoulder adduction    Shoulder extension    Shoulder internal rotation    Shoulder external rotation    Middle trapezius    Lower trapezius    Elbow flexion 5 5  Elbow extension 5 5  Wrist flexion 4- 5  Wrist extension 4- 5  Wrist ulnar deviation    Wrist radial deviation    Wrist pronation 5 5  Wrist supination 5 5  (Blank rows = not tested)  HAND FUNCTION: Grip strength: Right: 94 lbs; Left: 112 lbs, Lateral pinch: Right: 38 lbs, Left: 37 lbs, and 3 point pinch: Right: 32 lbs, Left: 25 lbs with the Saehan Pinch Meter  08/18/24:  Grip strength: Right: 110 lbs; Left: 121 lbs, Lateral pinch: Right: 24 lbs, Left: 21 lbs, and 3 point pinch: Right: 22 lbs, Left: 19 lbs with the Jamar Pinch Meter  09/13/24:  Grip strength: Right: 98 lbs; Left: 129 lbs, Lateral pinch: Right: 21 lbs, Left: 22 lbs, and 3 point pinch: Right: 20  lbs, Left: 20 lbs with the Jamar Pinch Meter    COORDINATION: 9 Hole Peg test: Right: 28 sec; Left: 37 sec  09/13/24: 9 Hole Peg test: Right: 26 sec; Left: 27 sec  SENSATION: Light touch: WFL Proprioception: WFL  EDEMA: Intact  MUSCLE TONE: WFL  COGNITION: Overall cognitive status: Patient reports having to concentrate while talking, as he loses his train of thoughts more frequently.  Patient reports concern about the the volume of his voice has changing. Patient also reports changes in moving food through his mouth in preparation for swallowing.  A a referral for speech therapy consult was requested  VISION: Patient reports planning to follow-up with his eye-care physician  TREATMENT DATE: 09/13/2024  Measurements were obtained, and goals were reviewed with the Pt.   Education details: [facility] skills,  hand strengthening with blue theraputty Person educated: Patient Education method: Explanation, Demonstration, Tactile cues, and Verbal cues Education comprehension: verbalized understanding, returned demonstration, verbal cues required, tactile  cues required, and needs further education  HOME EXERCISE PROGRAM:  -Hand strengthening with blue theraputty -Condition management: compensatory strategies for tremors stabilizing proximally for forearm support for distal control. -bilateral [facility] skills.   GOALS: Goals reviewed with patient? Yes  SHORT TERM GOALS: Target date: 08/31/2024   Patient will be independent with HEPs for BUE functioning. Baseline: 09/13/24: Independent Eval: No Current home exercise program Goal status: Ongoing  LONG TERM GOALS: Target date: 10/12/2024  Patient will independently and efficiently be able to cut meat. Baseline: 09/13/24: Pt. Has a little difficulty cutting meat (Level 3/4 as scored on the MAM-20)  Eval: Patient presents with difficulty cutting meat efficiently Goal status: Ongoing  2.  Patient will independently grasp, and manipulate  multiple medication pills efficiently with his right hand Baseline: 09/13/24: Pt. Is is able to grasp pills with his right hand, however requires them to be stabilized between his finger and abdomen in order to reposition them. Eval: Pt. Has difficulty manipulating to pills at a time efficiently in his hand. Goal status: Ongoing  3.  Patient will independently and efficiently be able to clip nails Baseline: 09/13/24: Pt. has a little difficulty completing (As scored as a level 3/4 on the MAM-20) Eval: Patient scored 2-3/4 for cutting nails with a nail clipper portion on the Mam-20 Goal status: improving, ongoing  4.  Patient will grasp 1 item efficiently out of a full pocket Baseline: 09/13/24: Pt. Continues to have difficulty removing one item at a time from a pocket.Eval: Patient has difficulty grasping 1 item efficiently from multiple items in his pocket Goal status: Ongoing  5.  Patient will efficiently write a 3 sentence paragraph with 75% legibility and minimal micrographia  Baseline: 09/13/24: 4 sentences completed in 2 min. & 58 sec. With 75% legibility.  Eval: Name only 50% legibility with moderate micrographia Goal status: Ongoing  6. Pt. Will complete a 5 min. typing test with speed 30 wpm with 98% accuracy.  Baseline: 09/13/2024: 5 min typing test completed with 24 wpm with 96% accuracy.  Goal Status: New  ASSESSMENT:  CLINICAL IMPRESSION:  Pt. has returned to the clinic after travelling out of town for the holidays. Pt. reports having changes with increased fasciculations in his bilateral LEs, the left side of his mouth, and the left 1st dorsal interossei muscle. Pt. reports that he has noticed changes in his grip, and has requested for measurements to be obtained today. Measurements were obtained, and goals were reviewed with the Pt. Pt. has experienced a decrease in right grip strength, lateral, and 3pt. Pinch strength.  As well as a decreased in the MAM-20 score. Pt. has  increased left grip, and pinch strength. Improvements were made bilaterally for [facility] skills. Pt. Is progressing with goals for independence with HEP, as well as the goal for clipping nails. A new goal was added to the POC for improving typing speed and accuracy. The 10 visit progress report is due next visit. Will Plan to carry over this progress update completed today to the next visit. The patient continues to benefit from OT services to work on improving hand function skills in order to maximize overall independence with ADLs and IADL tasks  PERFORMANCE DEFICITS: in functional skills including ADLs, IADLs, coordination, dexterity, ROM, strength, pain, Fine motor control, and Gross motor control, cognitive skills including attention and memory, and psychosocial skills including coping strategies, environmental adaptation, interpersonal interactions, and routines and behaviors.   IMPAIRMENTS: are limiting patient from ADLs, IADLs, and leisure.  CO-MORBIDITIES: may have co-morbidities  that affects occupational performance. Patient will benefit from skilled OT to address above impairments and improve overall function.  MODIFICATION OR ASSISTANCE TO COMPLETE EVALUATION: Min-Moderate modification of tasks or assist with assess necessary to complete an evaluation.  OT OCCUPATIONAL PROFILE AND HISTORY: Detailed assessment: Review of records and additional review of physical, cognitive, psychosocial history related to current functional performance.  CLINICAL DECISION MAKING: Moderate - several treatment options, min-mod task modification necessary  REHAB POTENTIAL: Good  EVALUATION COMPLEXITY: Moderate    PLAN:  OT FREQUENCY: 1x a week  OT DURATION: 12 weeks  PLANNED INTERVENTIONS: 97168 OT Re-evaluation, 97535 self care/ADL training, 02889 therapeutic exercise, 97530 therapeutic activity, 97112 neuromuscular re-education, 97140 manual therapy, 97018 paraffin, 02989 moist heat, 97034 contrast  bath, 97033 iontophoresis, 97129 Cognitive training (first 15 min), 02869 Cognitive training(each additional 15 min), 02239 Orthotic Initial, 97763 Orthotic/Prosthetic subsequent, energy conservation, coping strategies training, patient/family education, and DME and/or AE instructions  RECOMMENDED OTHER SERVICES: ST  CONSULTED AND AGREED WITH PLAN OF CARE: Patient  PLAN FOR NEXT SESSION: Treatment  Richardson Otter, MS, OTR/L  09/13/2024, 9:00 PM         "

## 2024-09-16 ENCOUNTER — Ambulatory Visit: Attending: Neurology | Admitting: Physical Therapy

## 2024-09-16 DIAGNOSIS — R471 Dysarthria and anarthria: Secondary | ICD-10-CM | POA: Insufficient documentation

## 2024-09-16 DIAGNOSIS — R2681 Unsteadiness on feet: Secondary | ICD-10-CM | POA: Insufficient documentation

## 2024-09-16 DIAGNOSIS — R262 Difficulty in walking, not elsewhere classified: Secondary | ICD-10-CM | POA: Insufficient documentation

## 2024-09-16 DIAGNOSIS — M6281 Muscle weakness (generalized): Secondary | ICD-10-CM | POA: Insufficient documentation

## 2024-09-16 DIAGNOSIS — R1312 Dysphagia, oropharyngeal phase: Secondary | ICD-10-CM | POA: Insufficient documentation

## 2024-09-16 DIAGNOSIS — R278 Other lack of coordination: Secondary | ICD-10-CM | POA: Insufficient documentation

## 2024-09-16 NOTE — Therapy (Signed)
 " OUTPATIENT PHYSICAL THERAPY NEURO treatment    Patient Name: Dakota Davis MRN: [id number] DOB:[birth year], 59 y.o., male Today's Date: 09/16/2024   PCP: Lenon Layman ORN, MD  REFERRING PROVIDER: Maree Jannett POUR, MD   END OF SESSION:  PT End of Session - 09/16/24 0852     Visit Number 3    Number of Visits 24    Date for Recertification  11/24/24    PT Start Time 0850    PT Stop Time 0930    PT Time Calculation (min) 40 min    Equipment Utilized During Treatment Gait belt    Activity Tolerance Patient tolerated treatment well    Behavior During Therapy WFL for tasks assessed/performed          Past Medical History:  Diagnosis Date   Anxiety    Anxiety    Arthritis    Depression    Hyperlipidemia    Insomnia    Lymphadenopathy, retroperitoneal    OSA (obstructive sleep apnea)    Parkinson disease (HCC)    Sciatica    Seizures (HCC)    Spinal stenosis    Spinal stenosis    Spondylisthesis    Past Surgical History:  Procedure Laterality Date   BACK SURGERY     BACK SURGERY     10/31/2016,12/21/2015   CARDIAC CATHETERIZATION     ESOPHAGOGASTRODUODENOSCOPY N/A 11/27/2021   Procedure: ESOPHAGOGASTRODUODENOSCOPY (EGD);  Surgeon: Toledo, Ladell POUR, MD;  Location: [facility];  Service: Gastroenterology;  Laterality: N/A;   KNEE ARTHROSCOPY     2018   LEFT HEART CATH AND CORONARY ANGIOGRAPHY N/A 08/05/2017   Procedure: LEFT HEART CATH AND CORONARY ANGIOGRAPHY;  Surgeon: Hester Wolm PARAS, MD;  Location: [facility];  Service: Cardiovascular;  Laterality: N/A;   TONSILECTOMY/ADENOIDECTOMY WITH MYRINGOTOMY     Patient Active Problem List   Diagnosis Date Noted   GAD (generalized anxiety disorder) 07/04/2018   PTSD (post-traumatic stress disorder) 07/04/2018   Stable angina 08/03/2017    ONSET DATE: hx of PD. 06/03/2024   REFERRING DIAG: Parkinson's disease without dyskinesia, without mention of fluctuations   THERAPY DIAG:  Muscle weakness  (generalized)  Unsteadiness on feet  Other lack of coordination  Dysphagia, oropharyngeal phase  Difficulty in walking, not elsewhere classified  Rationale for Evaluation and Treatment: Rehabilitation  SUBJECTIVE:                                                                                                                                                                                             SUBJECTIVE STATEMENT:  From Today. Pt arrives in clinic with walking  stick. Reports no significant changes since last PT treatment. Still very SOB following ambulation from parking lot.     12/31: Pt arrives to PT clinic with walking stick. States that since he was seen 2 prior that his balance has continued to diminish. States that the weakness and numbness are now noted in BLE rather than just RLE.  Also reports that the fasciculations are now present in BLE as well fasciculations in the L side of the lip. All of which are new/worse since PT evaluation on 12/19.   From Evaluation:  Pt is familiar to this clinic for PD management.  Pt reports that he was sitting in hospital waiting room in the September of 2025. Reports that his foot was starting drag on the R side with increased foot drop and has then progressive weakness in the foot and ankle. Also reports that he has experienced significant cramping in the bil calves and sole of the Bil foot. Also also reported increased fasciculations in the calves intermittently and occasionally in the L shoulder with jumping sensation   Reports that he has also experiencing significant difficulty with phonation and breathing. Over the last several months his vocal quality has gotten progressively worse.  Pt reports that he will have follow-up appointment with MD on January 12th at the latest, but is hoping to get appointment moved forward.    After recent MD appointment for Gastroenterology; which lead to Urgent referral back to Neurology.    Pt  accompanied by: self  PERTINENT HISTORY:  PD.  Seizures (HCC)  Spinal stenosis  Spinal stenosis  Spondylisthesis    PAIN:  Are you having pain? No  PRECAUTIONS: Fall  RED FLAGS: Hx of multiple lumbar surgeries.    WEIGHT BEARING RESTRICTIONS: No  FALLS: Has patient fallen in last 6 months? No  LIVING ENVIRONMENT: Lives with: lives with their spouse, lives with their son, and son is special needs  Lives in: House/apartment Stairs: Yes: Internal: 3 steps; none Has following equipment at home: walking stick uses intermittently    PLOF: Independent, Independent with basic ADLs, Independent with gait, Independent with transfers, and is keeping walking stick available in car.    PATIENT GOALS: improve strength in RLE.   OBJECTIVE:  Note: Objective measures were completed at Evaluation unless otherwise noted.  DIAGNOSTIC FINDINGS:   EMG Conclusion: This is an abnormal study. 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 The chronic reinnervation findings in lumbar and in right C8 innervated muscles may reflect chronic right C8 and lumbar radiculopathies in light of the clinical history of degenerative spine disease. There is no robust electrodiagnostic evidence for widespread, progressive lower motor neuron disease. Correlation with spine imaging is recommended.    Face MRI IMPRESSION: No abnormality identified along the course of the trigeminal nerves.  CT   IMPRESSION: 1. Prior C4-C6 ACDF without evidence of hardware complication. No residual high-grade stenosis. 2. Unchanged multilevel cervical spondylosis as described above. Unchanged moderate to severe right neuroforaminal stenosis at C3-C4. Unchanged mild left neuroforaminal stenosis at C2-C3 and C3-C4. COGNITION: Overall cognitive status: Within functional limits for tasks assessed   SENSATION: Light touch: Impaired  Distal loss of light touch  on the RLE,   COORDINATION: Decreased coordination on the RLE with ankle to knee   EDEMA:  Noted atrophy in the R lower leg.   MUSCLE TONE: RLE: reduced tone on the RLE    POSTURE: forward head and weight shift  left  LOWER EXTREMITY MMT:     MMT  Right Eval Left Eval  Hip flexion 3+ 4  Hip extension    Hip abduction 4- 4  Hip adduction 4- 4  Hip internal rotation    Hip external rotation    Knee flexion 4- 5  Knee extension 4- 5  Ankle dorsiflexion 1 4-  Ankle plantarflexion 2 4  Ankle inversion    Ankle eversion 0 4-   (Blank rows = not tested)  LOWER EXTREMITY MMT:    MMT Right Eval Left Eval  Hip flexion    Hip extension    Hip abduction    Hip adduction    Hip internal rotation    Hip external rotation    Knee flexion    Knee extension    Ankle dorsiflexion Trace activation    Ankle plantarflexion    Ankle inversion    Ankle eversion No activation    (Blank rows = not tested)  BED MOBILITY:  Not tested  TRANSFERS: Sit to stand: Complete Independence  Assistive device utilized: None     Stand to sit: Complete Independence  Assistive device utilized: None      RAMP:  Not tested  CURB:  Not tested  STAIRS: Not tested GAIT: Findings: Gait Characteristics: vault on the RLLE , step through pattern, Right foot flat, and poor foot clearance- Right, Distance walked: 97ft, Assistive device utilized:None, Level of assistance: Complete Independence, and Comments: gait deviation as noted.   FUNCTIONAL TESTS:  5 times sit to stand: 13.21; noted lateral bias off of the RLE with sit<>stand. Timed up and go (TUG): TBD 10 meter walk test: 1.2m/s with foot drag intermittently on the RLE Berg Balance Scale:       PATIENT SURVEYS:  To be determined                                                                                                                               TREATMENT DATE: 09/16/2024  HEP review  Standing at rail on wall:  Tandem 15  sec x 3 bil  SLS 15 sce x 3 bil  Forward/reverse gait 44ft x 5 each.   AFO education on over the counter vs prescription foot up assist.  Gait with PLS and medial support x 128ft. Noted to have decreased step length and medial whip/adduction. Gait with anterior support AFO x 167ft. Improved step length and neutral toe off and step width with anterior support compared to PLS.   Pt may benefit from use of anterior support AFO foot drop management. Will need to send referral to MD if patient in agreement.          PATIENT EDUCATION: Education details: differential diagnosis for PD vs upper/lower motor neuron disease  Person educated: Patient Education method: Explanation Education comprehension: verbalized understanding  HOME EXERCISE PROGRAM: Access Code: [email or web address] URL: [email or web address] Date: 09/13/2024 Prepared by: Massie Dollar  Exercises - Feet Together  Balance at [employer] Eyes Closed  - 1 x daily - 4 x weekly - 1 sets - 3 reps - 15 sec hold - Semi-Tandem Balance at [employer] Eyes Closed  - 1 x daily - 4 x weekly - 1 sets - 2 reps - 15 sec  hold - Standing Tandem Balance with Counter Support  - 1 x daily - 4 x weekly - 1 sets - 2 reps - 15 sec hold - Standing Single Leg Stance with Counter Support  - 1 x daily - 4 x weekly - 1 sets - 2 reps - 10 sec  hold - Backward Walking with Counter Support  - 1 x daily - 4 x weekly - 3 sets - 10 reps  GOALS: Goals reviewed with patient? Yes   SHORT TERM GOALS: Target date: 09/29/2024    Patient will be independent in home exercise program to improve strength/mobility for better functional independence with ADLs. Baseline: to be given  Goal status: INITIAL   LONG TERM GOALS: Target date: 11/24/2024     2.  Patient (> 64 years old) will complete five times sit to stand test in < 11 seconds indicating an increased LE strength and improved balance with symmetrical WB R and L . Baseline: 13.21 Goal status:  INITIAL  3.  Patient will increase Berg Balance score by > 6 points to demonstrate decreased fall risk during functional activities Baseline: 52 Goal status: INITIAL  4.  Patient will increase 10 meter walk test to >1.44m/s as to improve gait speed for better community ambulation and to reduce fall risk with appropriate bracing to prevent foot drop. Baseline: 1.37m/s Goal status: INITIAL  5.  Patient will reduce timed up and go to <11 seconds to reduce fall risk and demonstrate improved transfer/gait ability without toe drag.  Baseline: to be completed  Goal status: INITIAL  6.  Patient will increase FGA gait index score by 4 points as to demonstrate reduced fall risk and improved dynamic gait balance for better safety with community/home ambulation.   Baseline: 21 Goal status: INITIAL   ASSESSMENT:  CLINICAL IMPRESSION: Patient is a 59 y.o. male who was seen today for physical therapy evaluation and treatment for PD and new foot drop. Pt provided extensive hx of progression of s/s in the RLE/LLE, and occasionally the L shoulder. Noted to have significant fasciculations and crampsin BLE as well as marked weakness in RLE with atrophy and reported profound decline in vocal quality and oral strength over the past several months. Pt is noted to have foot drag on the RLE and significant weakness coordination deficits  in the RLE on assessment. On 12/31 pt reports new numbness and fasiculations on the LLE as well as in the L side of lip. S/s have continued to today, but no worse than prior session. Education on foot up assist bracing of orthotic use to reduce fall risk and reduce physical demand with ambulation. Noted to have improved gait mechanics with anterior upright on AFO compared to PLS. My benefit from AFO referral for this form of bracing. Continued education on importance of physical activity level below fatigue/failure level At this time. Will have follow-up with neurology on 09/26/24. Pt  may benefit from PT to improve balance, strength and coordination or provide education on adaptations based on diagnosis from neurologist.    OBJECTIVE IMPAIRMENTS: Abnormal gait, cardiopulmonary status limiting activity, decreased activity tolerance, decreased balance, decreased coordination, decreased endurance, decreased knowledge of condition, decreased knowledge of use  of DME, decreased mobility, difficulty walking, decreased ROM, decreased strength, impaired sensation, impaired tone, and improper body mechanics.   ACTIVITY LIMITATIONS: squatting, stairs, locomotion level, and caring for others  PARTICIPATION LIMITATIONS: laundry, community activity, occupation, yard work, school, and church  PERSONAL FACTORS: Age, Fitness, Social background, Time since onset of injury/illness/exacerbation, and 1-2 comorbidities:   are also affecting patient's functional outcome.   REHAB POTENTIAL: Good  CLINICAL DECISION MAKING: Evolving/moderate complexity  EVALUATION COMPLEXITY: Moderate  PLAN:  PT FREQUENCY: 1-2x/week  PT DURATION: 12 weeks  PLANNED INTERVENTIONS: 97164- PT Re-evaluation, 97750- Physical Performance Testing, 97110-Therapeutic exercises, 97530- Therapeutic activity, [id number]- Neuromuscular re-education, 97535- Self Care, [id number]- Manual therapy, [id number]- Gait training, [phone]- Orthotic Initial, [phone]- Prosthetic Initial , [phone]- Electrical stimulation (unattended), [phone]- Electrical stimulation (manual), Patient/Family education, Balance training, Stair training, Taping, Compression bandaging, DME instructions, and Wheelchair mobility training  PLAN FOR NEXT SESSION:   Low intensity balance training.  Encourage energy preservation.  Continue AFO ed as needed    Massie FORBES Dollar, PT 09/16/2024, 8:53 AM        "

## 2024-09-20 ENCOUNTER — Ambulatory Visit: Payer: Self-pay | Admitting: Occupational Therapy

## 2024-09-20 ENCOUNTER — Ambulatory Visit: Admitting: Speech Pathology

## 2024-09-20 DIAGNOSIS — R471 Dysarthria and anarthria: Secondary | ICD-10-CM

## 2024-09-20 DIAGNOSIS — M6281 Muscle weakness (generalized): Secondary | ICD-10-CM

## 2024-09-20 DIAGNOSIS — R278 Other lack of coordination: Secondary | ICD-10-CM

## 2024-09-20 DIAGNOSIS — R1312 Dysphagia, oropharyngeal phase: Secondary | ICD-10-CM

## 2024-09-20 NOTE — Therapy (Signed)
 " OUTPATIENT SPEECH LANGUAGE PATHOLOGY PARKINSON'S TREATMENT   Patient Name: Dakota Davis MRN: [id number] DOB:02/24/1966, 59 y.o., male Today's Date: 09/20/2024   PCP: Layman Piety, MD REFERRING PROVIDER: Layman Piety, MD   End of Session - 09/20/24 1536     Visit Number 7    Number of Visits 25    Date for Recertification  10/27/24    Authorization Type [employer]    Progress Note Due on Visit 10    SLP Start Time 1450    SLP Stop Time  1530    SLP Time Calculation (min) 40 min    Activity Tolerance Patient tolerated treatment well               Past Medical History:  Diagnosis Date   Anxiety    Anxiety    Arthritis    Depression    Hyperlipidemia    Insomnia    Lymphadenopathy, retroperitoneal    OSA (obstructive sleep apnea)    Parkinson disease (HCC)    Sciatica    Seizures (HCC)    Spinal stenosis    Spinal stenosis    Spondylisthesis    Past Surgical History:  Procedure Laterality Date   BACK SURGERY     BACK SURGERY     10/31/2016,12/21/2015   CARDIAC CATHETERIZATION     ESOPHAGOGASTRODUODENOSCOPY N/A 11/27/2021   Procedure: ESOPHAGOGASTRODUODENOSCOPY (EGD);  Surgeon: Toledo, Ladell POUR, MD;  Location: [facility] ENDOSCOPY;  Service: Gastroenterology;  Laterality: N/A;   KNEE ARTHROSCOPY     2018   LEFT HEART CATH AND CORONARY ANGIOGRAPHY N/A 08/05/2017   Procedure: LEFT HEART CATH AND CORONARY ANGIOGRAPHY;  Surgeon: Hester Wolm PARAS, MD;  Location: [facility] INVASIVE CV LAB;  Service: Cardiovascular;  Laterality: N/A;   TONSILECTOMY/ADENOIDECTOMY WITH MYRINGOTOMY     Patient Active Problem List   Diagnosis Date Noted   GAD (generalized anxiety disorder) 07/04/2018   PTSD (post-traumatic stress disorder) 07/04/2018   Stable angina 08/03/2017    ONSET DATE: Parkinson's dx'ed 2022, date of referral 08/01/2024  REFERRING DIAG: G20.A1 (ICD-10-CM) - Parkinson's disease (HCC)   THERAPY DIAG:  Dysarthria and anarthria  Dysphagia,  oropharyngeal phase  Rationale for Evaluation and Treatment Rehabilitation  SUBJECTIVE:   PERTINENT HISTORY: Pt is a 59 year old male with Parkinson's Disease, progressive medication wearing off time, esophageal dysphagia (most recent dilation 08/03/2024 - pt states some improvement noticed), decreased appetite with recent (08/01/2024) worsening symptoms of Parkinson's disease, including increased tremors, exceptional fatigue in his voice, significant swallowing difficulties with eating described as 'fatiguing' due to effort required to chew and move food in mouth, frequent choking episodes, poor nutrition due to fatigue associated with eating, weight loss despite attempts to increase calorie intake, Progressive dysphagia, jaw weakness, and fatigue are more severe than typical for Parkinson's disease at this stage. Differential diagnosis includes neuromuscular disorders such as myasthenia gravis and ALS.  DIAGNOSTIC FINDINGS:    EGD on 08/03/2024  Abnormal motility was noted in esophagus. The cricopharyngeus was normal. There is spasticity of the esophageal body. The distal esophagus/lower esophageal sphincter is open. No endoscopic abnormality was evident in the esophagus to explain the patient's complaint of dysphagia. It was decided, however, to proceed with dilation of the entire esophagus. Dilation was performed with a Maloney dilator with no resistance at 54 Fr. abnormal esophageal motility, suspicious for esophageal spasm.   EMG at [facility] scheduled for 08/31/2024  PAIN:  Are you having pain? No  FALLS: Has patient fallen in last  6 months?  No  LIVING ENVIRONMENT: Lives with: lives with their family Lives in: House/apartment  PLOF:  Level of assistance: Independent with ADLs, Independent with IADLs Employment: Full-time employment  PATIENT GOALS    to improve hypophonia and reduce choking when consuming  PO items  SUBJECTIVE STATEMENT: Pt pleasant, eager  Pt accompanied by:  self  OBJECTIVE:  TODAY'S TREATMENT:   Skilled treatment session focused on pt's communication and dysphagia goals. SLP facilitated session by providing the following interventions:  Pt with progressive difficulty maintaining breath support throughout conversation.   Pt with upcoming appt at neurology. This clinical research associate sent the following email update from treatment team.   He continues to experience significant decline each week and we wanted to provide an update. 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 With the start of the year, he is mostly performing administrative tasks. He is concerned that he will be asked to step out of his role and be financially unprepared. Ultimately, he would like to step of the role himself, start the process of social security disability but isnt able to until he has a diagnosis that conceptualizes his current symptoms/deficits. Dysphagia - oral phase impairments and fatigue continue to worsen. He has worsening lingual fasciculations, left jaw and left side of lip fasciculations. Yesterday, he primary spoke out of the right side of his mouth.   He is currently living off of [employer] Physical - he was evaluated by PT (09/02/2024) He continues to have complete numbness in his right leg, foot and toes - unable to move toes or ankle. He has started using a walking cane. 09/13/2024 Pt began demonstrating weakness and fasciculations are now in BOTH left, not just the right. Cramping is worse in the RLE and is now present in the LLE. He has started feeling light numbness in the LLE now. He reports (and has a video to show you) of new visible fasciculations on the left.  PT  is working with pt on possible AFOs. Respiratory - newest symptoms In the last 10 days to week, pt has started becoming easily winded.  Pt becomes short of breath when walking a short distance to my office from the waiting room. He reports that he is having to take rest breaks during basic household tasks. He describes needing need to call a time out during an activity to sit down and recover. It takes me 20-30 minutes of focused controlled breathing to resume normal pattern of breathing. As mentioned above, this getting winded more frequently is impacting his already reduced speech intelligibility.  Pt concerns/goals In all transparency, despite the negative EMG, Sherida feels that his symptoms indicate that he has ALS.  He is concerned that the results of the EMG might supersede the severity of his symptoms and prevent a formal diagnosis (such as ALS) that better conceptualizes the disability that he is currently experiencing. Given the fast progression of symptoms, he and his wife are looking to you/this appointment for such a potential diagnosis to help him access quicker services under the [employer] Act, meet with their financial advisors etc.  As a therapy team, the more information we have, the more appropriately we can create a safe plan of  care.    PATIENT EDUCATION: Education details: see above Person educated: Patient Education method: Explanation Education comprehension: verbalized understanding and needs further education   HOME EXERCISE PROGRAM: Adhere to strict aspiration precautions   GOALS: Goals reviewed with patient? Yes  SHORT TERM GOALS: Target date: 10 sessions  During each recording session, the client will self-monitor or be coached to maintain a stable vocal intensity (volume) and articulation clarity for at least 50% of the recorded phrases, as measured by the clinicians review Baseline: Goal status: INITIAL  2.  The client will identify 5 personally  meaningful phrases (e.g., I love you, Thank you, favorite jokes) and record them in their natural voice, so that these messages can be preserved in their synthetic voice or message bank Baseline:  Goal status: INITIAL  3.  Patient will participate in objective swallowing evaluation (MBSS) to identify safest diet recommendation as well as therapeutic targets.  Baseline:  Goal status: INITIAL   LONG TERM GOALS: Target date: 10/27/2024  Pt will demonstrate understanding of multimodal methods of communication.  Baseline:  Goal status: INITIAL  2.  With Mod I, pt will demonstrate understanding of result and recommendations of MBSS by verbalizing salient points.  Baseline:  Goal status: INITIAL   ASSESSMENT:  CLINICAL IMPRESSION: Patient is a 59 y.o. male who was seen today for a voice and clinical swallow treatment d/t recent decline in speech and swallowing. Pt with history of Parkinson's Disease but now presenting with progressive atypical symptoms. Pt referred to [facility] for EMG to evaluate possible presence of additional neuromuscular disorders.   Pt reports recent significant steady global decline over the last several months: rapid decline in balance, right side weakness, voice and major swallow issues. While he reports getting choked on liquids and solids, he states chewing is the biggest problem, it is absolutely fatiguing. A couple of months ago, I had to have the himleck d/t exphixa when eating. Pt reports unintentional weight loss d/t fatigue with PO consumption, especially when consuming solids.   Dysphagia He reports some improvement since EGD but continues to experience extreme oral fatigue. During this evaluation, pt presents with severe oral phase and mild pharyngeal phase dysphagia. When consuming thin liquids via cup, applesauce and graham crackers, pt's oral phase is c/b decreased lingual and mandibular movement, appearance of reduced bolus manipulation and effortful AP  transfer of all boluses. Severity of impairment is observed more so with single bit of graham cracker. Pt with attempts to orally manipulate bolus for > 1 minutes with fistulations observed in bilateral masseter muscles. He also describes pharyngeal sensation of effort to push it (bolus) down (his throat). It is better since yesterday (endoscopy) but still takes effort. Education provided on increasing consumption of protein shakes, soft meats such as tuna, yogurt. Will request order for a Modified Barium Swallow Study.   Dysarthria  Pt presents with severe dysarthria/dysphonia c/b severely reduced respiratory support, hoarse, breathy, raspy, strained vocal quality resulting in  subjective description of whispering. Pt with little audible phonation, inability to project his voice and significant vocal fatigue. Pt's articulatory movement is precise with no slurring observed.   See the above treatment note for details. Pt reports that he has an appt with neurology on January 12th.   OBJECTIVE IMPAIRMENTS include dysarthria, voice disorder, and dysphagia. These impairments are limiting patient from effectively communicating at home and in community and safety when swallowing. Factors affecting potential to achieve goals and functional outcome are medical prognosis, previous level of function,  and severity of impairments. Patient will benefit from skilled SLP services to address above impairments and improve overall function.  REHAB POTENTIAL: Good  PLAN: SLP FREQUENCY: 1-2x/week  SLP DURATION: 12 weeks  PLANNED INTERVENTIONS: Aspiration precaution training, Diet toleration management , Trials of upgraded texture/liquids, Functional tasks, Multimodal communication approach, SLP instruction and feedback, Compensatory strategies, and Patient/family education    Letisia Schwalb B. Rubbie, M.S., CCC-SLP, CBIS Speech-Language Pathologist Certified Brain Injury Specialist [facility]  [facility] [phone] Ascom [phone] Fax [phone]  "

## 2024-09-22 ENCOUNTER — Ambulatory Visit: Admitting: Speech Pathology

## 2024-09-22 ENCOUNTER — Ambulatory Visit: Admitting: Physical Therapy

## 2024-09-22 ENCOUNTER — Ambulatory Visit: Payer: Self-pay | Admitting: Occupational Therapy

## 2024-09-22 DIAGNOSIS — R278 Other lack of coordination: Secondary | ICD-10-CM

## 2024-09-22 DIAGNOSIS — R1312 Dysphagia, oropharyngeal phase: Secondary | ICD-10-CM

## 2024-09-22 DIAGNOSIS — R471 Dysarthria and anarthria: Secondary | ICD-10-CM

## 2024-09-22 DIAGNOSIS — R2681 Unsteadiness on feet: Secondary | ICD-10-CM

## 2024-09-22 DIAGNOSIS — R262 Difficulty in walking, not elsewhere classified: Secondary | ICD-10-CM

## 2024-09-22 DIAGNOSIS — M6281 Muscle weakness (generalized): Secondary | ICD-10-CM

## 2024-09-22 NOTE — Therapy (Signed)
 " OUTPATIENT OCCUPATIONAL THERAPY NEURO TREATMENT NOTE/PROGRESS NOTE UPDATE REPORTING PERIOD FROM 07/20/2024 TO 09/20/2024  Patient Name: Dakota Davis MRN: [id number] DOB:1966/02/03, 59 y.o., male   PCP: Lenon Layman ORN, MD REFERRING PROVIDER: Onita Elspeth Sharper, MD  END OF SESSION:  OT End of Session - 09/22/24 1527     Visit Number 10    Number of Visits 12    Date for Recertification  10/12/24    OT Start Time 1531    OT Stop Time 1617    OT Time Calculation (min) 46 min    Activity Tolerance Patient tolerated treatment well    Behavior During Therapy [facility] for tasks assessed/performed            Past Medical History:  Diagnosis Date   Anxiety    Anxiety    Arthritis    Depression    Hyperlipidemia    Insomnia    Lymphadenopathy, retroperitoneal    OSA (obstructive sleep apnea)    Parkinson disease (HCC)    Sciatica    Seizures (HCC)    Spinal stenosis    Spinal stenosis    Spondylisthesis    Past Surgical History:  Procedure Laterality Date   BACK SURGERY     BACK SURGERY     10/31/2016,12/21/2015   CARDIAC CATHETERIZATION     ESOPHAGOGASTRODUODENOSCOPY N/A 11/27/2021   Procedure: ESOPHAGOGASTRODUODENOSCOPY (EGD);  Surgeon: Toledo, Ladell POUR, MD;  Location: [facility] ENDOSCOPY;  Service: Gastroenterology;  Laterality: N/A;   KNEE ARTHROSCOPY     2018   LEFT HEART CATH AND CORONARY ANGIOGRAPHY N/A 08/05/2017   Procedure: LEFT HEART CATH AND CORONARY ANGIOGRAPHY;  Surgeon: Hester Wolm PARAS, MD;  Location: [facility] INVASIVE CV LAB;  Service: Cardiovascular;  Laterality: N/A;   TONSILECTOMY/ADENOIDECTOMY WITH MYRINGOTOMY     Patient Active Problem List   Diagnosis Date Noted   GAD (generalized anxiety disorder) 07/04/2018   PTSD (post-traumatic stress disorder) 07/04/2018   Stable angina 08/03/2017    ONSET DATE: 09/2020  REFERRING DIAG: Parkinson's disease  THERAPY DIAG:  Other lack of coordination  Muscle weakness (generalized)  Rationale for  Evaluation and Treatment: Rehabilitation  SUBJECTIVE:   SUBJECTIVE STATEMENT:   Pt discussing his medical history and journey, he is hoping to get more of a definitive diagnosis next week when seeing neurologist.  Pt reports continued decline in function, strength.    Pt accompanied by: self  PERTINENT HISTORY: Patient is a 59 year old male with a diagnosis of Parkinson's disease.  Patient was referred to occupational therapy for limited fine motor coordination skills and bilateral hand tremors.  Patient's hand tremors started with the left  hand with a gradual progression to the right hand. [name].  Patient has a recent history of severe right wrist and hand pain and has a upcoming orthopedic consult early next week on 07/25/2024.  Patient reports having a history of a triple cervical fusion last year.  Past medical history includes OSA, major depressive disorder (current), and prediabetes.  PRECAUTIONS: None  WEIGHT BEARING RESTRICTIONS: No  PAIN:  Are you having pain?   No reports of pain  FALLS: Has patient fallen in last 6 months? No  LIVING ENVIRONMENT: Lives with: [name] with wife, [name], and youngest son Lives in: House/apartment Stairs:  two levels, 3 little steps from garage Has following equipment at home: None  PLOF: Independent  PATIENT GOALS:  To improve FMC   OBJECTIVE:  Note: Objective measures were completed  at Evaluation unless otherwise noted.  HAND DOMINANCE: Right  ADLs:  Transfers/ambulation related to ADLs: Independent Eating: Increased time, difficulty at times cutting meat, Independent holding a full drink-two hands initially 2/2 onset of right hand pain Grooming: Independent UB Dressing: Independent LB Dressing: Independent; occasionally has to redo tying shoes. 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 Leisure: Being outside, yard work, reading history research    MOBILITY STATUS: Independent  POSTURE COMMENTS:  Intact  ACTIVITY TOLERANCE: Activity tolerance: Depends on the task he is doing, typically approximately 30 min.  FUNCTIONAL OUTCOME MEASURES: MAM-20: 65/80  09/13/24: MAM-20: 62/80  UPPER EXTREMITY ROM:  *History of left shoulder tears.  Active ROM Right Eval WFL Left Eval [facility]  Shoulder flexion    Shoulder abduction    Shoulder adduction    Shoulder extension    Shoulder internal rotation    Shoulder external rotation    Elbow flexion    Elbow extension    Wrist flexion    Wrist extension    Wrist ulnar deviation    Wrist radial deviation    Wrist pronation    Wrist supination    (Blank rows = not tested)  UPPER EXTREMITY MMT:     MMT Right eval Left eval  Shoulder flexion 5 5  Shoulder abduction 5 5  Shoulder adduction    Shoulder extension    Shoulder internal rotation    Shoulder external rotation    Middle trapezius    Lower trapezius    Elbow flexion 5 5  Elbow extension 5 5  Wrist flexion 4- 5  Wrist extension 4- 5  Wrist ulnar deviation    Wrist radial deviation    Wrist pronation 5 5  Wrist supination 5 5  (Blank rows = not tested)  HAND FUNCTION: Grip strength: Right: 94 lbs; Left: 112 lbs, Lateral pinch: Right: 38 lbs, Left: 37 lbs, and 3 point pinch: Right: 32 lbs, Left: 25 lbs with the Saehan Pinch Meter  08/18/24:  Grip strength: Right: 110 lbs; Left: 121 lbs, Lateral pinch: Right: 24 lbs, Left: 21 lbs, and 3 point pinch: Right: 22 lbs, Left: 19 lbs with the Jamar Pinch  Meter  09/13/24:  Grip strength: Right: 98 lbs; Left: 129 lbs, Lateral pinch: Right: 21 lbs, Left: 22 lbs, and 3 point pinch: Right: 20 lbs, Left: 20 lbs with the Jamar Pinch Meter    COORDINATION: 9 Hole Peg test: Right: 28 sec; Left: 37 sec  09/13/24: 9 Hole Peg test: Right: 26 sec; Left: 27 sec  SENSATION: Light touch: WFL Proprioception: WFL  EDEMA: Intact  MUSCLE TONE: WFL  COGNITION: Overall cognitive status: Patient reports having to concentrate while talking, as he loses his train of thoughts more frequently.  Patient reports concern about the the volume of his voice has changing. Patient also reports changes in moving food through his mouth in preparation for swallowing.  A a referral for speech therapy consult was requested  VISION: Patient reports planning to follow-up with his eye-care physician  TREATMENT DATE: 09/21/2023  Pt seen this date with focus on fine motor coordination tasks with emphasis on manipulation skills, translatory skills of the hand and using  the hand for storage.  Verbal and tactile cues at times for prehension patterns for optimal manipulation skills.   Manipulation of playing cards with thumb and finger combinations with cues.  Manipulation of small glass beads, turning flipping, moving from fingertips to palm and back.  Push pins on moderate resistive board for finger strengthening with place and remove. Handwriting skills this date with name and address with fair to good legibility.  Education details: [facility] skills,  hand strengthening with blue theraputty Person educated: Patient Education method: Explanation, Demonstration, Tactile cues, and Verbal cues Education comprehension: verbalized understanding, returned demonstration, verbal cues required, tactile cues required, and needs further education  HOME EXERCISE PROGRAM:  -Hand strengthening with blue theraputty -Condition management: compensatory strategies for tremors stabilizing  proximally for forearm support for distal control. -bilateral [facility] skills.   GOALS: Goals reviewed with patient? Yes  SHORT TERM GOALS: Target date: 08/31/2024   Patient will be independent with HEPs for BUE functioning. Baseline: 09/13/24: Independent Eval: No Current home exercise program Goal status: Ongoing  LONG TERM GOALS: Target date: 10/12/2024  Patient will independently and efficiently be able to cut meat. Baseline: 09/13/24: Pt. Has a little difficulty cutting meat (Level 3/4 as scored on the MAM-20)  Eval: Patient presents with difficulty cutting meat efficiently Goal status: Ongoing  2.  Patient will independently grasp, and manipulate multiple medication pills efficiently with his right hand Baseline: 09/13/24: Pt. Is is able to grasp pills with his right hand, however requires them to be stabilized between his finger and abdomen in order to reposition them. Eval: Pt. Has difficulty manipulating to pills at a time efficiently in his hand. Goal status: Ongoing  3.  Patient will independently and efficiently be able to clip nails Baseline: 09/13/24: Pt. has a little difficulty completing (As scored as a level 3/4 on the MAM-20) Eval: Patient scored 2-3/4 for cutting nails with a nail clipper portion on the Mam-20 Goal status: improving, ongoing  4.  Patient will grasp 1 item efficiently out of a full pocket Baseline: 09/13/24: Pt. Continues to have difficulty removing one item at a time from a pocket.Eval: Patient has difficulty grasping 1 item efficiently from multiple items in his pocket Goal status: Ongoing  5.  Patient will efficiently write a 3 sentence paragraph with 75% legibility and minimal micrographia  Baseline: 09/13/24: 4 sentences completed in 2 min. & 58 sec. With 75% legibility.  Eval: Name only 50% legibility with moderate micrographia Goal status: Ongoing  6. Pt. Will complete a 5 min. typing test with speed 30 wpm with 98% accuracy.  Baseline:  09/13/2024: 5 min typing test completed with 24 wpm with 96% accuracy.  Goal Status: New  ASSESSMENT:  CLINICAL IMPRESSION:  Pt discussing during treatment he feels he has had a decline in function and has an appt next week with neurology where he hopes to get a more definitive diagnosis.  He reports increased fatigue and decreased strength overall and difficulty with basic household tasks.  Requested to perform sitting tasks this date and focus on fine motor coordination skills.  Will await update from MD regarding diagnosis, prognosis in upcoming appt.  Goals updated to reflect current progress.  Pt responds well to cues for prehension patterns for manipulation skills.  Patient continues to benefit from OT services to work on improving hand function skills in order to maximize overall independence with ADLs and IADL tasks  PERFORMANCE DEFICITS: in functional skills including ADLs, IADLs, coordination, dexterity, ROM, strength, pain, Fine  motor control, and Gross motor control, cognitive skills including attention and memory, and psychosocial skills including coping strategies, environmental adaptation, interpersonal interactions, and routines and behaviors.   IMPAIRMENTS: are limiting patient from ADLs, IADLs, and leisure.   CO-MORBIDITIES: may have co-morbidities  that affects occupational performance. Patient will benefit from skilled OT to address above impairments and improve overall function.  MODIFICATION OR ASSISTANCE TO COMPLETE EVALUATION: Min-Moderate modification of tasks or assist with assess necessary to complete an evaluation.  OT OCCUPATIONAL PROFILE AND HISTORY: Detailed assessment: Review of records and additional review of physical, cognitive, psychosocial history related to current functional performance.  CLINICAL DECISION MAKING: Moderate - several treatment options, min-mod task modification necessary  REHAB POTENTIAL: Good  EVALUATION COMPLEXITY:  Moderate    PLAN:  OT FREQUENCY: 1x a week  OT DURATION: 12 weeks  PLANNED INTERVENTIONS: 97168 OT Re-evaluation, 97535 self care/ADL training, 02889 therapeutic exercise, 97530 therapeutic activity, 97112 neuromuscular re-education, 97140 manual therapy, 97018 paraffin, 02989 moist heat, 97034 contrast bath, 97033 iontophoresis, 97129 Cognitive training (first 15 min), 02869 Cognitive training(each additional 15 min), 02239 Orthotic Initial, 97763 Orthotic/Prosthetic subsequent, energy conservation, coping strategies training, patient/family education, and DME and/or AE instructions  RECOMMENDED OTHER SERVICES: ST  CONSULTED AND AGREED WITH PLAN OF CARE: Patient  PLAN FOR NEXT SESSION: Treatment  Eldo Umanzor T Tanara Turvey, OTR/L, CLT  09/22/2024, 3:32 PM         "

## 2024-09-22 NOTE — Therapy (Signed)
 " OUTPATIENT PHYSICAL THERAPY NEURO treatment    Patient Name: Dakota Davis MRN: [id number] DOB:05/14/1966, 59 y.o., male Today's Date: 09/22/2024   PCP: Lenon Layman ORN, MD  REFERRING PROVIDER: Maree Jannett POUR, MD   END OF SESSION:  PT End of Session - 09/22/24 1622     Visit Number 4    Number of Visits 24    Date for Recertification  11/24/24    PT Start Time 1620    PT Stop Time 1700    PT Time Calculation (min) 40 min    Equipment Utilized During Treatment Gait belt    Activity Tolerance Patient tolerated treatment well    Behavior During Therapy WFL for tasks assessed/performed          Past Medical History:  Diagnosis Date   Anxiety    Anxiety    Arthritis    Depression    Hyperlipidemia    Insomnia    Lymphadenopathy, retroperitoneal    OSA (obstructive sleep apnea)    Parkinson disease (HCC)    Sciatica    Seizures (HCC)    Spinal stenosis    Spinal stenosis    Spondylisthesis    Past Surgical History:  Procedure Laterality Date   BACK SURGERY     BACK SURGERY     10/31/2016,12/21/2015   CARDIAC CATHETERIZATION     ESOPHAGOGASTRODUODENOSCOPY N/A 11/27/2021   Procedure: ESOPHAGOGASTRODUODENOSCOPY (EGD);  Surgeon: Toledo, Ladell POUR, MD;  Location: [facility] ENDOSCOPY;  Service: Gastroenterology;  Laterality: N/A;   KNEE ARTHROSCOPY     2018   LEFT HEART CATH AND CORONARY ANGIOGRAPHY N/A 08/05/2017   Procedure: LEFT HEART CATH AND CORONARY ANGIOGRAPHY;  Surgeon: Hester Wolm PARAS, MD;  Location: [facility] INVASIVE [facility];  Service: Cardiovascular;  Laterality: N/A;   TONSILECTOMY/ADENOIDECTOMY WITH MYRINGOTOMY     Patient Active Problem List   Diagnosis Date Noted   GAD (generalized anxiety disorder) 07/04/2018   PTSD (post-traumatic stress disorder) 07/04/2018   Stable angina 08/03/2017    ONSET DATE: hx of PD. 06/03/2024   REFERRING DIAG: Parkinson's disease without dyskinesia, without mention of fluctuations   THERAPY DIAG:  Dysarthria and  anarthria  Dysphagia, oropharyngeal phase  Muscle weakness (generalized)  Other lack of coordination  Unsteadiness on feet  Difficulty in walking, not elsewhere classified  Rationale for Evaluation and Treatment: Rehabilitation  SUBJECTIVE:                                                                                                                                                                                             SUBJECTIVE STATEMENT:  From Today. Pt arrives  in clinic with walking stick and foot up assist brace Was able to talk to dr Orlando office this AM. Is planning on moving forward with surgery for J tube/Peg placement. Appointment with Dr Loreli on 1/12 and consult with surgery team on 1/13.  Pt reports that he has stopped performing strengthening exercises per PT request. No new s/s reported on this day.      12/31: Pt arrives to PT clinic with walking stick. States that since he was seen 2 prior that his balance has continued to diminish. States that the weakness and numbness are now noted in BLE rather than just RLE.  Also reports that the fasciculations are now present in BLE as well fasciculations in the L side of the lip. All of which are new/worse since PT evaluation on 12/19.   From Evaluation:  Pt is familiar to this clinic for PD management.  Pt reports that he was sitting in hospital waiting room in the September of 2025. Reports that his foot was starting drag on the R side with increased foot drop and has then progressive weakness in the foot and ankle. Also reports that he has experienced significant cramping in the bil calves and sole of the Bil foot. Also also reported increased fasciculations in the calves intermittently and occasionally in the L shoulder with jumping sensation   Reports that he has also experiencing significant difficulty with phonation and breathing. Over the last several months his vocal quality has gotten progressively worse.  Pt  reports that he will have follow-up appointment with MD on January 12th at the latest, but is hoping to get appointment moved forward.    After recent MD appointment for Gastroenterology; which lead to Urgent referral back to Neurology.    Pt accompanied by: self  PERTINENT HISTORY:  PD.  Seizures (HCC)  Spinal stenosis  Spinal stenosis  Spondylisthesis    PAIN:  Are you having pain? No  PRECAUTIONS: Fall  RED FLAGS: Hx of multiple lumbar surgeries.    WEIGHT BEARING RESTRICTIONS: No  FALLS: Has patient fallen in last 6 months? No  LIVING ENVIRONMENT: Lives with: lives with their spouse, lives with their son, and son is special needs  Lives in: House/apartment Stairs: Yes: Internal: 3 steps; none Has following equipment at home: walking stick uses intermittently    PLOF: Independent, Independent with basic ADLs, Independent with gait, Independent with transfers, and is keeping walking stick available in car.    PATIENT GOALS: improve strength in RLE.   OBJECTIVE:  Note: Objective measures were completed at Evaluation unless otherwise noted.  DIAGNOSTIC FINDINGS:   EMG Conclusion: This is an abnormal study. 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 The chronic reinnervation findings in lumbar and in right C8 innervated muscles may reflect chronic right C8 and lumbar radiculopathies in light of the clinical history of degenerative spine disease. There is no robust electrodiagnostic evidence for widespread, progressive lower motor neuron disease. Correlation with spine imaging is recommended.    Face MRI IMPRESSION: No abnormality identified along the course of the trigeminal nerves.  CT   IMPRESSION: 1. Prior C4-C6 ACDF without evidence of hardware complication. No residual high-grade stenosis. 2. Unchanged multilevel cervical spondylosis as described above. Unchanged moderate to severe right  neuroforaminal stenosis at C3-C4. Unchanged mild left neuroforaminal stenosis at C2-C3 and C3-C4. COGNITION: Overall cognitive status: Within functional limits for tasks assessed   SENSATION: Light touch: Impaired  Distal loss  of light touch on the RLE,   COORDINATION: Decreased coordination on the RLE with ankle to knee   EDEMA:  Noted atrophy in the R lower leg.   MUSCLE TONE: RLE: reduced tone on the RLE    POSTURE: forward head and weight shift left  LOWER EXTREMITY MMT:     MMT  Right Eval Left Eval  Hip flexion 3+ 4  Hip extension    Hip abduction 4- 4  Hip adduction 4- 4  Hip internal rotation    Hip external rotation    Knee flexion 4- 5  Knee extension 4- 5  Ankle dorsiflexion 1 4-  Ankle plantarflexion 2 4  Ankle inversion    Ankle eversion 0 4-   (Blank rows = not tested)  LOWER EXTREMITY MMT:    MMT Right Eval Left Eval  Hip flexion    Hip extension    Hip abduction    Hip adduction    Hip internal rotation    Hip external rotation    Knee flexion    Knee extension    Ankle dorsiflexion Trace activation    Ankle plantarflexion    Ankle inversion    Ankle eversion No activation    (Blank rows = not tested)  BED MOBILITY:  Not tested  TRANSFERS: Sit to stand: Complete Independence  Assistive device utilized: None     Stand to sit: Complete Independence  Assistive device utilized: None      RAMP:  Not tested  CURB:  Not tested  STAIRS: Not tested GAIT: Findings: Gait Characteristics: vault on the RLLE , step through pattern, Right foot flat, and poor foot clearance- Right, Distance walked: 48ft, Assistive device utilized:None, Level of assistance: Complete Independence, and Comments: gait deviation as noted.   FUNCTIONAL TESTS:  5 times sit to stand: 13.21; noted lateral bias off of the RLE with sit<>stand. Timed up and go (TUG): TBD 10 meter walk test: 1.15m/s with foot drag intermittently on the RLE Berg Balance Scale:        PATIENT SURVEYS:  To be determined                                                                                                                               TREATMENT DATE: 09/22/2024  Significant Education and discussion on MD follow-up for nutrition support as well as possible follow-up for upper motor/neurologic disease. Education on use of foot up brace and positioning on shoe for greatest   Standing in airex pad:  Narrow stance 2 x 20 seconds Semitandem 2 x 20 sec bil  Forward step with 1 LE to airex pad x 10 bil  Lateral step with 1 LE to airex pad x 10 bil   Gait with foot up brace and no UE support. Significantly improved step height and heel contact on the RLE compared to foot up assist. Was also noted to have reduced SOB with foot up brace compared  to prior PT session for ambulation.   Encouraged pt to continue communication with Neurology team if additional s/s become present.         PATIENT EDUCATION: Education details: continued education on differential diagnosis for PD vs upper/lower motor neuron disease including progression and expectations depending on diagnosis.  Person educated: Patient Education method: Explanation Education comprehension: verbalized understanding  HOME EXERCISE PROGRAM: Access Code: [email or web address] URL: [email or web address] Date: 09/13/2024 Prepared by: Massie Dollar  Exercises - Feet Together Balance at Counter Top Eyes Closed  - 1 x daily - 4 x weekly - 1 sets - 3 reps - 15 sec hold - Semi-Tandem Balance at Counter Top Eyes Closed  - 1 x daily - 4 x weekly - 1 sets - 2 reps - 15 sec  hold - Standing Tandem Balance with Counter Support  - 1 x daily - 4 x weekly - 1 sets - 2 reps - 15 sec hold - Standing Single Leg Stance with Counter Support  - 1 x daily - 4 x weekly - 1 sets - 2 reps - 10 sec  hold - Backward Walking with Counter Support  - 1 x daily - 4 x weekly - 3 sets - 10 reps  GOALS: Goals reviewed with  patient? Yes   SHORT TERM GOALS: Target date: 09/29/2024    Patient will be independent in home exercise program to improve strength/mobility for better functional independence with ADLs. Baseline: to be given  Goal status: INITIAL   LONG TERM GOALS: Target date: 11/24/2024     2.  Patient (> 89 years old) will complete five times sit to stand test in < 11 seconds indicating an increased LE strength and improved balance with symmetrical WB R and L . Baseline: 13.21 Goal status: INITIAL  3.  Patient will increase Berg Balance score by > 6 points to demonstrate decreased fall risk during functional activities Baseline: 52 Goal status: INITIAL  4.  Patient will increase 10 meter walk test to >1.52m/s as to improve gait speed for better community ambulation and to reduce fall risk with appropriate bracing to prevent foot drop. Baseline: 1.68m/s Goal status: INITIAL  5.  Patient will reduce timed up and go to <11 seconds to reduce fall risk and demonstrate improved transfer/gait ability without toe drag.  Baseline: to be completed  Goal status: INITIAL  6.  Patient will increase FGA gait index score by 4 points as to demonstrate reduced fall risk and improved dynamic gait balance for better safety with community/home ambulation.   Baseline: 21 Goal status: INITIAL   ASSESSMENT:  CLINICAL IMPRESSION: Patient is a 59 y.o. male who was seen today for physical therapy for PD and new foot drop. At Evaluation, Pt provided extensive hx of progression of s/s in the RLE/LLE, and occasionally the L shoulder. Noted to have significant fasciculations and cramps in BLE as well as marked weakness in RLE with atrophy and reported profound decline in vocal quality and oral strength over the past several months. Pt is noted to have foot drag on the RLE and significant weakness coordination deficits  in the RLE on assessment. On 12/31 pt reports new numbness and fasiculations on the LLE as well as in  the L side of lip. S/s have continued to today, but no worse than prior session. Pt has obtained foot up brace for Treatment today. Was noted to have reduced foot drag, improved heel contact and reduced SOB with assist of bracing on this day. Gentle  submax balance training completed with multiple rest breaks well without complication. Will have follow-up with neurology on 09/26/24. Pt may benefit from PT to improve balance, strength and coordination or provide education on adaptations based on diagnosis from neurologist.    OBJECTIVE IMPAIRMENTS: Abnormal gait, cardiopulmonary status limiting activity, decreased activity tolerance, decreased balance, decreased coordination, decreased endurance, decreased knowledge of condition, decreased knowledge of use of DME, decreased mobility, difficulty walking, decreased ROM, decreased strength, impaired sensation, impaired tone, and improper body mechanics.   ACTIVITY LIMITATIONS: squatting, stairs, locomotion level, and caring for others  PARTICIPATION LIMITATIONS: laundry, community activity, occupation, yard work, school, and church  PERSONAL FACTORS: Age, Fitness, Social background, Time since onset of injury/illness/exacerbation, and 1-2 comorbidities:   are also affecting patient's functional outcome.   REHAB POTENTIAL: Good  CLINICAL DECISION MAKING: Evolving/moderate complexity  EVALUATION COMPLEXITY: Moderate  PLAN:  PT FREQUENCY: 1-2x/week  PT DURATION: 12 weeks  PLANNED INTERVENTIONS: 97164- PT Re-evaluation, 97750- Physical Performance Testing, 97110-Therapeutic exercises, 97530- Therapeutic activity, [id number]- Neuromuscular re-education, 97535- Self Care, [id number]- Manual therapy, [id number]- Gait training, [phone]- Orthotic Initial, [phone]- Prosthetic Initial , [phone]- Electrical stimulation (unattended), [phone]- Electrical stimulation (manual), Patient/Family education, Balance training, Stair training, Taping, Compression bandaging, DME instructions, and  Wheelchair mobility training  PLAN FOR NEXT SESSION:   Low intensity balance training.  Encourage energy preservation.  Continue AFO ed as needed  Follow up on MD assessment to be completed on 1/12  Massie FORBES Dollar, PT 09/22/2024, 4:24 PM        "

## 2024-09-24 NOTE — Progress Notes (Signed)
 I performed a history and physical examination of Dakota Davis as documented in the resident/fellow/APP note and discussed his management with:  Treatment Team:  Attending Provider: Samual Franky Clara, MD Registered Nurse: Giacolone, Caitlin, RN Registered Nurse: Mallory Breed, Aloysius Mighty, RN Resident: Leotha Mabel Brasil, MD Resident: Delon Sar, MD   I agree with the history, physical, assessment, and plan of care, with the following exceptions: None  Medical Decision Making Patient seen and examined. 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 Notes progressive symptoms - notes his PTOT were concerned over his interval change in condition - notes progressive dysphagia and dyspnea on exertion. Notes atrophy, muscle spasm especially in right lower extremity. He is followed by neurology here and has appointment on Monday, notes next week an appointment for GJ tube placement due to his increased aspiration risk. 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 Will check NIF, blood gas. Obtain CXR, EKG, bnp, troponin to rule out alternate causes. Neuro consulted, patient has expressed disinterest in admission. If his dyspnea work-up here is reassuring, I think discharge home is probably safe. Plan of care signed out.   Amount and/or Complexity of Data Reviewed External Data Reviewed: notes. Labs:  Decision-making details documented in ED Course. Radiology: ordered and independent interpretation performed. Decision-making details documented in ED Course. ECG/medicine tests: ordered and independent interpretation performed. Decision-making details documented in ED Course.     Dakota Davis

## 2024-09-25 NOTE — Consults (Signed)
 Nutrition Initial Assessment  Reason for visit: MD consult regarding: ongoing workup severe weight loss In the past two months, have you lost 10 pounds or more without trying?  Yes (Comment) (difficulty chewing) For two or more weeks, have you been eating less than your normal amount? Yes (Comment)   Admission Diagnosis / Current Problem: 59 year old with a significant history of ACDF and lumbar fusion, anxiety and depression, hypertension, hyperlipidemia, factor V Leiden, possible Parkinson's Disease presenting for expedited workup for progressive symptoms of dysphagia, dysphonia, extremity weakness and weight loss, with first symptoms starting in 2022.   PMH:  Past Medical History:  Diagnosis Date   Anxiety 1999   Arthritis    Bilateral sciatica 12/21/2015   COVID-19 10/2018   Degeneration of lumbar intervertebral disc 12/21/2015   Depression    Insomnia    Obesity (BMI 30-39.9), unspecified    OSA (obstructive sleep apnea)    Diagnosed many years ago but was not able to use CPAP    Osteoarthritis 12/21/2015   Pure hypercholesterolemia    Retroperitoneal lymphadenopathy 2017   Found incidentally.  Suggest repeat CT in 1 yr for further eval.   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  Last seen in 2016 by neuro.   Tremor 2022   Nutrition Assessment: Anthropometrics  Admit Height: 180.3 cm (5' 11) (09/24/24 1541)  Admit Weight: 94 kg (207 lb 3.7 oz) (09/24/24 1541) Admit BMI: 28.9 kg/m   Usual body weight:   ~250 lbs?  Weight History: Wt Readings from Last 20 Encounters:  09/24/24 94 kg (207 lb 3.7 oz)  08/01/24 94.8 kg (209 lb)  07/26/24 96 kg (211  lb 9.6 oz)  07/25/24 96.2 kg (212 lb)  07/12/24 98 kg (216 lb)  05/09/24 (!) 104.3 kg (230 lb)  03/07/24 (!) 108.6 kg (239 lb 6.4 oz)  10/22/23 (!) 117.5 kg (259 lb)  09/03/23 (!) 115.7 kg (255 lb)  06/30/23 (!) 127.1 kg (280 lb 3.2 oz)  05/27/23 (!) 122.5 kg (270 lb)  02/23/23 (!) 109.8 kg (242 lb)  10/24/22 (!) 111.6 kg (246 lb)  10/17/22 (!) 111.8 kg (246 lb 6.4 oz)  10/14/22 (!) 111.6 kg (246 lb)  09/26/22 (!) 111.6 kg (246 lb)  09/12/22 (!) 111.6 kg (246 lb)  08/21/22 (!) 111.6 kg (246 lb)  06/26/22 (!) 105.1 kg (231 lb 12.8 oz)  03/26/22 (!) 124.7 kg (275 lb)   % Weight change: 22.9% weight loss x 1 year, clinically significant  Weights this admission: see admit wt  Nutrition History:   Food Allergies: None per chart Intake:  UTA though suspect chronically poor. Per chart review primarily consuming ice cream, milkshakes, oatmeal   Current Nutrition: Diet Order:  Diet dysphagia IDDSI L7E easy to chew L0 Thin Liquids  Intake:   Date/Time Percentage of meal eaten Meal Nutritional supplement (mL)  09/25/24 0900 76-100% Breakfast --                  Nutrition-Focused Findings: Physical Assessment: deferred (09/25/24 1200) -pt unavailable/ not in room        Last BM:  (PTA) Skin: Intact Edema: None noted   Biochemical Data and Medication (nutritionally relevant): Labs:  Recent Labs  Lab 09/23/24 1715 09/24/24 1723 09/25/24 0944  NA 138 139 137  K 3.8  --  3.7  CL 102 102 102  CO2 27 26 28   BUN 8 10 8   CREATININE 1.1 1.2 1.1  GLUCOSE 88 92 100  CALCIUM 9.3 9.6 9.1   No results for input(s): MG in the last 168 hours. Lab Results  Component Value Date   HGBA1C 5.4 04/26/2024   HGBA1C 5.8 (H) 05/27/2023   No results for input(s): POCGLU in the last 72 hours. Lab Results  Component Value Date   FOLATE 11.4 05/27/2023   VITB12 292 09/24/2024    Medications:  Protonix   Estimated Nutritional Needs:  Calculation Weight Used: Admission  weight Energy: 2350 - 2820 kcal (25 - 30 kcal/kg) Protein:  94-113 gm (1 - 1.2 gm/kg) Fluid:  Per team  Diet recommendation 09/25/24: IDDSI L7e - Easy to Chew  diet with L0 Thin Liquids    Nutrition Evaluation: Pt due for nutrition consult regarding concern for severe weight loss. Presents with dysphagia and difficulty chewing/ chewing fatigue. Severe weight loss noted, meeting criteria for mild protein-calorie malnutrition in the context of chronic illness. Appears to have eaten well this morning and has been cleard for IDDSI L7E Easy to chew diet by SLP. May benefit from high calorie ONS to assist in meeting nutrition needs without having to rely on chewing. Further recommendations below:   Nutrition Diagnosis: -NI-1.5 Excessive energy intake related to dysphagia, difficulty chewing as evidenced by severe weight loss  Malnutrition Assessment: Diagnosis: mild protein-calorie malnutrition (09/25/24 1200) in the context of chronic illness Criteria: >20% unintentional weight loss in 12 months Intervention: Recommend oral nutrition;Recommend oral nutrition supplements;Recommend trend weight status;Recommend vitamin/mineral supplementation (09/25/24 1200)  Nutrition Recommendations:  Diet per SLP ONS: Boost VHC with meals, Magic Cup with meals Standing weights 1-2x/week to trend Daily MVI    Assessed at: 1227  Time Spent: 50 Minute(s)  ANNA ARBY GOLDMANN, MS, RD, LDN

## 2024-09-25 NOTE — Progress Notes (Signed)
 "  [name]  Physical Therapy Initial Evaluation  Patient Name:  Dakota Davis Date of Evaluation: 09/25/24 Time of Evaluation:  1102 Duration of Session:  26 Minutes Room/Bed: 9A07/9A07-01  Precautions: Falls Risk   Reason for Admission: Per medical record, Dakota Davis is a 59 y.o. male admitted on 09/24/2024 with a significant history of ACDF and lumbar fusion, anxiety and depression, hypertension, hyperlipidemia, factor V Leiden, possible Parkinson's Disease presenting for expedited workup for progressive symptoms of dysphagia, dysphonia, extremity weakness and weight loss, with first symptoms starting in 2022.  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  Assessment: Patient is a 59 yo male admitted with muscle weakness with progressive symptoms of dysphagia, dysphonia, extremity weakness and weight loss. Patient presents with mild RLE weakness, but is independent with all bed mobility, functional transfers and ambulation. Patient does not have any further acute PT needs and is cleared for discharge home from PT perspective when medically cleared. Patient tolerated session well. Patient will benefit from ongoing outpatient PT to progress strengthening, balance, endurance, patient education/safety, and functional mobility. PT signing off, please re-consult with any change in status.  Recommendations for mobility with nursing:  Patient is safe to walk independently Note: Use BMAT score and associated clinical judgement to determine safe mobility on a daily basis as patient status may be subject to change.   Discharge Recommendations: Is the patient safe to discharge to the recommended disposition? Yes Discharge Recommendations: Home, Outpatient PT DME Recommendations    Flowsheet Row Most Recent Value  DME Recommendations None Filed at 09/25/2024 1102    Complete details of today's session: Chart reviewed and patient cleared for PT by RN. Patient presents seated at EOB with family present, willing to work with PT. Mobility performed as detailed below. Briefly, patient participated in functional transfers and ambulation independently, with no further acute PT needs identified. At end of session, the patient was left seated at edge of bed, with family present, with all needs in reach, with nurse call device in reach. His status was communicated to the RN, Patient, Family.   Please see below flowsheet for complete details of session and mobility.    09/25/24 1102  Discipline Timestamp  Discipline Timestamp PT  Documentation Type     Documentation Type                                E,R, T   Treatment  Patient Subjective Information  Patient Subjective Information Patient agreeable to therapy  Patient Profile/Social History  Social history source                                Patient  Patient lives                           with spouse;with adult child(ren)  Admitted from Home  Number of falls in the last 3 months 0  Prior Level of Function Independent household mobility without an assistive device;Community mobility  Assistive device for community mobility [employer] type other (comment) (Walking stick)       Assistance needed for community mobility Modified independent  Home Activity / Exercise Yes       Comment Working with PT for last 3 weeks  Assistance received prior to admission No   Assistance available after discharge Family;As needed  Home Environment  Type of Home House  Home Layout Two level;Able to live on main level with bedroom/bathroom;Stairs to enter with rails  Access Exterior stairs       Exterior stairs - number of steps 3       Exterior stairs - rails None  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 No  Review of Systems - Cognition                         Arousal/Alertness Alert  Orientation X 4  Attention Span Appears intact  Following Commands Consistently  Behavior Cooperative;Motivated  Safety Judgment Fully aware of deficits;Family present  Communication WNL (Hoarse)  Review of Systems- Musculoskeletal  RLE Assessment WFL  LLE Assessment WFL  Additional Comments 2+/5 R ankle DF  Review of Systems-Neuromuscular                    Sensation No deficits  Balance  Sitting/Standing Balance  Sitting Static;Standing Dynamic       Sitting Balance Surface Hospital Bed       Static Sitting Support Needed No UE support;Feet supported       Static Sitting Balance Assist Required Independent       Dynamic Standing Activities  (Ambulation)       Dynamic Standing Assistance Independent  Mobility  Bed Mobility  Supine to Sit;Sit to Stand       Supine to Sit Assistance Independent       Supine to Sit Details Set up       Sit to Stand Assistance Independent       Sit to Stand Details Set up  Ambulation Yes       Ambulation Assistance Independent  Distance ambulated in feet 240  feet  Ambulation Assistive Device None      Other Apparatus   (Patient has Foot Up Brace)   Number of rest breaks 0  Gait Pattern Foot drop, right  Gait Speed Number of Meters Walked 7.5  Gait Speed Number of Seconds Walked 9.76 seconds  Gait Speed (m/sec)  0.77 m/sec  Adult PT Outcomes  Highest Level of Function Yes  Highest Level of Activity 11- Walking wihtout gait aid and no assist  Time in Highest Level of Activity 1-5 min  Adverse Events 0- None  Inpatient AM-PAC Performed Basic Mobility Inpatient Short Form - 6 Clicks  AM-PAC 6 Clicks Basic Mobility Inpatient Short Form  Turning from your back to your side while in a flat bed without using bedrails? 4-None  Moving from lying on your back to sitting on the side of a  flat bed without using bedrails? 4-None  Moving to and from a bed to a chair (including a wheelchair)? 4-None  Standing up from a chair using using your arms (e.g,. wheelchair, or bedside chair)? 4-None  To walk in hospital room? 4-None  Climbing 3-5 steps with a railing? 4-None  AM-PAC Basic Mobility Raw Score 24  AM-PAC Basic Mobility t-Scale Score 57.68  AM-PAC Basic Mobility G-Code Modifier CH  Patient Status At End of Session  Status Communicated to: RN;Patient;Family  Pt Left: seated at edge of bed;with family present;with all needs in reach;with nurse call device in reach  Assessment  PT Enhancers Good family support/resources;Motivated;Intact cognition;Previous level of function/active lifestyle  PT Barriers Low activity tolerance;Decreased activity tolerance/medically complex/comorbidities  Impairments/Functional Limitations    Impaired functional mobility;Decreased balance;Decreased strength;Decreased endurance/activity tolerance;Gait/Ambulation limitations  Rehab Potential   Good  Patient safe for DC/PT perspective? Yes  Summary of Findings  Safe to return home;Tolerated treatment well;No acute therapy needs identified, discharge therapy;Notified  provider that patient is ready for discharge from PT perspective  Plan  Treatment/Interventions None indicated  PT Frequency Evaluation only;Patient on HOLD  Discharge Recommendation ([facility]/DR[facility]) Home;Outpatient PT  DME Recommendations None  Plan(Progress Note) Discontinue physical therapy    Please see patient education record for PT teaching completed today.  Brookie Bars (PT Student)     ------------------------------------------------------------------------------- Attestation signed by Gerome Othel Odor, PT at 09/25/2024  2:24 PM I certify that I was present for the entirety of the session, directed the treatment of the patient, and agree with the documentation as written.  Electronically Co-Signed by: OTHEL RHO, PT, DPT, NCS Pager: [phone]  ------------------------------------------------------------------------------- "

## 2024-09-25 NOTE — Progress Notes (Signed)
 "  Clinical Swallow Evaluation  Location: Inpatient / 9A07/9A07-01 Time of Service: 1000-1027 Verification from Source Document: 1. Dakota Davis's name 2. DOB 3.Correct Procedure Falls Risk: (not recorded) Reason for Consult: To evaluate swallow function  Medical History   Admission Date: 09/24/2024  Current Medical History: Dakota Davis is a 59 y.o. male with a significant history of ACDF and lumbar fusion, anxiety and depression, hypertension, hyperlipidemia, factor V Leiden, possible Parkinson's Disease presenting for expedited workup for progressive symptoms of dysphagia, dysphonia, extremity weakness and weight loss   Relevant Past Medical History:  Past Medical History:  Diagnosis Date   Anxiety 1999   Arthritis    Bilateral sciatica 12/21/2015   COVID-19 10/2018   Degeneration of lumbar intervertebral disc 12/21/2015   Depression    Insomnia    Obesity (BMI 30-39.9), unspecified    OSA (obstructive sleep apnea)    Diagnosed many years ago but was not able to use CPAP    Osteoarthritis 12/21/2015   Pure hypercholesterolemia    Retroperitoneal lymphadenopathy 2017   Found incidentally.  Suggest repeat CT in 1 yr for further eval.   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  Last seen in 2016 by neuro.   Tremor 2022   Dysphagia   Baseline diet: Regular diet with Thin Liquids (L0); soft diet for the past year Dysphagia screen: Pass (09/24/24 2000) Current diet: Diet dysphagia IDDSI L7E easy to chew L0 Thin Liquids Alternate means of nutrition present: No Current swallowing difficulties: Dakota Davis endorses a hx of dysphagia that has worsened in the past  year. Pt's primary complaint is that he gets significantly fatigued when eating/chewing which has resulted in a weight loss of 50 lbs. Pt previously had trouble with choking which was resolved with esophageal dilation (x2). Pt says he is primarily eating ice cream and oatmeal.  Date of onset: worsened in the past year Prior Speech Pathology intervention: Dakota Davis with prior SLP services as follows: Dakota Davis reports they were recently seen by SLP at [facility] and underwent a MBSS; 08/26/2023 which evidenced no aspiration.  Food Allergies: No known food allergies Dakota Davis able to have PO for evaluation: Yes, active diet orders    Evaluation Environment   Level of Alertness: Awake and alert Pain Score: No pain / none reported Communication Status: Low volume/reduced respiratory drive during speech Hearing: No documented or reported audiology evaluation Vision: Wears glasses/contacts, which were present Interpreter Services: N/A, no interpreter needed Safety/Restraints: N/A   [facility]   Present in today's session: Spouse Dakota Davis/family/medical team priorities for this session: To evaluate swallowing  Comments: N/A   Objective   Respiratory Status: Room air  Oral Motor Examination:   Structure: unremarkable Focused Cranial Nerve Exam: Trigeminal Nerve (V): symmetric jaw opening, sensation intact to light touch  Facial Nerve (VII): No noted abnormalities  Vagus Nerve (X): unable to visualize palatal elevation; ?impairment Hypoglossal Nerve (XII): suspected atrophy    Laryngeal Function Vocal Quality: Dysphonia  - reduced respiratory drive  Cough: Appeared weak    Oral Health: Dentition: Natural Oral and lingual mucosa: No abnormalities noted  Secretion management: Managing secretions Oral care completed: No  Oral Feeding Trials:  Positioning: Upright in bed Feeding assistance:  Self feeding  Consistencies Administered: Thin liquid (L0), Puree, and Soft/bite-sized   Oropharyngeal Findings: slowed mastication reported inability to coordinate serial sips of liquid Clinical Signs of Aspiration: no clinical signs of aspiration   Esophageal Stage: Limited evaluation No overt clinical signs/symptoms of dysfunction with accepted consistencies 90 mL water test Genice, 2008): Fail due to inability to drink full amount consecutively  Therapeutic Interventions: Not warranted      Assessment   ICD-10 code(s): R13.12 (Oropharyngeal dysphagia)  Dakota Davis, Dakota Davis, presents with reports of 2 years of progressive dysphagia with current emphasis on the oral phase. Dakota Davis reports difficulty with oral coordination and prolonged mastication causing significant fatigue. Dakota Davis's PO intake has significantly declined in the past year d/t the fatigue related with eating. Dakota Davis is currently undergoing work-up including ALS work up to determine etiology of his symptoms. Additional speech related symptoms include dysphonia/reduced respiratory drive, and increased WOB. Dakota Davis has a hx of ACDF and esophageal dilations. He reports a recent VFSS on 08/26/23 at outside hospital with no evidence of aspiration. No overt signs concerning for aspiration with trials of soft solids, puree, or thin liquids. He did demonstrate prolonged oral phase for soft solids, and difficulty with coordination when cued to take serial sips of liquids. Per conversation with Dakota Davis, will plan to continue IDDSI L7e diet at this time with the option to upgrade to regular should he wish to have additional choices. Speech to follow for ongoing work up.   Prognosis: Pending medical prognosis for improved function with skilled Speech Pathology services focusing on stated goals. Prognostic indicators include: motivation and family support  Dakota Davis Education was provided via verbal communication re: role of SLP, diet recommendations, and plan of care.  Dakota Davis and Family verbally expressed/demonstrated understanding.   Barriers to Dakota Davis education include: none. Will continue to provide education in subsequent sessions.   Results and recommendations of this evaluation were communicated to: N/A, no significant changes   Plan / Recommendations   Diet recommendation: IDDSI L7e - Easy to Chew  diet with L0 Thin Liquids  Ice chips: Ok for ice chips as tolerated PO medications: Whole with thin liquid Precautions/strategies:  General aspiration precautions (sit upright and complete routine oral care) Please consider: Ordering: Motor Speech Evaluation (pending work up, may consider trialing EMST if appropriate).    Speech Pathology services: 3 time/s a week for 1 month/s for  dysphagia intervention  Discharge recommendations: Pending clinical presentation in future sessions  Long Term Goal(s): Dakota Davis will return to Regular with L0 Thin Liquids after demonstrating ability to implement compensatory swallowing strategies by 12/24/2024.  Short Term Goals: Goal: To determine readiness for diet upgrade, Dakota Davis will demonstrate functional mastication, anterior-posterior transit and oral clearance with hard solids across 5/5 trials by 10/02/2024.  Progress: n/a   2. Dakota Davis will complete the Eating Assessment Tool (EAT-10) or the Swallowing Quality of Life Survey (SWAL-QOL) as a baseline of perceived impairment by 10/06/24.   Progress: n/a  Thank you for this referral.  NICOLE   BADGLEY, CCC-SLP  "

## 2024-09-26 NOTE — Care Plan (Signed)
 Pt remains safe and daily care needs met at this time.  Pt encouraged to use call bell to request any help from staff   Problem: Safety Goal: Free from accidental physical injury Outcome: Progressing   Problem: Daily Care Goal: Daily care needs are met Description: Assess and monitor ability to perform self care and identify potential discharge needs. Outcome: Progressing

## 2024-09-27 ENCOUNTER — Ambulatory Visit: Admitting: Speech Pathology

## 2024-09-27 ENCOUNTER — Ambulatory Visit: Admitting: Physical Therapy

## 2024-09-27 ENCOUNTER — Ambulatory Visit: Payer: Self-pay | Admitting: Occupational Therapy

## 2024-09-29 ENCOUNTER — Ambulatory Visit: Admitting: Physical Therapy

## 2024-09-29 ENCOUNTER — Ambulatory Visit: Payer: Self-pay | Admitting: Occupational Therapy

## 2024-09-29 ENCOUNTER — Ambulatory Visit: Admitting: Speech Pathology

## 2024-10-03 ENCOUNTER — Ambulatory Visit

## 2024-10-04 ENCOUNTER — Ambulatory Visit: Admitting: Speech Pathology

## 2024-10-04 ENCOUNTER — Ambulatory Visit

## 2024-10-04 ENCOUNTER — Ambulatory Visit: Payer: Self-pay | Admitting: Occupational Therapy

## 2024-10-06 ENCOUNTER — Ambulatory Visit: Payer: Self-pay | Admitting: Occupational Therapy

## 2024-10-06 ENCOUNTER — Ambulatory Visit: Admitting: Speech Pathology

## 2024-10-11 ENCOUNTER — Ambulatory Visit: Admitting: Physical Therapy

## 2024-10-11 ENCOUNTER — Ambulatory Visit: Admitting: Speech Pathology

## 2024-10-11 ENCOUNTER — Ambulatory Visit: Payer: Self-pay | Admitting: Occupational Therapy

## 2024-10-13 ENCOUNTER — Ambulatory Visit: Admitting: Psychiatry

## 2024-10-13 ENCOUNTER — Ambulatory Visit: Payer: Self-pay | Admitting: Occupational Therapy

## 2024-10-13 ENCOUNTER — Ambulatory Visit: Admitting: Speech Pathology

## 2024-10-14 ENCOUNTER — Ambulatory Visit: Admitting: Physical Therapy

## 2024-10-14 ENCOUNTER — Ambulatory Visit

## 2024-10-14 ENCOUNTER — Ambulatory Visit: Admitting: Speech Pathology

## 2024-10-18 ENCOUNTER — Ambulatory Visit: Payer: Self-pay | Admitting: Occupational Therapy

## 2024-10-18 ENCOUNTER — Ambulatory Visit

## 2024-10-18 ENCOUNTER — Ambulatory Visit: Admitting: Speech Pathology

## 2024-10-20 ENCOUNTER — Ambulatory Visit

## 2024-10-20 ENCOUNTER — Ambulatory Visit: Admitting: Speech Pathology

## 2024-10-20 ENCOUNTER — Ambulatory Visit: Payer: Self-pay

## 2024-10-25 ENCOUNTER — Ambulatory Visit

## 2024-10-25 ENCOUNTER — Ambulatory Visit: Admitting: Speech Pathology

## 2024-10-25 ENCOUNTER — Ambulatory Visit: Payer: Self-pay | Admitting: Occupational Therapy

## 2024-10-27 ENCOUNTER — Ambulatory Visit: Payer: Self-pay | Admitting: Occupational Therapy

## 2024-10-27 ENCOUNTER — Ambulatory Visit: Admitting: Speech Pathology

## 2024-10-27 ENCOUNTER — Ambulatory Visit

## 2024-11-01 ENCOUNTER — Ambulatory Visit: Admitting: Speech Pathology

## 2024-11-01 ENCOUNTER — Ambulatory Visit

## 2024-11-01 ENCOUNTER — Ambulatory Visit: Admitting: Occupational Therapy

## 2024-11-03 ENCOUNTER — Ambulatory Visit: Admitting: Occupational Therapy

## 2024-11-03 ENCOUNTER — Ambulatory Visit

## 2024-11-03 ENCOUNTER — Ambulatory Visit: Admitting: Speech Pathology

## 2024-11-08 ENCOUNTER — Ambulatory Visit: Admitting: Speech Pathology

## 2024-11-08 ENCOUNTER — Ambulatory Visit

## 2024-11-08 ENCOUNTER — Ambulatory Visit: Admitting: Occupational Therapy

## 2024-11-10 ENCOUNTER — Ambulatory Visit

## 2024-11-10 ENCOUNTER — Ambulatory Visit: Admitting: Occupational Therapy

## 2024-11-10 ENCOUNTER — Ambulatory Visit: Admitting: Speech Pathology

## 2024-11-15 ENCOUNTER — Ambulatory Visit: Admitting: Physical Therapy

## 2024-11-15 ENCOUNTER — Ambulatory Visit: Admitting: Occupational Therapy

## 2024-11-15 ENCOUNTER — Ambulatory Visit: Admitting: Speech Pathology

## 2024-11-17 ENCOUNTER — Ambulatory Visit: Admitting: Occupational Therapy

## 2024-11-17 ENCOUNTER — Ambulatory Visit: Admitting: Physical Therapy

## 2024-11-17 ENCOUNTER — Ambulatory Visit: Admitting: Speech Pathology

## 2024-11-22 ENCOUNTER — Ambulatory Visit: Admitting: Occupational Therapy

## 2024-11-22 ENCOUNTER — Ambulatory Visit: Admitting: Psychiatry

## 2024-11-22 ENCOUNTER — Ambulatory Visit: Admitting: Speech Pathology

## 2024-11-22 ENCOUNTER — Ambulatory Visit: Admitting: Physical Therapy

## 2024-11-24 ENCOUNTER — Ambulatory Visit: Admitting: Speech Pathology

## 2024-11-24 ENCOUNTER — Ambulatory Visit: Admitting: Occupational Therapy

## 2024-11-24 ENCOUNTER — Ambulatory Visit: Admitting: Physical Therapy

## 2024-11-29 ENCOUNTER — Ambulatory Visit: Admitting: Speech Pathology

## 2024-11-29 ENCOUNTER — Ambulatory Visit: Admitting: Physical Therapy

## 2024-11-29 ENCOUNTER — Ambulatory Visit: Admitting: Occupational Therapy

## 2024-12-01 ENCOUNTER — Ambulatory Visit: Admitting: Occupational Therapy

## 2024-12-01 ENCOUNTER — Ambulatory Visit

## 2024-12-01 ENCOUNTER — Ambulatory Visit: Admitting: Speech Pathology
